# Patient Record
Sex: Male | Born: 1950 | Race: White | Hispanic: No | Smoking: Current every day smoker
Health system: Southern US, Community
[De-identification: ages and names within clinical notes are randomized; demographics above are authoritative.]

## PROBLEM LIST (undated history)

## (undated) DIAGNOSIS — I10 Essential (primary) hypertension: Secondary | ICD-10-CM

---

## 2004-08-29 ENCOUNTER — Ambulatory Visit: Payer: Self-pay | Admitting: Cardiology

## 2005-07-30 ENCOUNTER — Ambulatory Visit: Payer: Self-pay | Admitting: Cardiology

## 2005-07-30 ENCOUNTER — Inpatient Hospital Stay (HOSPITAL_COMMUNITY): Admission: AD | Admit: 2005-07-30 | Discharge: 2005-08-01 | Payer: Self-pay | Admitting: Internal Medicine

## 2007-01-07 ENCOUNTER — Inpatient Hospital Stay (HOSPITAL_COMMUNITY): Admission: RE | Admit: 2007-01-07 | Discharge: 2007-01-09 | Payer: Self-pay | Admitting: Neurosurgery

## 2010-09-17 NOTE — Op Note (Signed)
[name], [name] NO.:  [id number]   MEDICAL RECORD NO.:  [id number]          PATIENT TYPE:  INP   LOCATION:  3027                         FACILITY:  [facility]   PHYSICIAN:  Cristi Loron, M.D.DATE OF BIRTH:  [birth year]   DATE OF PROCEDURE:  01/07/2007  DATE OF DISCHARGE:  01/09/2007                               OPERATIVE REPORT   BRIEF HISTORY:  The patient is a 60 year old white male who suffered  from neck, right shoulder, and right arm pain.  He failed medical  management and was worked up with a cervical and shoulder MRI. The  shoulder MRI demonstrated he had a severe rotator cuff tear and he saw  an orthopedic surgeon.  The patient also had neck pain and radicular arm  pain which seemed consistent with a cervical radiculopathy.  He had an  MRI scan which demonstrated spondylosis and stenosis at C5-C6 and C6-C7.  I discussed the various treatment options with him including surgery.  The patient has weighed the risks, benefits and alternatives and decided  to proceed with a C5-C6 and C6-C7 anterior cervical discectomy, fusion,  and plating.   PREOPERATIVE DIAGNOSIS:  C5-C6 and C6-C7 degenerative disc disease,  spondylosis, stenosis, cervical radiculopathy, cervical myelopathy,  cervicalgia.   POSTOPERATIVE DIAGNOSIS:  C5-C6 and C6-C7 degenerative disc disease,  spondylosis, stenosis, cervical radiculopathy, cervical myelopathy,  cervicalgia.   PROCEDURE:  C5-C6 and C6-C7 extensive anterior cervical  discectomy/decompression; insertion of C5-C6 and C6-C7 interbody  prosthesis (Alphatec PEEK interbody prosthesis); C5-C6 and C6-C7  interbody local morselized autograft arthrodesis; anterior cervical  plate C5 to C7 with Codman Slimlock titanium plate and screws.   SURGEON:  Cristi Loron, M.D.   ASSISTANT:  Hilda Lias, M.D.   ANESTHESIA:  General endotracheal anesthesia.   ESTIMATED BLOOD LOSS:  100 mL.   SPECIMENS:  None.   DRAINS:   None.   COMPLICATIONS:  None.   DESCRIPTION OF PROCEDURE:  The patient was brought to the operating room  by the anesthesia team.  General endotracheal anesthesia was induced.  The patient remained in the supine position.  A roll was placed under  his shoulders to place his neck in slight extension.  His anterior  cervical region was then prepared with Betadine scrub and Betadine  solution.  Sterile drapes were applied.  I then injected the area to be  incised with Marcaine with epinephrine solution and used a scalpel to  make a transverse incision in the patient's left anterior neck.  I used  the Metzenbaum scissors to divide the platysma muscle and then to  dissect medial to the sternocleidomastoid muscle, jugular vein and  carotid artery.  I carefully dissected down towards the anterior  cervical spine carefully identifying the esophagus and retracting it  medially.  We then used the Kitner swabs to clear the soft tissue from  the anterior cervical spine and then inserted a bent spinal needle into  upper exposed intervertebral disc space.  We obtained an intraoperative  radiograph to confirm our location.   We then used electrocautery to detach the medial border of  the longus  colli muscle bilaterally from C5-C6 and C6-C7 intervertebral disc space.  We inserted the Caspar self-retaining retractor for exposure and then  incised the C6-C7 intervertebral disc and performed a partial  intervertebral discectomy using pituitary forceps and Carlen curets.  I  then inserted distraction screws into C6-C7, distracted the interspace,  and then used a high speed drill to decorticate the vertebral endplates  at C6-C7, drill away the remainder of the C6-C7 intervertebral disc,  drill away some posterior spondylosis, and then to thin out the  posterior longitudinal ligament.  We then incised the ligament with the  arachnoid knife and removed it with the Kerrison punch undercutting the  vertebral  endplates at C6-C7 decompressing the thecal sac.  We then  performed foraminotomies about the bilateral C7 nerve root completing  the decompression.   We then repeated this procedure in analogous fashion at C5-C6  decompressing the thecal sac and bilateral C6 nerve roots.   Having completed the decompression, we now turned attention to  arthrodesis.  We used the trial spacers and determined to use the  Alphatec medium PEEK interbody spacers.  We selected the appropriate  height.  We prefilled these spacers with a combination of local  autograft bone we obtained during the decompression as well as the  VITOSS graft extender.  We then inserted the prosthesis, distracted the  C6-C7 and C5-C6 interspaces, then removed the distraction screws.  There  was a good snug fit of the prosthesis at both levels.  This completed  the arthrodesis.   We now turned attention to the anterior spinal instrumentation.  We used  a high speed drill to remove some ventral spondylosis from the vertebral  endplates at C5-C6 and C6-C7 so that the plate would lay down flat.  We  selected the appropriate length Codman Slimlock anterior cervical plate  and laid it along the anterior aspects of the vertebral bodies of C5  through C7.  We then drilled two 14 mm drill holes in C5, C6, and C7,  and then secured the plate to the vertebral bodies by placing two 14 mm  self-tapping screws at C5, C6 and C7.  I then obtained an intraoperative  radiograph.  There was limited visualization because of the patient's  body habitus but they looked good en vivo.  We, therefore, secured the  screws to the plate by locking each cam.  This completed the  instrumentation.   We then obtained hemostasis using bipolar cautery, irrigated the wound  out with bacitracin solution, removed the retractor, and then inspected  the esophagus for any damage and there was none apparent.  We then  reapproximated the patient's platysma muscle with  interrupted 3-0 Vicryl  suture, subcutaneous tissue with interrupted 3-0 Vicryl suture and the  skin with Steri-Strips and Benzoin.  The wound was then coated with  bacitracin ointment.  A sterile dressing was applied.  The drapes were  removed.  The patient was subsequently extubated by the anesthesia team  and transported to the post anesthesia care unit in stable condition.  All sponge, instrument and needle counts were correct at the end of the  case.      Cristi Loron, M.D.  Electronically Signed     JDJ/MEDQ  D:  01/09/2007  T:  01/09/2007  Job:  [id number]

## 2010-09-20 NOTE — Discharge Summary (Signed)
[name], [name] NO.:  [id number]   MEDICAL RECORD NO.:  [id number]          PATIENT TYPE:  INP   LOCATION:  3027                         FACILITY:  [facility]   PHYSICIAN:  Cristi Loron, M.D.DATE OF BIRTH:  1951-01-25   DATE OF ADMISSION:  01/07/2007  DATE OF DISCHARGE:  01/09/2007                               DISCHARGE SUMMARY   BRIEF HISTORY:  The patient is a 60 year old white male who has suffered  from neck, right shoulder, and arm pain.  He failed medical management  and worked up with a cervical MRI and shoulder MRI.  The shoulder MRI  demonstrated severe rotator cuff tear and he saw an orthopedic surgeon.  The patient has also had neck pain with radicular pain, which was  consistent with a cervical radiculopathy.  He had an MRI scan, which  demonstrated spondylosis and stenosis C5-6 and C6-7.  I discussed  various treatments options with the patient including surgery.  The  patient has weighed the risks, benefits, and alternatives of the surgery  and has decided to proceed with C5-6 and C6-7 anterior cervical  discectomy, fusion, and plating.   For further details of this admission, please refer to the typed history  and physical.   HOSPITAL COURSE:  I admitted the patient to [facility] on  January 07, 2007.  On the day of admission, I performed a C5-6, and C6-  7 anterior cervical discectomy, fusion, and plating.  The surgery went  well (for full details of this operation, please refer to typed  operative note).   POSTOPERATIVE COURSE:  The patient's postoperative course was remarkable  only as follows.  He had a fever to 101.7.  We checked a UA, C&S, chest  x-ray, and blood cultures and none of which demonstrated anything  concerning and he was therefore discharged home on postoperative day 2  on January 09, 2007.   DISCHARGE INSTRUCTIONS:  The patient was given written discharge  instructions to follow up with me in 4 weeks.   FINAL DIAGNOSES:  A C5-6 and C6-7 degenerative disc disease,  spondylosis, stenosis, cervical radiculopathy, cervicalgia, and cervical  myelopathy.   PROCEDURE PERFORMED:  A C5-6 and C6-7 extensive anterior cervical  discectomy/decompression; insertion of a C5-6 and C6-7 interbody  prosthesis (Alphatec PEEK interbody prosthesis); C5-6 and C6-7 interbody  local morselized autograft arthrodesis; anterior cervical plating C5-6  and C6-7 with Codman Slim-Loc titanium plate and screws.      Cristi Loron, M.D.  Electronically Signed     JDJ/MEDQ  D:  02/17/2007  T:  02/18/2007  Job:  [id number]

## 2010-09-20 NOTE — Cardiovascular Report (Signed)
[name], [name] NO.:  [id number]   MEDICAL RECORD NO.:  [id number]          PATIENT TYPE:  INP   LOCATION:  3733                         FACILITY:  [facility]   PHYSICIAN:  Salvadore Farber, M.D. LHCDATE OF BIRTH:  1950/12/15   DATE OF PROCEDURE:  07/31/2005  DATE OF DISCHARGE:  08/01/2005                              CARDIAC CATHETERIZATION   PROCEDURE:  Coronary angiography, abdominal aortography, left heart  catheterization, left ventriculography.   INDICATIONS:  [name] is a 60 year old gentleman with hypertension who  presents with multiple episodes of chest discomfort.  He underwent cardiac  CT a year ago which was normal.  However, he has continued to be troubled by  chest discomfort.  The patient and Dr. Olena Leatherwood request a cardiac  catheterization for definitive exclusion of coronary disease as the cause of  his chest pain.   PROCEDURAL TECHNIQUE:  Informed consent was obtained.  Under 1% lidocaine  local anesthesia, a 5-French sheath was placed in the right common femoral  artery using the modified Seldinger technique.  Diagnostic angiography was  performed using JL-4 and JR-4 catheters.  Left heart catheterization and  ventriculography were performed using pigtail catheter.  The pigtail  catheter was then pulled back to the suprarenal abdominal aorta.  Abdominal  aortography was performed by power injection.  The patient tolerated the  procedure well was transferred to holding room.  Sheath will be removed  there.   COMPLICATIONS:  None.   FINDINGS:  1.  LV:  181/10/24.  EF 65% without regional wall motion abnormality.  2.  No aortic stenosis or mitral regurgitation.  3.  Left main:  Angiographically normal.  4.  LAD:  Large vessel giving rise to a single large diagonal.  It is      angiographically normal.  5.  Circumflex:  Large vessel giving rise to three obtuse marginals and a      posterior left ventricular branch.  It is angiographically  normal.  6.  RCA:  Moderate-sized dominant vessel.  It is angiographically normal.  7.  Abdominal aorta:  No evidence of plaque formation or aneurysm.  8.  Renal arteries:  Single vessels bilaterally.  Both were normal.   IMPRESSION/PLAN:  The patient has angiographically normal coronary and renal  arteries.  His chest discomfort may be due to his severe hypertension.  We  will work on the medical management of this.  Discussed with Dr. Olena Leatherwood.      Salvadore Farber, M.D. [facility]  Electronically Signed     WED/MEDQ  D:  07/31/2005  T:  08/01/2005  Job:  [id number]   cc:   Willa Rough, M.D.  [address]  [address]  [place]  [place] [address]   Lia Hopping  Fax: [phone]

## 2010-09-20 NOTE — Discharge Summary (Signed)
NAMENOSSON, WENDER NO.:  [id number]   MEDICAL RECORD NO.:  [id number]          PATIENT TYPE:  INP   LOCATION:  3733                         FACILITY:  [facility]   PHYSICIAN:  Willa Rough, M.D.     DATE OF BIRTH:  03-29-1951   DATE OF ADMISSION:  07/30/2005  DATE OF DISCHARGE:                                 DISCHARGE SUMMARY   PRINCIPAL DIAGNOSIS:  Atypical chest pain with acute hypertensive episode.   SECONDARY DIAGNOSES:  1.  Anxiety with a history of panic attacks which definitely exacerbates his      blood pressure.  2.  Non-frequent PVCs.  3.  History of bradycardia __________.   ____________ External tightness and radiation to __________tingling.  The  patient has presented to [facility] with these symptoms.  Because of  this and because of the patient's symptoms, Dr. Sharyn Lull wanted to have the  patient kept as well as the wife and patient.  The patient was transferred  to [facility] for evaluation of his chest pain on July 30, 2005.   HOSPITAL COURSE:  On July 30, 2005, the patient was admitted to [facility]  and was started on Lovenox.  His blood pressure during hospitalization had  risen to 137/100 with a headache, and Dr. __________ increased the patient's  Lopressor.  On July 31, 2005, Dr. Samule Ohm performed a coronary angiogram and  abdominal angiogram with left ventriculogram which showed normal coronaries  and renal arteries with normal ejection fraction of 65% and normal renal  arteries.  The patient did continue to complain of midsternal nonradiating  chest pain and stated that on July 22, 2005 that he continued to have some  dizziness and bilateral arm tingling and chest pressure.  Dr. Myrtis Ser in to see  and examined the patient. Because of his heart catheterization revealing  normal coronary and renal arteries, Dr. Myrtis Ser deemed that the chest pain was  not cardiac and the patient was to be discharged home.   DISCHARGE LABORATORY DATA:   White blood cell count 7.1, hemoglobin 12.9,  hematocrit 37.2, platelet count 208.  Cholesterol 120, HDL 30, LDL 69,  triglycerides 103.   DISCHARGE INSTRUCTIONS:  The patient was instructed not to lift anything  heavier than 10 pounds for two days and to call if he has any problems with  his groin sites.   FOLLOW UP:  The patient will follow up with his primary care physician.  The  patient is to call and scheduled that visit.   DISCHARGE MEDICATIONS:  1.  Lopressor 25 mg b.i.d.  2.  Diltiazem 360 mg daily.  3.  Plavix 75 mg daily.   DURATION OF DISCHARGE ENCOUNTER:  Less than 30 minutes.     ______________________________  April Humphrey, NP    ______________________________  Willa Rough, M.D.    AH/MEDQ  D:  08/01/2005  T:  08/03/2005  Job:  [id number]

## 2011-02-14 LAB — URINALYSIS, ROUTINE W REFLEX MICROSCOPIC
Glucose, UA: NEGATIVE
Leukocytes, UA: NEGATIVE
Nitrite: NEGATIVE
Specific Gravity, Urine: 1.017
pH: 7

## 2011-02-14 LAB — CBC
Hemoglobin: 15
MCHC: 34.1
MCV: 85.8
RBC: 5.13
RDW: 12.6

## 2011-02-14 LAB — BASIC METABOLIC PANEL
CO2: 26
Calcium: 9.4
Chloride: 107
GFR calc Af Amer: >60
Glucose, Bld: 83
Sodium: 140

## 2011-02-14 LAB — URINE CULTURE: Special Requests: NEGATIVE

## 2011-02-14 LAB — URINE MICROSCOPIC-ADD ON

## 2011-02-14 LAB — CULTURE, BLOOD (ROUTINE X 2): Culture: NO GROWTH

## 2011-08-07 DIAGNOSIS — I10 Essential (primary) hypertension: Secondary | ICD-10-CM | POA: Diagnosis not present

## 2013-05-01 ENCOUNTER — Inpatient Hospital Stay (HOSPITAL_COMMUNITY)
Admission: EM | Admit: 2013-05-01 | Discharge: 2013-05-06 | DRG: 064 | Disposition: A | Discharge status: Rehabilitation Facility | Payer: Medicare Other | Attending: Neurology | Admitting: Neurology

## 2013-05-01 ENCOUNTER — Encounter (HOSPITAL_COMMUNITY): Payer: Self-pay | Admitting: *Deleted

## 2013-05-01 ENCOUNTER — Emergency Department (HOSPITAL_COMMUNITY): Payer: Medicare Other

## 2013-05-01 DIAGNOSIS — R0603 Acute respiratory distress: Secondary | ICD-10-CM | POA: Diagnosis not present

## 2013-05-01 DIAGNOSIS — Z5189 Encounter for other specified aftercare: Secondary | ICD-10-CM | POA: Diagnosis not present

## 2013-05-01 DIAGNOSIS — R935 Abnormal findings on diagnostic imaging of other abdominal regions, including retroperitoneum: Secondary | ICD-10-CM | POA: Diagnosis not present

## 2013-05-01 DIAGNOSIS — G4733 Obstructive sleep apnea (adult) (pediatric): Secondary | ICD-10-CM | POA: Diagnosis present

## 2013-05-01 DIAGNOSIS — D72829 Elevated white blood cell count, unspecified: Secondary | ICD-10-CM | POA: Diagnosis present

## 2013-05-01 DIAGNOSIS — I10 Essential (primary) hypertension: Secondary | ICD-10-CM | POA: Diagnosis not present

## 2013-05-01 DIAGNOSIS — Z4682 Encounter for fitting and adjustment of non-vascular catheter: Secondary | ICD-10-CM | POA: Diagnosis not present

## 2013-05-01 DIAGNOSIS — R0609 Other forms of dyspnea: Secondary | ICD-10-CM | POA: Diagnosis not present

## 2013-05-01 DIAGNOSIS — F17200 Nicotine dependence, unspecified, uncomplicated: Secondary | ICD-10-CM | POA: Diagnosis present

## 2013-05-01 DIAGNOSIS — I619 Nontraumatic intracerebral hemorrhage, unspecified: Principal | ICD-10-CM | POA: Diagnosis present

## 2013-05-01 DIAGNOSIS — R1314 Dysphagia, pharyngoesophageal phase: Secondary | ICD-10-CM

## 2013-05-01 DIAGNOSIS — G459 Transient cerebral ischemic attack, unspecified: Secondary | ICD-10-CM | POA: Diagnosis not present

## 2013-05-01 DIAGNOSIS — R1310 Dysphagia, unspecified: Secondary | ICD-10-CM | POA: Diagnosis present

## 2013-05-01 DIAGNOSIS — R4701 Aphasia: Secondary | ICD-10-CM | POA: Diagnosis present

## 2013-05-01 DIAGNOSIS — G8190 Hemiplegia, unspecified affecting unspecified side: Secondary | ICD-10-CM | POA: Diagnosis present

## 2013-05-01 DIAGNOSIS — E871 Hypo-osmolality and hyponatremia: Secondary | ICD-10-CM | POA: Diagnosis present

## 2013-05-01 DIAGNOSIS — J984 Other disorders of lung: Secondary | ICD-10-CM | POA: Diagnosis not present

## 2013-05-01 DIAGNOSIS — I629 Nontraumatic intracranial hemorrhage, unspecified: Secondary | ICD-10-CM | POA: Diagnosis not present

## 2013-05-01 DIAGNOSIS — E876 Hypokalemia: Secondary | ICD-10-CM | POA: Diagnosis present

## 2013-05-01 DIAGNOSIS — I6350 Cerebral infarction due to unspecified occlusion or stenosis of unspecified cerebral artery: Secondary | ICD-10-CM

## 2013-05-01 DIAGNOSIS — G936 Cerebral edema: Secondary | ICD-10-CM | POA: Diagnosis present

## 2013-05-01 DIAGNOSIS — R0602 Shortness of breath: Secondary | ICD-10-CM | POA: Diagnosis not present

## 2013-05-01 DIAGNOSIS — G8100 Flaccid hemiplegia affecting unspecified side: Secondary | ICD-10-CM | POA: Diagnosis not present

## 2013-05-01 DIAGNOSIS — I6789 Other cerebrovascular disease: Secondary | ICD-10-CM | POA: Diagnosis not present

## 2013-05-01 DIAGNOSIS — R7881 Bacteremia: Secondary | ICD-10-CM | POA: Diagnosis not present

## 2013-05-01 DIAGNOSIS — E785 Hyperlipidemia, unspecified: Secondary | ICD-10-CM | POA: Diagnosis present

## 2013-05-01 DIAGNOSIS — I059 Rheumatic mitral valve disease, unspecified: Secondary | ICD-10-CM | POA: Diagnosis not present

## 2013-05-01 DIAGNOSIS — Z720 Tobacco use: Secondary | ICD-10-CM | POA: Diagnosis present

## 2013-05-01 HISTORY — DX: Essential (primary) hypertension: I10

## 2013-05-01 LAB — COMPREHENSIVE METABOLIC PANEL
AST: 18 U/L (ref 0–37)
Albumin: 3.3 g/dL — ABNORMAL LOW (ref 3.5–5.2)
BUN: 11 mg/dL (ref 6–23)
Creatinine, Ser: 1.21 mg/dL (ref 0.50–1.35)
Potassium: 3.2 mEq/L — ABNORMAL LOW (ref 3.5–5.1)
Total Protein: 6.6 g/dL (ref 6.0–8.3)

## 2013-05-01 LAB — CBC
Hemoglobin: 16.6 g/dL (ref 13.0–17.0)
MCH: 31 pg (ref 26.0–34.0)
MCHC: 35.4 g/dL (ref 30.0–36.0)
RDW: 13 % (ref 11.5–15.5)

## 2013-05-01 LAB — URINALYSIS, ROUTINE W REFLEX MICROSCOPIC
Bilirubin Urine: NEGATIVE
Glucose, UA: NEGATIVE mg/dL
Ketones, ur: NEGATIVE mg/dL
Protein, ur: >300 mg/dL — AB

## 2013-05-01 LAB — DIFFERENTIAL
Basophils Absolute: 0 10*3/uL (ref 0.0–0.1)
Basophils Relative: 0 % (ref 0–1)
Eosinophils Absolute: 0.2 10*3/uL (ref 0.0–0.7)
Monocytes Absolute: 0.9 10*3/uL (ref 0.1–1.0)
Monocytes Relative: 10 % (ref 3–12)
Neutrophils Relative %: 73 % (ref 43–77)

## 2013-05-01 LAB — POCT I-STAT, CHEM 8
BUN: 11 mg/dL (ref 6–23)
Calcium, Ion: 1.14 mmol/L (ref 1.13–1.30)
Chloride: 103 mEq/L (ref 96–112)
Creatinine, Ser: 1.3 mg/dL (ref 0.50–1.35)
Glucose, Bld: 126 mg/dL — ABNORMAL HIGH (ref 70–99)
HCT: 50 % (ref 39.0–52.0)
TCO2: 29 mmol/L (ref 0–100)

## 2013-05-01 LAB — RAPID URINE DRUG SCREEN, HOSP PERFORMED
Amphetamines: NOT DETECTED
Benzodiazepines: NOT DETECTED
Cocaine: NOT DETECTED
Opiates: NOT DETECTED
Tetrahydrocannabinol: NOT DETECTED

## 2013-05-01 LAB — TROPONIN I: Troponin I: <0.3 ng/mL (ref <0.30)

## 2013-05-01 LAB — URINE MICROSCOPIC-ADD ON

## 2013-05-01 LAB — PROTIME-INR: INR: 1.04 (ref 0.00–1.49)

## 2013-05-01 LAB — HEMOGLOBIN A1C: Hgb A1c MFr Bld: 5.5 % (ref <5.7)

## 2013-05-01 LAB — MRSA PCR SCREENING: MRSA by PCR: NEGATIVE

## 2013-05-01 LAB — ETHANOL: Alcohol, Ethyl (B): <11 mg/dL (ref 0–11)

## 2013-05-01 LAB — LIPID PANEL
HDL: 47 mg/dL (ref >39)
LDL Cholesterol: 125 mg/dL — ABNORMAL HIGH (ref 0–99)
Triglycerides: 99 mg/dL (ref <150)

## 2013-05-01 LAB — APTT: aPTT: 30 seconds (ref 24–37)

## 2013-05-01 LAB — GLUCOSE, CAPILLARY: Glucose-Capillary: 120 mg/dL — ABNORMAL HIGH (ref 70–99)

## 2013-05-01 MED ORDER — NICARDIPINE HCL IN NACL 20-0.86 MG/200ML-% IV SOLN: 3.0000 mg/h | INTRAVENOUS | Status: DC

## 2013-05-01 MED ORDER — PANTOPRAZOLE SODIUM 40 MG IV SOLR
40.0000 mg | Freq: Every day | INTRAVENOUS | Status: DC
  Administered 2013-05-01 – 2013-05-04 (×4): 40 mg via INTRAVENOUS
  Filled 2013-05-01 (×5): qty 40

## 2013-05-01 MED ORDER — NICARDIPINE HCL IN NACL 20-0.86 MG/200ML-% IV SOLN
5.0000 mg/h | Freq: Once | INTRAVENOUS | Status: AC
  Administered 2013-05-01: 5 mg/h via INTRAVENOUS
  Filled 2013-05-01: qty 200

## 2013-05-01 MED ORDER — ACETAMINOPHEN 325 MG PO TABS: 650.0000 mg | ORAL_TABLET | ORAL | Status: DC | PRN

## 2013-05-01 MED ORDER — ACETAMINOPHEN 650 MG RE SUPP
650.0000 mg | RECTAL | Status: DC | PRN
  Administered 2013-05-02 – 2013-05-03 (×2): 650 mg via RECTAL
  Filled 2013-05-01 (×2): qty 1

## 2013-05-01 MED ORDER — NICARDIPINE HCL IN NACL 40-0.83 MG/200ML-% IV SOLN
3.0000 mg/h | INTRAVENOUS | Status: DC
Start: 2013-05-01 — End: 2013-05-03
  Administered 2013-05-01: 8 mg/h via INTRAVENOUS
  Administered 2013-05-01: 2 mg/h via INTRAVENOUS
  Administered 2013-05-01: 10 mg/h via INTRAVENOUS
  Administered 2013-05-02: 8 mg/h via INTRAVENOUS
  Administered 2013-05-02: 4 mg/h via INTRAVENOUS
  Administered 2013-05-02: 9 mg/h via INTRAVENOUS
  Administered 2013-05-03: 7 mg/h via INTRAVENOUS
  Administered 2013-05-03 (×2): 9 mg/h via INTRAVENOUS
  Filled 2013-05-01 (×9): qty 200

## 2013-05-01 MED ORDER — SODIUM CHLORIDE 0.9 % IV SOLN
INTRAVENOUS | Status: DC
  Administered 2013-05-01: 15:00:00 via INTRAVENOUS
  Administered 2013-05-02: 75 mL/h via INTRAVENOUS
  Administered 2013-05-02: 18:00:00 via INTRAVENOUS
  Administered 2013-05-03: 1000 mL via INTRAVENOUS

## 2013-05-01 NOTE — ED Provider Notes (Signed)
CSN: [id number]     Arrival date & time 05/01/13  0518 History   First MD Initiated Contact with Patient 05/01/13 [phone]     Chief Complaint  Patient presents with  . Cerebrovascular Accident   (Consider location/radiation/quality/duration/timing/severity/associated sxs/prior Treatment) HPI Comments: Level 5 caveat applies secondary to Altered MS and severe stroke  Pt arrives by EMS with severe htn - found on the ground by family (wife) in the living room - unable to talk or to move his R side.  EMS had pressure > 250/140, ransported without change in condition - last seen normal at 9:30 PM last night.  No vomiting, pt unable to talk.   Patient is a 62 y.o. male presenting with Acute Neurological Problem. The history is provided by the EMS personnel.  Cerebrovascular Accident    No past medical history on file. No past surgical history on file. No family history on file. History  Substance Use Topics  . Smoking status: Not on file  . Smokeless tobacco: Not on file  . Alcohol Use: Not on file    Review of Systems  Unable to perform ROS: Acuity of condition    Allergies  Review of patient's allergies indicates no known allergies.  Home Medications  No current outpatient prescriptions on file. BP 222/153  Pulse 72  Temp(Src) 97.6 F (36.4 C) (Oral)  Resp 19  Ht 5\' 10"  (1.778 m)  Wt 199 lb 1.2 oz (90.3 kg)  BMI 28.56 kg/m2  SpO2 96% Physical Exam  Nursing note and vitals reviewed. Constitutional: He appears well-developed and well-nourished. He appears distressed.  HENT:  Head: Normocephalic and atraumatic.  Mouth/Throat: Oropharynx is clear and moist. No oropharyngeal exudate.  Eyes: Conjunctivae are normal. Pupils are equal, round, and reactive to light. Right eye exhibits no discharge. Left eye exhibits no discharge. No scleral icterus.  Neck: Normal range of motion. Neck supple. No JVD present. No thyromegaly present.  Cardiovascular: Normal rate, regular rhythm,  normal heart sounds and intact distal pulses.  Exam reveals no gallop and no friction rub.   No murmur heard. No carotid bruit  Pulmonary/Chest: Effort normal and breath sounds normal. No respiratory distress. He has no wheezes. He has no rales.  Abdominal: Soft. Bowel sounds are normal. He exhibits no distension and no mass. There is no tenderness.  Musculoskeletal: Normal range of motion. He exhibits no edema and no tenderness.  Lymphadenopathy:    He has no cervical adenopathy.  Neurological: He is alert.  L hand - can grip and can wiggle L toes - Dense R hemiparesis - no speech - can't follow commands well.  Skin: Skin is warm and dry. No rash noted. No erythema.  Psychiatric: He has a normal mood and affect. His behavior is normal.    ED Course  Procedures (including critical care time) Labs Review Labs Reviewed  COMPREHENSIVE METABOLIC PANEL - Abnormal; Notable for the following:    Potassium 3.2 (*)    Glucose, Bld 125 (*)    Albumin 3.3 (*)    GFR calc non Af Amer 62 (*)    GFR calc Af Amer 72 (*)    All other components within normal limits  URINALYSIS, ROUTINE W REFLEX MICROSCOPIC - Abnormal; Notable for the following:    Hgb urine dipstick SMALL (*)    Protein, ur >300 (*)    All other components within normal limits  GLUCOSE, CAPILLARY - Abnormal; Notable for the following:    Glucose-Capillary 120 (*)  All other components within normal limits  URINE MICROSCOPIC-ADD ON - Abnormal; Notable for the following:    Casts HYALINE CASTS (*)    All other components within normal limits  POCT I-STAT, CHEM 8 - Abnormal; Notable for the following:    Glucose, Bld 126 (*)    All other components within normal limits  MRSA PCR SCREENING  ETHANOL  PROTIME-INR  APTT  CBC  DIFFERENTIAL  TROPONIN I  URINE RAPID DRUG SCREEN (HOSP PERFORMED)  POCT I-STAT TROPONIN I   Imaging Review Ct Head Wo Contrast  05/01/2013   CLINICAL DATA:  Right-sided weakness and altered mental  status.  EXAM: CT HEAD WITHOUT CONTRAST  TECHNIQUE: Contiguous axial images were obtained from the base of the skull through the vertex without intravenous contrast.  COMPARISON:  MRI of the brain performed 10/02/2006  FINDINGS: There is a large 4.6 x 3.9 cm intraparenchymal hematoma noted at the left parietal lobe, adjacent to the left basal ganglia and thalamus. There is extension of blood into the left lateral ventricle. Mild associated vasogenic edema is seen, though there is minimal if any midline shift at this time. There is slight effacement of the left lateral ventricle, without evidence for obstruction.  Relatively diffuse periventricular and subcortical white matter change likely reflects small vessel ischemic microangiopathy. A chronic lacunar infarct is noted within the inferior right basal ganglia. The posterior fossa, including the cerebellum, brainstem and fourth ventricle, is within normal limits.  There is no evidence of fracture; visualized osseous structures are unremarkable in appearance. The orbits are within normal limits. Mild mucosal thickening is noted within the maxillary sinuses; the remaining paranasal sinuses and mastoid air cells are well-aerated. No significant soft tissue abnormalities are seen.  IMPRESSION: 1. Large 4.6 x 3.9 cm intraparenchymal hematoma at the left parietal lobe, adjacent to the left basal ganglia and thalamus. Extension of blood into the left lateral ventricle, with mild associated vasogenic edema. There is minimal if any midline shift at this time. Slight effacement of the left lateral ventricle, without evidence for obstruction. 2. Diffuse small vessel ischemic microangiopathy; chronic lacunar infarct within the inferior right basal ganglia. 3. Mild mucosal thickening within the maxillary sinuses.  Critical Value/emergent results were called by telephone at the time of interpretation on 05/01/2013 at 5:35 AM to Dr. Eber Hong , who verbally acknowledged these  results.   Electronically Signed   By: Roanna Raider M.D.   On: 05/01/2013 05:37     MDM   1. Intraparenchymal hemorrhage of brain   2. Severe hypertension    The pt has severe neurological symptoms c/w stroke - CT confirms stroke and htn is likely the cause - labs ordered, Neurology stat consult and Dr. Roseanne Reno will admit.  I will start cardene constant infusrion drip for the severe htn, monitoring and ICU admission.  Mild hyponatremia - no other acute lab findings.  Pt has had ongoing neuro sx without improvement despite improved blood pressure.   Pt is critically ill - required multiple reevluation and titratable meds with specialist consulation and ICU admissin.  CRITICAL CARE Performed by: Vida Roller Total critical care time: 30 Critical care time was exclusive of separately billable procedures and treating other patients. Critical care was necessary to treat or prevent imminent or life-threatening deterioration. Critical care was time spent personally by me on the following activities: development of treatment plan with patient and/or surrogate as well as nursing, discussions with consultants, evaluation of patient's response to treatment, examination of  patient, obtaining history from patient or surrogate, ordering and performing treatments and interventions, ordering and review of laboratory studies, ordering and review of radiographic studies, pulse oximetry and re-evaluation of patient's condition.     Vida Roller, MD 05/01/13 [phone]

## 2013-05-01 NOTE — ED Notes (Signed)
CBG-120. Notified RN 

## 2013-05-01 NOTE — H&P (Signed)
Admission H&P    Chief Complaint: New-onset expressive aphasia and right-sided weakness.   HPI: [name] is an 62 y.o. male history of hypertension who was found on the floor of his home early this morning with speech difficulty and unable to move his right side. He was last seen normal at 9:30 PM on 04/30/2013. CT scan of his head showed an 8 cm left parietal hemorrhage with extension into left lateral ventricle. There was mild mass effect as well as mild vasogenic edema. Blood pressure was markedly elevated at 247/132. Cardene drip was started for blood pressure management. NIH stroke score was 22.  LSN: 9:30 PM on 04/30/2013 tPA Given: No: Acute ICH MRankin: 4  No past medical history on file.  No past surgical history on file.  No family history on file. Social History:  has no tobacco, alcohol, and drug history on file.  Allergies: No Known Allergies   (Not in a hospital admission)  ROS: Unavailable.  Physical Examination: Blood pressure 247/132, pulse 75, temperature 98.2 F (36.8 C), temperature source Oral, resp. rate 16, weight 86.41 kg (190 lb 8 oz), SpO2 99.00%.  HEENT-  Normocephalic, no lesions, without obvious abnormality.  Normal external eye and conjunctiva.  Normal TM's bilaterally.  Normal auditory canals and external ears. Normal external nose, mucus membranes and septum.  Normal pharynx. Neck supple with no masses, nodes, nodules or enlargement. Cardiovascular - regular rate and rhythm, S1, S2 normal, no murmur, click, rub or gallop Lungs - chest clear, no wheezing, rales, normal symmetric air entry, Heart exam - S1, S2 normal, no murmur, no gallop, rate regular Abdomen - soft, non-tender; bowel sounds normal; no masses,  no organomegaly Extremities - no joint deformities, effusion, or inflammation and no edema  Neurologic Examination: Mental Status: Lethargic and in no acute distress. Marked expressive aphasia with nonsensical speech output.  Able to  follow commands with use of left extremities. Patient tended to neglect his right side for the most part. Cranial Nerves: II-dense right homonymous hemianopsia. III/IV/VI-Pupils were equal and reacted. Gaze preference to the left side.    V/VII-reduced perception of tactile sensation over right side of his face compared to left; mild to moderate right lower facial weakness. VIII-normal. X-moderately severe dysarthria. Motor: No voluntary movement of right upper and lower extremities with flaccid muscle tone, as well as no spontaneous movements; normal strength of left extremities. Sensory: No response to noxious stimuli of left extremities. Deep Tendon Reflexes: 2+ and symmetric. Plantars: Extensor on the right and flexor on the left. Cerebellar: Unable to perform, including left upper extremity with difficulty understanding the commands. Carotid auscultation: Normal  No results found for this or any previous visit (from the past 48 hour(s)). Ct Head Wo Contrast  05/01/2013   CLINICAL DATA:  Right-sided weakness and altered mental status.  EXAM: CT HEAD WITHOUT CONTRAST  TECHNIQUE: Contiguous axial images were obtained from the base of the skull through the vertex without intravenous contrast.  COMPARISON:  MRI of the brain performed 10/02/2006  FINDINGS: There is a large 4.6 x 3.9 cm intraparenchymal hematoma noted at the left parietal lobe, adjacent to the left basal ganglia and thalamus. There is extension of blood into the left lateral ventricle. Mild associated vasogenic edema is seen, though there is minimal if any midline shift at this time. There is slight effacement of the left lateral ventricle, without evidence for obstruction.  Relatively diffuse periventricular and subcortical white matter change likely reflects small vessel ischemic microangiopathy.  A chronic lacunar infarct is noted within the inferior right basal ganglia. The posterior fossa, including the cerebellum, brainstem and  fourth ventricle, is within normal limits.  There is no evidence of fracture; visualized osseous structures are unremarkable in appearance. The orbits are within normal limits. Mild mucosal thickening is noted within the maxillary sinuses; the remaining paranasal sinuses and mastoid air cells are well-aerated. No significant soft tissue abnormalities are seen.  IMPRESSION: 1. Large 4.6 x 3.9 cm intraparenchymal hematoma at the left parietal lobe, adjacent to the left basal ganglia and thalamus. Extension of blood into the left lateral ventricle, with mild associated vasogenic edema. There is minimal if any midline shift at this time. Slight effacement of the left lateral ventricle, without evidence for obstruction. 2. Diffuse small vessel ischemic microangiopathy; chronic lacunar infarct within the inferior right basal ganglia. 3. Mild mucosal thickening within the maxillary sinuses.  Critical Value/emergent results were called by telephone at the time of interpretation on 05/01/2013 at 5:35 AM to Dr. Eber Hong , who verbally acknowledged these results.   Electronically Signed   By: Roanna Raider M.D.   On: 05/01/2013 05:37    Assessment: 62 y.o. male presenting with acute large left parietal parenchymal hemorrhage with severe aphasia and right hemiplegia, as well as hypertensive urgency.  Stroke Risk Factors - hypertension  Plan: 1. Repeat CT of the head and 12-18 hours 2. MRI, MRA  of the brain without contrast 3. PT consult, OT consult, Speech consult 4. Echocardiogram 5. Carotid dopplers 6. Prophylactic therapy-None 7. Risk factor modification 8. HgbA1c, fasting lipid panel  C.R. Roseanne Reno, MD [facility] [phone]  05/01/2013, 6:02 AM

## 2013-05-01 NOTE — Evaluation (Addendum)
Clinical/Bedside Swallow Evaluation Patient Details  Name: [name] MRN: [id number] Date of Birth: 09-Sep-1950  Today's Date: 05/01/2013 Time: 1100-1134 SLP Time Calculation (min): 34 min  Past Medical History: No past medical history on file. Past Surgical History: No past surgical history on file. HPI:  62 y.o. male history of hypertension who was found on the floor of his home early this morning with speech difficulty and unable to move his right side. He was last seen normal at 9:30 PM on 04/30/2013. CT scan of his head showed an 8 cm left parietal hemorrhage with extension into left lateral ventricle. There was mild mass effect as well as mild vasogenic edema. Blood pressure was markedly elevated at 247/132. Cardene drip was started for blood pressure management. NIH stroke score was 22.  BSE ordered per Stroke Protocol.    Assessment / Plan / Recommendation Clinical Impression  BSE completed but limited secondary to patient presenting with lethargy with inability to maintain LOA to conume PO trials.  Patient would open eyes briefly with noted tremors  UE.  Nursing made aware.  Cognitively unable to complete volitional cough and throat clear.  Recommend to continue NPO status mainly due to decreased LOA with reduced ability to protect airway fully.  ST to reassess swallow bedside to determine dysphagia and PO readiness on 05/02/13.     Aspiration Risk  Severe    Diet Recommendation NPO   Medication Administration: Via alternative means    Other  Recommendations Oral Care Recommendations: Oral care Q4 per protocol Other Recommendations: Have oral suction available   Follow Up Recommendations       Frequency and Duration min 2x/week  2 weeks       SLP Swallow Goals Please refer to Care Plans for listed goals  Swallow Study Prior Functional Status  No prior reports of dysphagia     General Date of Onset: 05/01/13 HPI: 62 y.o. male history of hypertension who was found  on the floor of his home early this morning with speech difficulty and unable to move his right side. He was last seen normal at 9:30 PM on 04/30/2013. CT scan of his head showed an 8 cm left parietal hemorrhage with extension into left lateral ventricle. There was mild mass effect as well as mild vasogenic edema. Blood pressure was markedly elevated at 247/132. Cardene drip was started for blood pressure management. NIH stroke score was 22. Type of Study: Bedside swallow evaluation Diet Prior to this Study: NPO Temperature Spikes Noted: No Respiratory Status: Nasal cannula History of Recent Intubation: No Behavior/Cognition: Lethargic;Distractible;Requires cueing Oral Cavity - Dentition: Poor condition Patient Positioning: Upright in bed Baseline Vocal Quality: Clear Volitional Cough: Cognitively unable to elicit Volitional Swallow: Unable to elicit    Oral/Motor/Sensory Function Overall Oral Motor/Sensory Function: Impaired Labial Strength: Reduced Labial Sensation: Reduced Lingual Strength: Reduced Lingual Sensation: Reduced Facial Strength: Within Functional Limits Facial Sensation: Reduced Velum: Within Functional Limits Mandible: Within Functional Limits   Ice Chips Ice chips: Not tested   Thin Liquid Thin Liquid: Not tested    Nectar Thick Nectar Thick Liquid: Not tested   Honey Thick Honey Thick Liquid: Not tested   Puree Puree: Not tested   Solid   GO    Solid: Not tested      [name] MS, CCC-SLP [phone] [facility] 05/01/2013,11:34 AM

## 2013-05-01 NOTE — Progress Notes (Signed)
Stroke Team Progress Note  HISTORY 62 y.o. male history of hypertension who was found on the floor of his home early this morning with speech difficulty and unable to move his right side. He was last seen normal at 9:30 PM on 04/30/2013. CT scan of his head showed an 8 cm left parietal hemorrhage with extension into left lateral ventricle. There was mild mass effect as well as mild vasogenic edema. Blood pressure was markedly elevated at 247/132. Cardene drip was started for blood pressure management. NIH stroke score was 22.   Patient was not a TPA candidate secondary to hemorrhage. He was admitted to the neuro ICU for further evaluation and treatment.  SUBJECTIVE Expressive aphasia. Plegic R side.   OBJECTIVE Most recent Vital Signs: Filed Vitals:   05/01/13 0615 05/01/13 0630 05/01/13 0645 05/01/13 0734  BP: 228/119 228/126 198/112 222/153  Pulse: 73 71 61 72  Temp:    97.6 F (36.4 C)  TempSrc:    Oral  Resp: 13 19 17 19   Height:    5\' 10"  (1.778 m)  Weight:    90.3 kg (199 lb 1.2 oz)  SpO2: 100% 99% 100% 96%   CBG (last 3)   Recent Labs  05/01/13 0604  GLUCAP 120*    IV Fluid Intake:   . sodium chloride 75 mL/hr at 05/01/13 0745  . niCARDipine      MEDICATIONS  . pantoprazole (PROTONIX) IV  40 mg Intravenous QHS   PRN:  acetaminophen, acetaminophen  Diet:  NPO  Activity:  Bedrest DVT Prophylaxis:  SCDs  CLINICALLY SIGNIFICANT STUDIES Basic Metabolic Panel:  Recent Labs Lab 05/01/13 0558 05/01/13 0605  NA 141 144  K 3.2* 3.5  CL 103 103  CO2 27  --   GLUCOSE 125* 126*  BUN 11 11  CREATININE 1.21 1.30  CALCIUM 8.7  --    Liver Function Tests:  Recent Labs Lab 05/01/13 0558  AST 18  ALT 21  ALKPHOS 75  BILITOT 0.6  PROT 6.6  ALBUMIN 3.3*   CBC:  Recent Labs Lab 05/01/13 0558 05/01/13 0605  WBC 9.0  --   NEUTROABS 6.6  --   HGB 16.6 17.0  HCT 46.9 50.0  MCV 87.7  --   PLT 162  --    Coagulation:  Recent Labs Lab 05/01/13 0558   LABPROT 13.4  INR 1.04   Cardiac Enzymes:  Recent Labs Lab 05/01/13 0558  TROPONINI <0.30   Urinalysis:  Recent Labs Lab 05/01/13 0559  COLORURINE YELLOW  LABSPEC 1.015  PHURINE 7.5  GLUCOSEU NEGATIVE  HGBUR SMALL*  BILIRUBINUR NEGATIVE  KETONESUR NEGATIVE  PROTEINUR >300*  UROBILINOGEN 0.2  NITRITE NEGATIVE  LEUKOCYTESUR NEGATIVE   Lipid Panel No results found for this basename: chol, trig, hdl, cholhdl, vldl, ldlcalc   HgbA1C  No results found for this basename: HGBA1C    Urine Drug Screen:     Component Value Date/Time   LABOPIA NONE DETECTED 05/01/2013 0559   COCAINSCRNUR NONE DETECTED 05/01/2013 0559   LABBENZ NONE DETECTED 05/01/2013 0559   AMPHETMU NONE DETECTED 05/01/2013 0559   THCU NONE DETECTED 05/01/2013 0559   LABBARB NONE DETECTED 05/01/2013 0559    Alcohol Level:  Recent Labs Lab 05/01/13 0558  ETH <11    Ct Head Wo Contrast  05/01/2013   CLINICAL DATA:  Right-sided weakness and altered mental status.  EXAM: CT HEAD WITHOUT CONTRAST  TECHNIQUE: Contiguous axial images were obtained from the base of the skull through the  vertex without intravenous contrast.  COMPARISON:  MRI of the brain performed 10/02/2006  FINDINGS: There is a large 4.6 x 3.9 cm intraparenchymal hematoma noted at the left parietal lobe, adjacent to the left basal ganglia and thalamus. There is extension of blood into the left lateral ventricle. Mild associated vasogenic edema is seen, though there is minimal if any midline shift at this time. There is slight effacement of the left lateral ventricle, without evidence for obstruction.  Relatively diffuse periventricular and subcortical white matter change likely reflects small vessel ischemic microangiopathy. A chronic lacunar infarct is noted within the inferior right basal ganglia. The posterior fossa, including the cerebellum, brainstem and fourth ventricle, is within normal limits.  There is no evidence of fracture; visualized  osseous structures are unremarkable in appearance. The orbits are within normal limits. Mild mucosal thickening is noted within the maxillary sinuses; the remaining paranasal sinuses and mastoid air cells are well-aerated. No significant soft tissue abnormalities are seen.  IMPRESSION: 1. Large 4.6 x 3.9 cm intraparenchymal hematoma at the left parietal lobe, adjacent to the left basal ganglia and thalamus. Extension of blood into the left lateral ventricle, with mild associated vasogenic edema. There is minimal if any midline shift at this time. Slight effacement of the left lateral ventricle, without evidence for obstruction. 2. Diffuse small vessel ischemic microangiopathy; chronic lacunar infarct within the inferior right basal ganglia. 3. Mild mucosal thickening within the maxillary sinuses.  Critical Value/emergent results were called by telephone at the time of interpretation on 05/01/2013 at 5:35 AM to Dr. Eber Hong , who verbally acknowledged these results.   Electronically Signed   By: Roanna Raider M.D.   On: 05/01/2013 05:37    CT of the brain Large 4.6 x 3.9 cm intraparenchymal hematoma at the left parietal  lobe, adjacent to the left basal ganglia and thalamus. Extension of  blood into the left lateral ventricle, with mild associated  vasogenic edema.   MRI of the brain    MRA of the brain    2D Echocardiogram    Carotid Doppler    CXR    EKG  normal EKG, normal sinus rhythm, unchanged from previous tracings.   Therapy Recommendations pending  Physical Exam  Neurologic Examination:  Mental Status:  Lethargic and in no acute distress. Marked expressive aphasia with nonsensical speech output. Able to follow commands with use of left extremities. Patient tended to neglect his right side for the most part.  Cranial Nerves:  II-dense right homonymous hemianopsia.  III/IV/VI-Pupils were equal and reacted. Gaze preference to the left side.  V/VII-reduced perception of tactile  sensation over right side of his face compared to left; mild to moderate right lower facial weakness.  VIII-normal.  X-moderately severe dysarthria.  Motor: No voluntary movement of right upper and lower extremities with flaccid muscle tone, as well as no spontaneous movements; normal strength of left extremities.  Sensory: No response to noxious stimuli of left extremities.  Deep Tendon Reflexes: 2+ and symmetric.  Plantars: Extensor on the right and flexor on the left.  Cerebellar: Unable to perform, including left upper extremity with difficulty understanding the commands.  Carotid auscultation: Normal    ASSESSMENT 62 y.o. male presenting with acute large left parietal parenchymal hemorrhage with severe aphasia and right hemiplegia. Pt has hx of uncontrollled HTN and was not taking his medications as per family   HTN, uncontrolled, not on antihypertensives at home.    Hospital day # 0  TREATMENT/PLAN  nicardipine  for BP <160 will start adding oral agents tomorrow.   protonix  SCDs  Repeat CTH in AM  PT/OT/Speech  Echo  Carotid dopplers  HbA1c, lipid panel.   Discussed with family at bedside.   05/01/2013 9:01 AM Pauletta Browns

## 2013-05-01 NOTE — Progress Notes (Signed)
VASCULAR LAB PRELIMINARY  PRELIMINARY  PRELIMINARY  PRELIMINARY  Carotid duplex  completed.    Preliminary report:  Bilateral:  1-39% ICA stenosis.  Vertebral artery flow is antegrade.      Mena Lienau, RVT 05/01/2013, 2:57 PM

## 2013-05-02 ENCOUNTER — Inpatient Hospital Stay (HOSPITAL_COMMUNITY): Payer: Medicare Other

## 2013-05-02 ENCOUNTER — Encounter (HOSPITAL_COMMUNITY): Payer: Self-pay | Admitting: Radiology

## 2013-05-02 DIAGNOSIS — R0603 Acute respiratory distress: Secondary | ICD-10-CM | POA: Diagnosis not present

## 2013-05-02 DIAGNOSIS — I10 Essential (primary) hypertension: Secondary | ICD-10-CM | POA: Diagnosis present

## 2013-05-02 DIAGNOSIS — R0609 Other forms of dyspnea: Secondary | ICD-10-CM | POA: Diagnosis not present

## 2013-05-02 DIAGNOSIS — J984 Other disorders of lung: Secondary | ICD-10-CM

## 2013-05-02 DIAGNOSIS — I619 Nontraumatic intracerebral hemorrhage, unspecified: Secondary | ICD-10-CM | POA: Diagnosis not present

## 2013-05-02 DIAGNOSIS — I059 Rheumatic mitral valve disease, unspecified: Secondary | ICD-10-CM

## 2013-05-02 DIAGNOSIS — Z720 Tobacco use: Secondary | ICD-10-CM | POA: Diagnosis present

## 2013-05-02 DIAGNOSIS — F17200 Nicotine dependence, unspecified, uncomplicated: Secondary | ICD-10-CM

## 2013-05-02 LAB — POCT I-STAT 3, ART BLOOD GAS (G3+)
Bicarbonate: 24.4 mEq/L — ABNORMAL HIGH (ref 20.0–24.0)
O2 Saturation: 95 %
pH, Arterial: 7.423 (ref 7.350–7.450)
pO2, Arterial: 73 mmHg — ABNORMAL LOW (ref 80.0–100.0)

## 2013-05-02 NOTE — Progress Notes (Signed)
  Echocardiogram 2D Echocardiogram has been performed.  Tanisia Yokley, [employer] 05/02/2013, 11:50 AM

## 2013-05-02 NOTE — Evaluation (Signed)
Physical Therapy Evaluation Patient Details Name: [name] MRN: [id number] DOB: [birth year] Today's Date: 05/02/2013 Time: 4098-1191 PT Time Calculation (min): 23 min  PT Assessment / Plan / Recommendation History of Present Illness  62 y.o. male history of hypertension who was found on the floor of his home early this morning with speech difficulty and unable to move his right side. He was last seen normal at 9:30 PM on 04/30/2013. CT scan of his head showed an 8 cm left parietal hemorrhage with extension into left lateral ventricle. There was mild mass effect as well as mild vasogenic edema. Blood pressure was markedly elevated at 247/132. Cardene drip was started for blood pressure management. NIH stroke score was 22  Clinical Impression  Pt presenting with R sided weakness and impaired sensation in addition to cognitive deficits and expressive language deficits. Pt indep PTA and now requiring maximalA x2 for all mobility. Pt to strongly benefit from CIR upon d/c to achieve maximal functional recovery for safe transition home with spouse.    PT Assessment  Patient needs continued PT services    Follow Up Recommendations  CIR    Does the patient have the potential to tolerate intense rehabilitation      Barriers to Discharge        Equipment Recommendations   (TBD)    Recommendations for Other Services Rehab consult   Frequency Min 4X/week    Precautions / Restrictions Precautions Precautions: Fall Restrictions Weight Bearing Restrictions: No   Pertinent Vitals/Pain Didn't report pain      Mobility  Bed Mobility Bed Mobility: Supine to Sit;Sit to Supine Supine to Sit: 1: +2 Total assist;HOB flat Supine to Sit: Patient Percentage: 20% Sit to Supine: 1: +2 Total assist;HOB flat Sit to Supine: Patient Percentage: 40% Details for Bed Mobility Assistance: Pt initiated with L UE and L LE however required maxA for trunk elevation and to bring hips to  EOB Transfers Transfers: Sit to Stand;Stand to Sit Sit to Stand: 1: +2 Total assist;With upper extremity assist;From bed Sit to Stand: Patient Percentage: 30% Stand to Sit: 1: +2 Total assist;With upper extremity assist;To bed Stand to Sit: Patient Percentage: 30% Details for Transfer Assistance: pt assisted with stand however requires R knee to be blocked due to buckling Ambulation/Gait Ambulation/Gait Assistance: Not tested (comment) Modified Rankin (Stroke Patients Only) Pre-Morbid Rankin Score: No symptoms Modified Rankin: Severe disability    Exercises     PT Diagnosis: Difficulty walking;Hemiplegia dominant side  PT Problem List: Decreased strength;Decreased activity tolerance;Decreased balance;Decreased mobility;Decreased coordination;Decreased cognition;Decreased safety awareness PT Treatment Interventions: DME instruction;Gait training;Stair training;Functional mobility training;Therapeutic activities;Balance training;Therapeutic exercise;Neuromuscular re-education;Cognitive remediation;Patient/family education     PT Goals(Current goals can be found in the care plan section) Acute Rehab PT Goals Patient Stated Goal: didn't report PT Goal Formulation: With patient/family Time For Goal Achievement: 05/16/13 Potential to Achieve Goals: Good  Visit Information  Last PT Received On: 05/02/13 Assistance Needed: +2 History of Present Illness: 62 y.o. male history of hypertension who was found on the floor of his home early this morning with speech difficulty and unable to move his right side. He was last seen normal at 9:30 PM on 04/30/2013. CT scan of his head showed an 8 cm left parietal hemorrhage with extension into left lateral ventricle. There was mild mass effect as well as mild vasogenic edema. Blood pressure was markedly elevated at 247/132. Cardene drip was started for blood pressure management. NIH stroke score was 22  Prior Functioning  Home  Living Family/patient expects to be discharged to:: Inpatient rehab Living Arrangements: Spouse/significant other;Children Available Help at Discharge: Family;Available 24 hours/day (however spouse unable to life) Type of Home: House Prior Function Level of Independence: Independent Communication Communication: Expressive difficulties Dominant Hand: Right    Cognition  Cognition Arousal/Alertness: Awake/alert Behavior During Therapy: Flat affect Overall Cognitive Status: Impaired/Different from baseline Area of Impairment: Awareness;Safety/judgement;Problem solving Safety/Judgement: Decreased awareness of safety Awareness: Intellectual Problem Solving: Slow processing;Difficulty sequencing;Requires verbal cues;Requires tactile cues    Extremity/Trunk Assessment Upper Extremity Assessment Upper Extremity Assessment: RUE deficits/detail RUE Deficits / Details: pt flaccid, no active mvmt RUE Sensation: decreased light touch Lower Extremity Assessment Lower Extremity Assessment: RLE deficits/detail RLE Deficits / Details: R side flaccid RLE Sensation: decreased light touch Cervical / Trunk Assessment Cervical / Trunk Assessment: Normal   Balance Balance Balance Assessed: Yes Static Sitting Balance Static Sitting - Balance Support: Left upper extremity supported;Feet supported Static Sitting - Level of Assistance: 1: +1 Total assist Static Sitting - Comment/# of Minutes: pt with strong push to the R or R lateral lean, worked on dynamic reaching with L UE and sitting balance improved  End of Session PT - End of Session Equipment Utilized During Treatment: Gait belt Activity Tolerance: Patient limited by fatigue Patient left: in bed;with call bell/phone within reach;with family/visitor present Nurse Communication: Mobility status  GP     Marcene Brawn 05/02/2013, 3:00 PM  Lewis Shock, PT, DPT Pager #: [phone] Office #: [phone]

## 2013-05-02 NOTE — Progress Notes (Signed)
Rehab Admissions Coordinator Note:  Patient was screened by [name] for appropriateness for an Inpatient Acute Rehab Consult.  At this time, we are recommending Inpatient Rehab consult.  [name] 05/02/2013, 3:43 PM  I can be reached at [phone].

## 2013-05-02 NOTE — Progress Notes (Signed)
RT placed patient on bipap per MD order. Patient is tolerating bipap well at this time. RT will continue to monitor.

## 2013-05-02 NOTE — Progress Notes (Signed)
Stroke Team Progress Note  HISTORY 62 y.o. male history of hypertension who was found on the floor of his home early this morning 05/01/2013 with speech difficulty and inability to move his right side. He was last seen normal at 9:30 PM on 04/30/2013. CT scan of his head showed an 8 cm left parietal hemorrhage with extension into left lateral ventricle. There was mild mass effect as well as mild vasogenic edema. Blood pressure was markedly elevated at 247/132. Cardene drip was started for blood pressure management. NIH stroke score was 22. Patient was not a TPA candidate secondary to hemorrhage. He was admitted to the neuro ICU for further evaluation and treatment.  SUBJECTIVE His wife and daughter are at the bedside. They report he is having difficulty talking.   OBJECTIVE Most recent Vital Signs: Filed Vitals:   05/02/13 0815 05/02/13 0830 05/02/13 0845 05/02/13 0900  BP: 148/76 148/72 150/75 151/74  Pulse: 79 86 85 82  Temp:      TempSrc:      Resp: 22 21 21 22   Height:      Weight:      SpO2: 95% 98% 96% 96%   CBG (last 3)   Recent Labs  05/01/13 0604  GLUCAP 120*    IV Fluid Intake:   . sodium chloride 75 mL/hr (05/02/13 0331)  . niCARDipine 6 mg/hr (05/02/13 0611)    MEDICATIONS  . pantoprazole (PROTONIX) IV  40 mg Intravenous QHS   PRN:  acetaminophen, acetaminophen  Diet:  NPO  Activity:  Bedrest DVT Prophylaxis:  SCDs  CLINICALLY SIGNIFICANT STUDIES Basic Metabolic Panel:   Recent Labs Lab 05/01/13 0558 05/01/13 0605  NA 141 144  K 3.2* 3.5  CL 103 103  CO2 27  --   GLUCOSE 125* 126*  BUN 11 11  CREATININE 1.21 1.30  CALCIUM 8.7  --    Liver Function Tests:   Recent Labs Lab 05/01/13 0558  AST 18  ALT 21  ALKPHOS 75  BILITOT 0.6  PROT 6.6  ALBUMIN 3.3*   CBC:   Recent Labs Lab 05/01/13 0558 05/01/13 0605  WBC 9.0  --   NEUTROABS 6.6  --   HGB 16.6 17.0  HCT 46.9 50.0  MCV 87.7  --   PLT 162  --    Coagulation:   Recent  Labs Lab 05/01/13 0558  LABPROT 13.4  INR 1.04   Cardiac Enzymes:   Recent Labs Lab 05/01/13 0558  TROPONINI <0.30   Urinalysis:   Recent Labs Lab 05/01/13 0559  COLORURINE YELLOW  LABSPEC 1.015  PHURINE 7.5  GLUCOSEU NEGATIVE  HGBUR SMALL*  BILIRUBINUR NEGATIVE  KETONESUR NEGATIVE  PROTEINUR >300*  UROBILINOGEN 0.2  NITRITE NEGATIVE  LEUKOCYTESUR NEGATIVE   Lipid Panel     Component Value Date/Time   CHOL 192 05/01/2013 1445   TRIG 99 05/01/2013 1445   HDL 47 05/01/2013 1445   CHOLHDL 4.1 05/01/2013 1445   VLDL 20 05/01/2013 1445   LDLCALC 125* 05/01/2013 1445   HgbA1C  Lab Results  Component Value Date   HGBA1C 5.5 05/01/2013    Urine Drug Screen:     Component Value Date/Time   LABOPIA NONE DETECTED 05/01/2013 0559   COCAINSCRNUR NONE DETECTED 05/01/2013 0559   LABBENZ NONE DETECTED 05/01/2013 0559   AMPHETMU NONE DETECTED 05/01/2013 0559   THCU NONE DETECTED 05/01/2013 0559   LABBARB NONE DETECTED 05/01/2013 0559    Alcohol Level:   Recent Labs Lab 05/01/13 0558  ETH <11  CT of the brain 05/02/2013    Left hemispheric hemorrhage and associated mass effect as detailed above. When compared to prior examination, the amount of blood within the dependent aspect of the right lateral ventricle has increased.  Mild prominence of the right lateral ventricle unchanged from prior exam. Attention to this on follow up.    05/01/2013   1. Large 4.6 x 3.9 cm intraparenchymal hematoma at the left parietal lobe, adjacent to the left basal ganglia and thalamus. Extension of blood into the left lateral ventricle, with mild associated vasogenic edema. There is minimal if any midline shift at this time. Slight effacement of the left lateral ventricle, without evidence for obstruction. 2. Diffuse small vessel ischemic microangiopathy; chronic lacunar infarct within the inferior right basal ganglia. 3. Mild mucosal thickening within the maxillary sinuses.   MRI of  the brain    MRA of the brain    2D Echocardiogram    Carotid Doppler  No evidence of hemodynamically significant internal carotid artery stenosis. Vertebral artery flow is antegrade.   CXR    EKG  normal EKG, normal sinus rhythm, unchanged from previous tracings.   Therapy Recommendations   GENERAL EXAM: RESTLESS. Well developed. Neck is supple  CARDIOVASCULAR: Regular rate and rhythm, no murmurs, no carotid bruits  NEUROLOGIC: MENTAL STATUS: awake, alert; SIGNIFICANT EXPRESSIVE > RECEPTIVE APHASIA. ABLE TO FOLLOW SIMPLE COMMANDS. SAYS HIS NAME "Arcangel SUTTON". CANNOT NAME THUMB. CANNOT REPEAT "THUMB".  CRANIAL NERVE: pupils equal and reactive to light, RIGHT HOMONYMOUS HEMIANOPSIA. LEFT GAZE PREFERENCE, BUT ABLE TO TRACK PAST MIDLINE TO THE RIGHT. DECR RIGHT LOWER FACIAL STRENGTH. Hearing intact, palate elevates symmetrically, uvula midline, shoulder shrug symmetric, tongue midline. MOTOR: RUE FLACCID 0/5; RLE WITHDRAWAL TO STIM (WITH UPGOING TOE). LUE 4/5. LLE 4/5.  SENSORY: DECR ON RIGHT SIDE COORDINATION: UNABLE IN RIGHT SIDE DUE TO WEAKNESS. LIMITED COMPREHENSION FOR TESTING LEFT SIDE. REFLEXES: deep tendon reflexes TRACE and symmetric GAIT/STATION: BEDREST   ASSESSMENT Mr. MCIHAEL HINDERMAN is a 62 y.o. male presenting with new onset expressive aphasia and right-sided weakness.  Imaging confirms a  large left parietal parenchymal hemorrhage with intraventricular extension and cerebral edema. Hemorrhage felt to be secondary to malignant hypertension with BP 208/122 on admission.  On no antithrombotics prior to admission. Patient with resultant global aphasia and right hemiplegia, slight improvement from yesterday. Work up underway.   Malignant hypertension, not on antihypertensives at home, on cardene drip Hyperlipidemia, LDL 125, on no statin PTA, now on no statin, goal LDL < 100 (< 70 for diabetics) Renal insufficiency, Cr 1.3, borderline UOP  Hospital day #  1  TREATMENT/PLAN  Increase SBP goal to less than 180, will start adding oral agents once he is able to swallow and wean nicardipine at that time  OOB. Therapy evals.  Consider statin  Speech to assess diet  F/u Echo  MRI, MRA  CMET, CBC in am  Monitor UOP  Annie Main, MSN, RN, ANVP-BC, ANP-BC, GNP-BC [facility] Pager: [phone] 05/02/2013 9:12 AM  I have personally obtained a history, examined the patient, evaluated imaging results, and formulated the assessment and plan of care. I agree with the above. On nicardipine gtt. Will start PO anti-HTN agents when able to swallow. Continue ICU monitoring.  Suanne Marker, MD 05/02/2013, 9:59 AM Certified in Neurology, Neurophysiology and Neuroimaging [facility] - Stroke Team  Please refer to [email or web address] for on-call Stroke MD

## 2013-05-02 NOTE — Consult Note (Signed)
Name: [name] MRN: [id number] DOB: 05-25-1950    ADMISSION DATE:  05/01/2013 CONSULTATION DATE:  05/02/2013  REFERRING MD :  Noel Christmas  CHIEF COMPLAINT:  Respiratory distress  BRIEF PATIENT DESCRIPTION:  62 yo male smoker admitted with ICH.  Developed paradoxical breathing pattern, and PCCM consulted to assess respiratory status.  SIGNIFICANT EVENTS:  STUDIES:  12/28 CT head >> 4.6 cm ICH Lt parietal lobe, mild vasogenic edema, minimal mid-line shift, diffuse small vessel ischemic microangiopathy; chronic lacunar infarct within the inferior right basal ganglia.  12/29 Echo >> EF 60 to 65%, mild MR, mod LA dilation  LINES / TUBES: PIV  CULTURES: None  ANTIBIOTICS: None  HISTORY OF PRESENT ILLNESS:   62 yo male smoker with hx of HTN was found on his floor at home with difficulty speaking and unable to move Rt side.  CT head showed 8 cm Lt parietal hemorrhage with extension to Lt lateral ventricle.  His blood pressure was 247/132 on admission.  He was started on cardene gtt, and admitted to ICU by neurology.  He was noted to have paradoxical breathing pattern with progressive increase in respiratory rate.  PCCM was consulted to assess respiratory status.  Pt unable to provide hx, and hx obtain from review of medical record and d/w with medical staff.  PAST MEDICAL HISTORY :  Past Medical History  Diagnosis Date  . Hypertension    History reviewed. No pertinent past surgical history. Prior to Admission medications   Not on File   No Known Allergies  FAMILY HISTORY:  History reviewed. No pertinent family history. SOCIAL HISTORY:  reports that he has been smoking.  He does not have any smokeless tobacco history on file. His alcohol and drug histories are not on file.  REVIEW OF SYSTEMS:   Unable to obtain  SUBJECTIVE:   VITAL SIGNS: Temp:  [97.3 F (36.3 C)-100.4 F (38 C)] 100 F (37.8 C) (12/29 1956) Pulse Rate:  [73-120] 96 (12/29  2100) Resp:  [16-32] 32 (12/29 2045) BP: (143-194)/(68-126) 163/80 mmHg (12/29 2100) SpO2:  [89 %-99 %] 93 % (12/29 2100) Weight:  [197 lb 5 oz (89.5 kg)] 197 lb 5 oz (89.5 kg) (12/29 0500) HEMODYNAMICS:   VENTILATOR SETTINGS:   INTAKE / OUTPUT: Intake/Output     12/29 0701 - 12/30 0700   I.V. (mL/kg) 1357.2 (15.2)   Total Intake(mL/kg) 1357.2 (15.2)   Urine (mL/kg/hr) 2220 (1.7)   Total Output 2220   Net -862.8         PHYSICAL EXAMINATION: General: tachypneic Neuro:  Aphasic, open's eyes with stimulation, not moving Rt upper extremity HEENT:  Pupils reactive, no sinus tenderness Cardiovascular: regular, no murmur Lungs:  Decreased breath sounds, no wheeze Abdomen: soft, paradoxical breathing pattern Musculoskeletal:  No edema Skin:  No rashes  LABS:  CBC  Recent Labs Lab 05/01/13 0558 05/01/13 0605  WBC 9.0  --   HGB 16.6 17.0  HCT 46.9 50.0  PLT 162  --    Coag's  Recent Labs Lab 05/01/13 0558  APTT 30  INR 1.04   BMET  Recent Labs Lab 05/01/13 0558 05/01/13 0605  NA 141 144  K 3.2* 3.5  CL 103 103  CO2 27  --   BUN 11 11  CREATININE 1.21 1.30  GLUCOSE 125* 126*   Electrolytes  Recent Labs Lab 05/01/13 0558  CALCIUM 8.7   Sepsis Markers No results found for this basename: LATICACIDVEN, PROCALCITON, O2SATVEN,  in the last 168  hours ABG No results found for this basename: PHART, PCO2ART, PO2ART,  in the last 168 hours Liver Enzymes  Recent Labs Lab 05/01/13 0558  AST 18  ALT 21  ALKPHOS 75  BILITOT 0.6  ALBUMIN 3.3*   Cardiac Enzymes  Recent Labs Lab 05/01/13 0558  TROPONINI <0.30   Glucose  Recent Labs Lab 05/01/13 0604  GLUCAP 120*    Imaging Ct Head Wo Contrast  05/02/2013   CLINICAL DATA:  Follow-up intracranial hemorrhage. No acute changes. High blood pressure.  EXAM: CT HEAD WITHOUT CONTRAST  TECHNIQUE: Contiguous axial images were obtained from the base of the skull through the vertex without intravenous  contrast.  COMPARISON:  05/01/2013.  FINDINGS: Left hemispheric hemorrhage centered in the left basal ganglia/thalamus spans over 4.5 x 4.2 cm versus prior 4.6 x 4.1 cm. Surrounding vasogenic edema. Breakthrough of hemorrhage into the ventricle with intraventricular blood greater within the left lateral ventricle. The degree of blood within the dependent aspect of the right lateral ventricle has increased since prior exam. Mass effect upon the left lateral ventricle with minimal bowing of the septum to the right.  The cause of the intracranial hemorrhage is indeterminate and may represent a hypertensive hemorrhage. Follow-up until clearance is recommended to exclude underlying lesion.  Small vessel disease type changes. No CT evidence of acute large thrombotic infarct. No intracranial mass lesion seen separate from the above described findings. Contrast was not administered. Polypoid opacification maxillary sinuses.  IMPRESSION: Left hemispheric hemorrhage and associated mass effect as detailed above. When compared to prior examination, the amount of blood within the dependent aspect of the right lateral ventricle has increased.  Mild prominence of the right lateral ventricle unchanged from prior exam. Attention to this on follow up.   Electronically Signed   By: Bridgett Larsson M.D.   On: 05/02/2013 07:13   Ct Head Wo Contrast  05/01/2013   CLINICAL DATA:  Right-sided weakness and altered mental status.  EXAM: CT HEAD WITHOUT CONTRAST  TECHNIQUE: Contiguous axial images were obtained from the base of the skull through the vertex without intravenous contrast.  COMPARISON:  MRI of the brain performed 10/02/2006  FINDINGS: There is a large 4.6 x 3.9 cm intraparenchymal hematoma noted at the left parietal lobe, adjacent to the left basal ganglia and thalamus. There is extension of blood into the left lateral ventricle. Mild associated vasogenic edema is seen, though there is minimal if any midline shift at this time.  There is slight effacement of the left lateral ventricle, without evidence for obstruction.  Relatively diffuse periventricular and subcortical white matter change likely reflects small vessel ischemic microangiopathy. A chronic lacunar infarct is noted within the inferior right basal ganglia. The posterior fossa, including the cerebellum, brainstem and fourth ventricle, is within normal limits.  There is no evidence of fracture; visualized osseous structures are unremarkable in appearance. The orbits are within normal limits. Mild mucosal thickening is noted within the maxillary sinuses; the remaining paranasal sinuses and mastoid air cells are well-aerated. No significant soft tissue abnormalities are seen.  IMPRESSION: 1. Large 4.6 x 3.9 cm intraparenchymal hematoma at the left parietal lobe, adjacent to the left basal ganglia and thalamus. Extension of blood into the left lateral ventricle, with mild associated vasogenic edema. There is minimal if any midline shift at this time. Slight effacement of the left lateral ventricle, without evidence for obstruction. 2. Diffuse small vessel ischemic microangiopathy; chronic lacunar infarct within the inferior right basal ganglia. 3. Mild mucosal thickening  within the maxillary sinuses.  Critical Value/emergent results were called by telephone at the time of interpretation on 05/01/2013 at 5:35 AM to Dr. Eber Hong , who verbally acknowledged these results.   Electronically Signed   By: Roanna Raider M.D.   On: 05/01/2013 05:37    ASSESSMENT / PLAN:  PULMONARY A: Acute respiratory distress 12/29. Hx of smoking. P:   -f/u CXR, ABG -oxygen to keep SpO2 > 92% -prn BD's -trial of BiPAP -needs smoking cessation  NEUROLOGIC A:   ICH with aphasia, and Rt sided weakness. P:   -per neurology  CARDIOVASCULAR A:  Malignant HTN. P:  -cardene gtt per neurology to keep SBP < 180  RENAL A:   No issues. P:   -monitor renal fx, urine outpt,  electrolytes  GASTROINTESTINAL A:   Dysphagia. P:   -NPO -protonix for SUP  HEMATOLOGIC A:   No issues. P:  -f/u CBC -SCD's for DVT prevention  INFECTIOUS A:   At risk for aspiration. P:   -f/u CXR -monitor clinically off Abx for now  ENDOCRINE A:   No issues. P:   -monitor blood sugar on BMET  CC time 35 minutes.  Coralyn Helling, MD [facility] Pulmonary/Critical Care 05/02/2013, 10:03 PM Pager:  [phone] After 3pm call: [phone]

## 2013-05-02 NOTE — Progress Notes (Signed)
Speech Language Pathology Treatment: Dysphagia  Patient Details Name: [name] MRN: [id number] DOB: August 30, 1950 Today's Date: 05/02/2013 Time: 1000-1015 SLP Time Calculation (min): 15 min  Assessment / Plan / Recommendation Clinical Impression  F/u from initial BSE to assess swallow to determine PO readiness.  Improvement in LOA in comparison to prior date.  Cognitively unable to complete volitional throat clear and cough with observed  inconsistent initiation of swallow with PO trials.  Delayed s/s of suspected penetration and or aspiration s/p swallow of ice chips and thin water by spoon due to delay and suspected incomplete airway closure.  Tolerated small amount of puree x2 w/o coughing but noted with increased WOB s/p swallow.  Due to s/s present and  inability to complete strategies to protect airway fully recommend continued NPO status.  For comfort recommend  ice chips sparingly only after oral care as tolerated.  Recommend to proceed with objective evaluation of MBS to assess risk for aspiration and determine safest, PO diet.  ST to f/u on 05/03/13 to assess readiness to complete evaluation.     HPI HPI: 62 y/o male history of HTN found on floor of his home with speech difficulty and unable to move his right side.  Last seen normal at 9:30am on 04/30/13.  CT scan of his head showed an 8cm left parietal hemorrhage with extension into left lateral ventricle.  Mild  mass effect as well as vasogenic edema.  Blood pressure was makedly elevated at 247/132/        SLP Plan  Continue with current plan of care;New goals to be determined pending instrumental study    Recommendations Diet recommendations: NPO Medication Administration: Via alternative means              General recommendations: Rehab consult Oral Care Recommendations: Oral care Q4 per protocol Follow up Recommendations: Inpatient Rehab Plan: Continue with current plan of care;New goals to be determined pending  instrumental study    GO    [name] MS, CCC-SLP [phone] [facility] 05/02/2013, 10:37 AM

## 2013-05-02 NOTE — Evaluation (Signed)
Speech Language Pathology Evaluation Patient Details Name: [name] MRN: [id number] DOB: 02-06-1951 Today's Date: 05/02/2013 Time: 1015-1030 SLP Time Calculation (min): 15 min  Problem List:  Patient Active Problem List   Diagnosis Date Noted  . ICH (intracerebral hemorrhage) 05/01/2013   Past Medical History:  Past Medical History  Diagnosis Date  . Hypertension    Past Surgical History: History reviewed. No pertinent past surgical history. HPI:  62 y.o. male history of hypertension who was found on the floor of his home early this morning 05/01/2013 with speech difficulty and inability to move his right side. He was last seen normal at 9:30 PM on 04/30/2013. CT scan of his head showed an 8 cm left parietal hemorrhage with extension into left lateral ventricle. There was mild mass effect as well as mild vasogenic edema. Blood pressure was markedly elevated at 247/132. Cardene drip was started for blood pressure management. NIH stroke score was 22.   Assessment / Plan / Recommendation Clinical Impression  Cognitive Linguistic Evaluation completed per Stroke Protocol.  Difficult to assess cognition due to severity of receptive and expressive language deficits.  Attempts noted to verbalize but speech unintelligible and mainly filled with paraphasia's.  Response to yes/no questions less than 50% accurate.  Demonstrates ability to complete one to two step ADL's with set up required with max tactile cues to attend to right.  Recommend continued ST in acute care setting to improve functional communication skills and to further assess cognition.  Recommend CIR consult.     SLP Assessment       Follow Up Recommendations  Inpatient Rehab    Frequency and Duration min 2x/week         SLP Goals  Please refer to Care Plans for listed goals   SLP Evaluation Prior Functioning  Cognitive/Linguistic Baseline: Within functional limits Type of Home: House  Lives With: Spouse Available  Help at Discharge: Family;Available PRN/intermittently Vocation: Full time employment   Cognition  Overall Cognitive Status: Impaired/Different from baseline Arousal/Alertness: Awake/alert Orientation Level: Oriented to person Attention: Sustained Sustained Attention: Impaired Sustained Attention Impairment: Functional basic;Verbal basic Memory: Impaired Awareness: Impaired Problem Solving: Impaired Problem Solving Impairment: Functional basic;Verbal basic Behaviors: Perseveration Safety/Judgment: Impaired    Comprehension  Auditory Comprehension Overall Auditory Comprehension: Impaired Yes/No Questions: Impaired Basic Biographical Questions: 0-25% accurate Basic Immediate Environment Questions: 0-24% accurate Commands: Impaired One Step Basic Commands: 0-24% accurate Two Step Basic Commands: 0-24% accurate Interfering Components: Attention;Motor planning;Processing speed EffectiveTechniques: Extra processing time;Repetition;Visual/Gestural cues Visual Recognition/Discrimination Discrimination: Not tested Reading Comprehension Reading Status: Unable to assess (comment)    Expression Expression Primary Mode of Expression: Verbal Verbal Expression Overall Verbal Expression: Impaired Initiation: Impaired Automatic Speech: Name;Social Response;Counting;Day of week Level of Generative/Spontaneous Verbalization: Word Repetition: Impaired Level of Impairment: Word level Naming: Impairment Responsive: 0-25% accurate Confrontation: Impaired Convergent: 0-24% accurate Divergent: 0-24% accurate Verbal Errors: Phonemic paraphasias;Neologisms;Perseveration;Aware of errors Interfering Components: Attention Effective Techniques: Open ended questions;Semantic cues;Phonemic cues Written Expression Dominant Hand: Right Written Expression: Not tested   Oral / Motor Oral Motor/Sensory Function Overall Oral Motor/Sensory Function: Impaired Labial ROM: Reduced right Labial Symmetry:  Abnormal symmetry right Labial Strength: Reduced Labial Sensation: Reduced Lingual ROM: Reduced right Lingual Symmetry: Abnormal symmetry right Lingual Strength: Reduced Lingual Sensation: Reduced Facial ROM: Reduced right Facial Symmetry: Right droop Facial Strength: Reduced Facial Sensation: Reduced Velum: Within Functional Limits Mandible: Within Functional Limits Motor Speech Overall Motor Speech: Impaired Phonation: Hoarse Resonance: Within functional limits Articulation: Impaired Level of Impairment: Word  Motor Planning: Impaired Level of Impairment: Word Motor Speech Errors: Aware Effective Techniques: Slow rate;Pause   GO    [name] MS, CCC-SLP [phone] [facility] 05/02/2013, 11:02 AM

## 2013-05-03 ENCOUNTER — Inpatient Hospital Stay (HOSPITAL_COMMUNITY): Payer: Medicare Other

## 2013-05-03 DIAGNOSIS — I619 Nontraumatic intracerebral hemorrhage, unspecified: Secondary | ICD-10-CM | POA: Diagnosis not present

## 2013-05-03 DIAGNOSIS — Z4682 Encounter for fitting and adjustment of non-vascular catheter: Secondary | ICD-10-CM | POA: Diagnosis not present

## 2013-05-03 DIAGNOSIS — R935 Abnormal findings on diagnostic imaging of other abdominal regions, including retroperitoneum: Secondary | ICD-10-CM | POA: Diagnosis not present

## 2013-05-03 LAB — GLUCOSE, CAPILLARY
Glucose-Capillary: 104 mg/dL — ABNORMAL HIGH (ref 70–99)
Glucose-Capillary: 103 mg/dL — ABNORMAL HIGH (ref 70–99)

## 2013-05-03 LAB — BASIC METABOLIC PANEL
CO2: 23 mEq/L (ref 19–32)
Chloride: 108 mEq/L (ref 96–112)
Creatinine, Ser: 1.18 mg/dL (ref 0.50–1.35)
GFR calc Af Amer: 75 mL/min — ABNORMAL LOW (ref >90)
Glucose, Bld: 129 mg/dL — ABNORMAL HIGH (ref 70–99)
Sodium: 144 mEq/L (ref 137–147)

## 2013-05-03 LAB — CBC
HCT: 42.1 % (ref 39.0–52.0)
MCV: 89.8 fL (ref 78.0–100.0)
RBC: 4.69 MIL/uL (ref 4.22–5.81)
WBC: 12.2 10*3/uL — ABNORMAL HIGH (ref 4.0–10.5)

## 2013-05-03 MED ORDER — LABETALOL HCL 5 MG/ML IV SOLN
10.0000 mg | INTRAVENOUS | Status: DC | PRN
  Administered 2013-05-04: 20 mg via INTRAVENOUS
  Administered 2013-05-05: 10 mg via INTRAVENOUS
  Administered 2013-05-05 – 2013-05-06 (×9): 20 mg via INTRAVENOUS
  Filled 2013-05-03 (×11): qty 4

## 2013-05-03 MED ORDER — BIOTENE DRY MOUTH MT LIQD
15.0000 mL | Freq: Two times a day (BID) | OROMUCOSAL | Status: DC
  Administered 2013-05-04 – 2013-05-06 (×5): 15 mL via OROMUCOSAL

## 2013-05-03 MED ORDER — AMLODIPINE BESYLATE 5 MG PO TABS
5.0000 mg | ORAL_TABLET | Freq: Every day | ORAL | Status: DC
  Administered 2013-05-03: 5 mg via ORAL
  Filled 2013-05-03 (×2): qty 1

## 2013-05-03 MED ORDER — VITAL AF 1.2 CAL PO LIQD
1000.0000 mL | ORAL | Status: DC
  Filled 2013-05-03 (×2): qty 1000

## 2013-05-03 MED ORDER — CHLORHEXIDINE GLUCONATE 0.12 % MT SOLN
15.0000 mL | Freq: Two times a day (BID) | OROMUCOSAL | Status: DC
  Administered 2013-05-03 – 2013-05-06 (×6): 15 mL via OROMUCOSAL
  Filled 2013-05-03 (×8): qty 15

## 2013-05-03 MED ORDER — NICARDIPINE HCL IN NACL 40-0.83 MG/200ML-% IV SOLN
3.0000 mg/h | INTRAVENOUS | Status: DC
  Filled 2013-05-03: qty 200

## 2013-05-03 MED ORDER — VITAL AF 1.2 CAL PO LIQD
1000.0000 mL | ORAL | Status: DC
  Administered 2013-05-03: 1000 mL
  Filled 2013-05-03 (×6): qty 1000

## 2013-05-03 MED ORDER — POTASSIUM CHLORIDE 10 MEQ/100ML IV SOLN
10.0000 meq | INTRAVENOUS | Status: AC
  Administered 2013-05-03 (×2): 10 meq via INTRAVENOUS
  Filled 2013-05-03 (×3): qty 100

## 2013-05-03 NOTE — Consult Note (Signed)
Physical Medicine and Rehabilitation Consult  Reason for Consult: Right sided weakness, slurred speech, aphasia,  Referring Physician:  Dr. Marjory Lies   HPI: [name] is a 62 y.o. male with history of HTN--no medication; who was found down on floor with inability to speak or move right side. 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 BP elevated at 247/132 and patient started on cardene drip for control--NIH stroke score-22. 2 D echo with EF 60-65%, mild MR, mod LA dilation. Carotid dopplers without significant ICA stenosis. Patient with resultant dysarthria, expressive>receptive aphsia, left gaze preference, right hemiparesis with sensory deficits. Therapies initiated yesterday--NPO recommended due to concerns of aspiration and lethargy, unintelligible speech with severe expressive/receptive and pre-gait activity initiated. Patient with acute respiratory distress 12/29 pm requiring BIPAP. MD, PT, ST recommending CIR.    Review of Systems  Unable to perform ROS: language    Past Medical History  Diagnosis Date  . Hypertension     History reviewed. No pertinent past surgical history.   History reviewed. No pertinent family history.   Social History:  reports that he has been smoking.  He does not have any smokeless tobacco history on file. His alcohol and drug histories are not on file.   Allergies: No Known Allergies   No prescriptions prior to admission    Home: Home Living Family/patient expects to be discharged to:: Inpatient rehab Living Arrangements: Spouse/significant other;Children Available Help at Discharge: Family;Available 24 hours/day (however spouse unable to life) Type of Home: House  Lives With: Spouse  Functional History: Prior  Function Vocation: Full time employment Functional Status:  Mobility: Bed Mobility Bed Mobility: Supine to Sit;Sit to Supine Supine to Sit: 1: +2 Total assist;HOB flat Supine to Sit: Patient Percentage: 20% Sit to Supine: 1: +2 Total assist;HOB flat Sit to Supine: Patient Percentage: 40% Transfers Transfers: Sit to Stand;Stand to Sit Sit to Stand: 1: +2 Total assist;With upper extremity assist;From bed Sit to Stand: Patient Percentage: 30% Stand to Sit: 1: +2 Total assist;With upper extremity assist;To bed Stand to Sit: Patient Percentage: 30% Ambulation/Gait Ambulation/Gait Assistance: Not tested (comment)    ADL:    Cognition: Cognition Overall Cognitive Status: Impaired/Different from baseline Arousal/Alertness: Awake/alert Orientation Level: Disoriented to place;Disoriented to time;Disoriented to situation;Disoriented to person Attention: Sustained Sustained Attention: Impaired Sustained Attention Impairment: Functional basic;Verbal basic Memory: Impaired Awareness: Impaired Problem Solving: Impaired Problem Solving Impairment: Functional basic;Verbal basic Behaviors: Perseveration Safety/Judgment: Impaired Cognition Arousal/Alertness: Awake/alert Behavior During Therapy: Flat affect Overall Cognitive Status: Impaired/Different from baseline Area of Impairment: Awareness;Safety/judgement;Problem solving Safety/Judgement: Decreased awareness of safety Awareness: Intellectual Problem Solving: Slow processing;Difficulty sequencing;Requires verbal cues;Requires tactile cues  Blood pressure 127/102, pulse 102, temperature 100.8 F (38.2 C), temperature source Rectal, resp. rate 21, height 5\' 10"  (1.778 m), weight 89.5 kg (197 lb 5 oz), SpO2 94.00%. Physical Exam  Nursing note and vitals reviewed. Constitutional: He appears well-developed and well-nourished.  HENT:  Head: Normocephalic and atraumatic.  Eyes: Conjunctivae are normal. Pupils are equal, round, and  reactive to light.  Neck: Normal range of motion. Neck supple.  Cardiovascular: Regular rhythm.  Tachycardia present.   Respiratory: Effort normal and breath sounds normal. No respiratory distress. He has no wheezes.  Mucopurulent sputum noted.   GI: Soft. Bowel sounds are normal. He exhibits no distension.  There is no tenderness.  Neurological: He is alert.  Right inattention--does not turn eyes past midline. Flat affect with delayed processing. Restless and distracted. Minimal verbal output.  Dense right hemiparesis with sensory deficits. Moves LUE/LLE with visual/verbal cues. Unable to follow simple commands.  RUE and RLE are 0/5. Does seem to have some gross pain sense in the right leg. Did utter "ok" when I told him I would see him later.   Skin: Skin is warm and dry.    Results for orders placed during the hospital encounter of 05/01/13 (from the past 24 hour(s))  POCT I-STAT 3, BLOOD GAS (G3+)     Status: Abnormal   Collection Time    05/02/13 10:20 PM      Result Value Range   pH, Arterial 7.423  7.350 - 7.450   pCO2 arterial 37.5  35.0 - 45.0 mmHg   pO2, Arterial 73.0 (*) 80.0 - 100.0 mmHg   Bicarbonate 24.4 (*) 20.0 - 24.0 mEq/L   TCO2 26  0 - 100 mmol/L   O2 Saturation 95.0     Patient temperature 99.00 F     Collection site RADIAL, ALLEN'S TEST ACCEPTABLE     Drawn by RT     Sample type ARTERIAL    BASIC METABOLIC PANEL     Status: Abnormal   Collection Time    05/03/13  3:58 AM      Result Value Range   Sodium 144  137 - 147 mEq/L   Potassium 3.4 (*) 3.7 - 5.3 mEq/L   Chloride 108  96 - 112 mEq/L   CO2 23  19 - 32 mEq/L   Glucose, Bld 129 (*) 70 - 99 mg/dL   BUN 17  6 - 23 mg/dL   Creatinine, Ser 1.61  0.50 - 1.35 mg/dL   Calcium 8.3 (*) 8.4 - 10.5 mg/dL   GFR calc non Af Amer 64 (*) >90 mL/min   GFR calc Af Amer 75 (*) >90 mL/min  CBC     Status: Abnormal   Collection Time    05/03/13  3:58 AM      Result Value Range   WBC 12.2 (*) 4.0 - 10.5 K/uL   RBC 4.69   4.22 - 5.81 MIL/uL   Hemoglobin 14.1  13.0 - 17.0 g/dL   HCT 09.6  04.5 - 40.9 %   MCV 89.8  78.0 - 100.0 fL   MCH 30.1  26.0 - 34.0 pg   MCHC 33.5  30.0 - 36.0 g/dL   RDW 81.1  91.4 - 78.2 %   Platelets 190  150 - 400 K/uL   Ct Head Wo Contrast  05/02/2013   CLINICAL DATA:  Follow-up intracranial hemorrhage. No acute changes. High blood pressure.  EXAM: CT HEAD WITHOUT CONTRAST  TECHNIQUE: Contiguous axial images were obtained from the base of the skull through the vertex without intravenous contrast.  COMPARISON:  05/01/2013.  FINDINGS: Left hemispheric hemorrhage centered in the left basal ganglia/thalamus spans over 4.5 x 4.2 cm versus prior 4.6 x 4.1 cm. Surrounding vasogenic edema. Breakthrough of hemorrhage into the ventricle with intraventricular blood greater within the left lateral ventricle. The degree of blood within the dependent aspect of the right lateral ventricle has increased since prior exam. Mass effect upon the left lateral ventricle with minimal bowing of the septum to the right.  The cause of the intracranial hemorrhage is indeterminate and may represent a hypertensive hemorrhage. Follow-up until clearance is recommended to exclude underlying  lesion.  Small vessel disease type changes. No CT evidence of acute large thrombotic infarct. No intracranial mass lesion seen separate from the above described findings. Contrast was not administered. Polypoid opacification maxillary sinuses.  IMPRESSION: Left hemispheric hemorrhage and associated mass effect as detailed above. When compared to prior examination, the amount of blood within the dependent aspect of the right lateral ventricle has increased.  Mild prominence of the right lateral ventricle unchanged from prior exam. Attention to this on follow up.   Electronically Signed   By: Bridgett Larsson M.D.   On: 05/02/2013 07:13   Dg Chest Port 1 View  05/02/2013   CLINICAL DATA:  Respiratory distress.  EXAM: PORTABLE CHEST - 1 VIEW   COMPARISON:  01/09/2007  FINDINGS: Shallow inspiration. Cardiac enlargement with mild pulmonary vascular congestion, developing since prior study. No focal airspace disease or edema. No blunting of costophrenic angles. No pneumothorax. Postoperative changes in the cervical spine.  IMPRESSION: Shallow inspiration. Cardiac enlargement with suggestion of early vascular congestion. No edema or consolidation.   Electronically Signed   By: Burman Nieves M.D.   On: 05/02/2013 22:30    Assessment/Plan: Diagnosis: left parietal/BG ICH 1. Does the need for close, 24 hr/day medical supervision in concert with the patient's rehab needs make it unreasonable for this patient to be served in a less intensive setting? Yes 2. Co-Morbidities requiring supervision/potential complications: htn, dysphagia 3. Due to bladder management, bowel management, safety, skin/wound care, disease management, medication administration, pain management and patient education, does the patient require 24 hr/day rehab nursing? Yes 4. Does the patient require coordinated care of a physician, rehab nurse, PT (1-2 hrs/day, 5 days/week), OT (1-2 hrs/day, 5 days/week) and SLP (1-2 hrs/day, 5 days/week) to address physical and functional deficits in the context of the above medical diagnosis(es)? Yes Addressing deficits in the following areas: balance, endurance, locomotion, strength, transferring, bowel/bladder control, bathing, dressing, feeding, grooming, toileting, cognition, speech, language, swallowing and psychosocial support 5. Can the patient actively participate in an intensive therapy program of at least 3 hrs of therapy per day at least 5 days per week? Yes 6. The potential for patient to make measurable gains while on inpatient rehab is good 7. Anticipated functional outcomes upon discharge from inpatient rehab are min to mod assist with PT, min to mod assist with OT,  Min to mod assist with SLP. 8. Estimated rehab length of stay  to reach the above functional goals is: 22-30 days 9. Does the patient have adequate social supports to accommodate these discharge functional goals? Yes 10. Anticipated D/C setting: Home 11. Anticipated post D/C treatments: HH therapy 12. Overall Rehab/Functional Prognosis: excellent  RECOMMENDATIONS: This patient's condition is appropriate for continued rehabilitative care in the following setting: CIR Patient has agreed to participate in recommended program. Yes Note that insurance prior authorization may be required for reimbursement for recommended care.  Comment: Rehab RN to follow up.   Ranelle Oyster, MD, [place] [facility]     05/03/2013

## 2013-05-03 NOTE — Progress Notes (Signed)
SLP Cancellation Note  Patient Details Name: [name] MRN: [id number] DOB: [birth year]   Cancelled treatment:  Attempt x2 for ST treatment.  Not completed as patient out of room to complete repeat CT first attempt.  Second attempt patient with MD.  ST to f/u on 05/04/13 to assess readiness for completion of objective evaluation.   [name] MS, CCC-SLP [phone] [facility] 05/03/2013, 11:29 AM

## 2013-05-03 NOTE — Progress Notes (Signed)
Name: [name] MRN: [id number] DOB: 05-21-1950    ADMISSION DATE:  05/01/2013 CONSULTATION DATE:  05/02/2013  REFERRING MD :  Noel Christmas  CHIEF COMPLAINT:  Respiratory distress  BRIEF PATIENT DESCRIPTION:  62 yo male smoker admitted with ICH 12/28.  Developed paradoxical breathing pattern, and PCCM consulted to assess respiratory status.  SIGNIFICANT EVENTS:  STUDIES:  12/28 CT head >> 4.6 cm ICH Lt parietal lobe, mild vasogenic edema, minimal mid-line shift, diffuse small vessel ischemic microangiopathy; chronic lacunar infarct within the inferior right basal ganglia.  12/29 Echo >> EF 60 to 65%, mild MR, mod LA dilation  LINES / TUBES: PIV  CULTURES: None  ANTIBIOTICS: None    SUBJECTIVE: low gr fever No obvious pain or dyspnea  VITAL SIGNS: Temp:  [97.3 F (36.3 C)-100.8 F (38.2 C)] 100.8 F (38.2 C) (12/30 0800) Pulse Rate:  [76-120] 100 (12/30 0845) Resp:  [18-32] 21 (12/30 0800) BP: (127-194)/(65-126) 150/75 mmHg (12/30 0845) SpO2:  [89 %-99 %] 96 % (12/30 0845) HEMODYNAMICS:   VENTILATOR SETTINGS:   INTAKE / OUTPUT: Intake/Output     12/29 0701 - 12/30 0700 12/30 0701 - 12/31 0700   I.V. (mL/kg) 2557.2 (28.6) 119.7 (1.3)   Total Intake(mL/kg) 2557.2 (28.6) 119.7 (1.3)   Urine (mL/kg/hr) 2765 (1.3) 120 (0.6)   Total Output 2765 120   Net -207.8 -0.3          PHYSICAL EXAMINATION: General: tachypneic Neuro:  Aphasic, rt hemineglect & hemiplegia, left 4/5, not moving Rt upper extremity HEENT:  Pupils reactive, no sinus tenderness Cardiovascular: regular, no murmur Lungs:  Decreased breath sounds, no wheeze Abdomen: soft, non tender Musculoskeletal:  No edema Skin:  No rashes  LABS:  CBC  Recent Labs Lab 05/01/13 0558 05/01/13 0605 05/03/13 0358  WBC 9.0  --  12.2*  HGB 16.6 17.0 14.1  HCT 46.9 50.0 42.1  PLT 162  --  190   Coag's  Recent Labs Lab 05/01/13 0558  APTT 30  INR 1.04   BMET  Recent Labs Lab  05/01/13 0558 05/01/13 0605 05/03/13 0358  NA 141 144 144  K 3.2* 3.5 3.4*  CL 103 103 108  CO2 27  --  23  BUN 11 11 17   CREATININE 1.21 1.30 1.18  GLUCOSE 125* 126* 129*   Electrolytes  Recent Labs Lab 05/01/13 0558 05/03/13 0358  CALCIUM 8.7 8.3*   Sepsis Markers No results found for this basename: LATICACIDVEN, PROCALCITON, O2SATVEN,  in the last 168 hours ABG  Recent Labs Lab 05/02/13 2220  PHART 7.423  PCO2ART 37.5  PO2ART 73.0*   Liver Enzymes  Recent Labs Lab 05/01/13 0558  AST 18  ALT 21  ALKPHOS 75  BILITOT 0.6  ALBUMIN 3.3*   Cardiac Enzymes  Recent Labs Lab 05/01/13 0558  TROPONINI <0.30   Glucose  Recent Labs Lab 05/01/13 0604  GLUCAP 120*    Imaging Ct Head Wo Contrast  05/02/2013   CLINICAL DATA:  Follow-up intracranial hemorrhage. No acute changes. High blood pressure.  EXAM: CT HEAD WITHOUT CONTRAST  TECHNIQUE: Contiguous axial images were obtained from the base of the skull through the vertex without intravenous contrast.  COMPARISON:  05/01/2013.  FINDINGS: Left hemispheric hemorrhage centered in the left basal ganglia/thalamus spans over 4.5 x 4.2 cm versus prior 4.6 x 4.1 cm. Surrounding vasogenic edema. Breakthrough of hemorrhage into the ventricle with intraventricular blood greater within the left lateral ventricle. The degree of blood within the dependent aspect  of the right lateral ventricle has increased since prior exam. Mass effect upon the left lateral ventricle with minimal bowing of the septum to the right.  The cause of the intracranial hemorrhage is indeterminate and may represent a hypertensive hemorrhage. Follow-up until clearance is recommended to exclude underlying lesion.  Small vessel disease type changes. No CT evidence of acute large thrombotic infarct. No intracranial mass lesion seen separate from the above described findings. Contrast was not administered. Polypoid opacification maxillary sinuses.  IMPRESSION:  Left hemispheric hemorrhage and associated mass effect as detailed above. When compared to prior examination, the amount of blood within the dependent aspect of the right lateral ventricle has increased.  Mild prominence of the right lateral ventricle unchanged from prior exam. Attention to this on follow up.   Electronically Signed   By: Bridgett Larsson M.D.   On: 05/02/2013 07:13   Dg Chest Port 1 View  05/02/2013   CLINICAL DATA:  Respiratory distress.  EXAM: PORTABLE CHEST - 1 VIEW  COMPARISON:  01/09/2007  FINDINGS: Shallow inspiration. Cardiac enlargement with mild pulmonary vascular congestion, developing since prior study. No focal airspace disease or edema. No blunting of costophrenic angles. No pneumothorax. Postoperative changes in the cervical spine.  IMPRESSION: Shallow inspiration. Cardiac enlargement with suggestion of early vascular congestion. No edema or consolidation.   Electronically Signed   By: Burman Nieves M.D.   On: 05/02/2013 22:30    ASSESSMENT / PLAN:  PULMONARY A: Acute respiratory distress 12/29. Hx of smoking. ?underlying OSA P:   -oxygen to keep SpO2 > 92% -prn BD's - BiPAP during sleep -smoking cessation  NEUROLOGIC A:   ICH with aphasia, and Rt sided weakness. P:   -per neurology  CARDIOVASCULAR A:  Malignant HTN. P:  -cardene gtt per neurology to keep SBP < 180  RENAL A:   No issues. P:   -monitor renal fx, urine outpt, electrolytes -replete K  GASTROINTESTINAL A:   Dysphagia. P:   -NPO, place panda for TFs -protonix for SUP  HEMATOLOGIC A:   No issues. P:  -f/u CBC -SCD's for DVT prevention  INFECTIOUS A:   At risk for aspiration. P:   -f/u CXR -monitor clinically off Abx for now  ENDOCRINE A:   No issues. P:   -monitor blood sugar on BMET  The patient is critically ill with multiple organ systems failure and requires high complexity decision making for assessment and support, frequent evaluation and titration of  therapies, application of advanced monitoring technologies and extensive interpretation of multiple databases. Critical Care Time devoted to patient care services described in this note is 31 minutes.    Cyril Mourning MD. Tonny Bollman. [facility] Pager [phone] If no response call [phone]    05/03/2013, 9:13 AM

## 2013-05-03 NOTE — Progress Notes (Signed)
Stroke Team Progress Note  HISTORY 62 y.o. male history of hypertension who was found on the floor of his home early this morning 05/01/2013 with speech difficulty and inability to move his right side. He was last seen normal at 9:30 PM on 04/30/2013. CT scan of his head showed an 8 cm left parietal hemorrhage with extension into left lateral ventricle. There was mild mass effect as well as mild vasogenic edema. Blood pressure was markedly elevated at 247/132. Cardene drip was started for blood pressure management. NIH stroke score was 22. Patient was not a TPA candidate secondary to hemorrhage. He was admitted to the neuro ICU for further evaluation and treatment.  SUBJECTIVE Sister and brother at bedside. RN reports he is slightly more restless than yesterday. Sister is concerned about dry lips.  OBJECTIVE Most recent Vital Signs: Filed Vitals:   05/03/13 0800 05/03/13 0815 05/03/13 0830 05/03/13 0845  BP: 127/102 158/66 145/72 150/75  Pulse: 102 86 91 100  Temp: 100.8 F (38.2 C)     TempSrc: Rectal     Resp: 21     Height:      Weight:      SpO2: 94% 96% 95% 96%   CBG (last 3)   Recent Labs  05/01/13 0604  GLUCAP 120*    IV Fluid Intake:   . sodium chloride 75 mL/hr at 05/02/13 1733  . niCARDipine 7 mg/hr (05/03/13 0758)    MEDICATIONS  . pantoprazole (PROTONIX) IV  40 mg Intravenous QHS   PRN:  acetaminophen, acetaminophen  Diet:  NPO  Activity:  Up with assistance DVT Prophylaxis:  SCDs  CLINICALLY SIGNIFICANT STUDIES Basic Metabolic Panel:   Recent Labs Lab 05/01/13 0558 05/01/13 0605 05/03/13 0358  NA 141 144 144  K 3.2* 3.5 3.4*  CL 103 103 108  CO2 27  --  23  GLUCOSE 125* 126* 129*  BUN 11 11 17   CREATININE 1.21 1.30 1.18  CALCIUM 8.7  --  8.3*   Liver Function Tests:   Recent Labs Lab 05/01/13 0558  AST 18  ALT 21  ALKPHOS 75  BILITOT 0.6  PROT 6.6  ALBUMIN 3.3*   CBC:   Recent Labs Lab 05/01/13 0558 05/01/13 0605 05/03/13 0358   WBC 9.0  --  12.2*  NEUTROABS 6.6  --   --   HGB 16.6 17.0 14.1  HCT 46.9 50.0 42.1  MCV 87.7  --  89.8  PLT 162  --  190   Coagulation:   Recent Labs Lab 05/01/13 0558  LABPROT 13.4  INR 1.04   Cardiac Enzymes:   Recent Labs Lab 05/01/13 0558  TROPONINI <0.30   Urinalysis:   Recent Labs Lab 05/01/13 0559  COLORURINE YELLOW  LABSPEC 1.015  PHURINE 7.5  GLUCOSEU NEGATIVE  HGBUR SMALL*  BILIRUBINUR NEGATIVE  KETONESUR NEGATIVE  PROTEINUR >300*  UROBILINOGEN 0.2  NITRITE NEGATIVE  LEUKOCYTESUR NEGATIVE   Lipid Panel     Component Value Date/Time   CHOL 192 05/01/2013 1445   TRIG 99 05/01/2013 1445   HDL 47 05/01/2013 1445   CHOLHDL 4.1 05/01/2013 1445   VLDL 20 05/01/2013 1445   LDLCALC 125* 05/01/2013 1445   HgbA1C  Lab Results  Component Value Date   HGBA1C 5.5 05/01/2013    Urine Drug Screen:     Component Value Date/Time   LABOPIA NONE DETECTED 05/01/2013 0559   COCAINSCRNUR NONE DETECTED 05/01/2013 0559   LABBENZ NONE DETECTED 05/01/2013 0559   AMPHETMU NONE DETECTED 05/01/2013  0559   THCU NONE DETECTED 05/01/2013 0559   LABBARB NONE DETECTED 05/01/2013 0559    Alcohol Level:   Recent Labs Lab 05/01/13 0558  ETH <11    CT of the brain 05/02/2013    Left hemispheric hemorrhage and associated mass effect as detailed above. When compared to prior examination, the amount of blood within the dependent aspect of the right lateral ventricle has increased.  Mild prominence of the right lateral ventricle unchanged from prior exam. Attention to this on follow up.    05/01/2013   1. Large 4.6 x 3.9 cm intraparenchymal hematoma at the left parietal lobe, adjacent to the left basal ganglia and thalamus. Extension of blood into the left lateral ventricle, with mild associated vasogenic edema. There is minimal if any midline shift at this time. Slight effacement of the left lateral ventricle, without evidence for obstruction. 2. Diffuse small vessel  ischemic microangiopathy; chronic lacunar infarct within the inferior right basal ganglia. 3. Mild mucosal thickening within the maxillary sinuses.   MRI of the brain    MRA of the brain    2D Echocardiogram  EF 60-65% with no source of embolus.   Carotid Doppler  No evidence of hemodynamically significant internal carotid artery stenosis. Vertebral artery flow is antegrade.   CXR  05/02/2013 Shallow inspiration. Cardiac enlargement with suggestion of early vascular congestion. No edema or consolidation.  EKG  normal EKG, normal sinus rhythm, unchanged from previous tracings.   Therapy Recommendations CIR  GENERAL EXAM: RESTLESS. Well developed. Neck is supple  CARDIOVASCULAR: Regular rate and rhythm, no murmurs, no carotid bruits  NEUROLOGIC: MENTAL STATUS: awake, alert; SIGNIFICANT EXPRESSIVE > RECEPTIVE APHASIA. ABLE TO FOLLOW SIMPLE COMMANDS. SAYS HIS NAME "[name]". CANNOT NAME THUMB. CANNOT REPEAT "THUMB".  CRANIAL NERVE: pupils equal and reactive to light, RIGHT HOMONYMOUS HEMIANOPSIA. LEFT GAZE PREFERENCE, BUT ABLE TO TRACK PAST MIDLINE TO THE RIGHT. DECR RIGHT LOWER FACIAL STRENGTH. Hearing intact, palate elevates symmetrically, uvula midline, shoulder shrug symmetric, tongue midline. MOTOR: RUE FLACCID 0/5; RLE WITHDRAWAL TO STIM (WITH UPGOING TOE). LUE 4/5. LLE 4/5.  SENSORY: DECR ON RIGHT SIDE COORDINATION: UNABLE IN RIGHT SIDE DUE TO WEAKNESS. LIMITED COMPREHENSION FOR TESTING LEFT SIDE. REFLEXES: deep tendon reflexes TRACE and symmetric GAIT/STATION: BEDREST   ASSESSMENT [name] is a 62 y.o. male presenting with new onset expressive aphasia and right-sided weakness.  Imaging confirms a  large left parietal parenchymal hemorrhage with intraventricular extension and cerebral edema. Hemorrhage felt to be secondary to malignant hypertension with BP 208/122 on admission.  On no antithrombotics prior to admission. Patient with resultant global aphasia,  dysphagia and right hemiplegia, slight improvement from yesterday. Work up underway.   Respiratory distress 12/29, CCM following, trial bipap during the night, they think he likely had OSA prior to admission   Malignant hypertension, not on antihypertensives at home, on cardene drip at 7mg  Hyperlipidemia, LDL 125, on no statin PTA, now on no statin, goal LDL < 100 (< 70 for diabetics) Renal insufficiency, Cr 1.3, borderline UOP leukocytosis 12.2 Hypokalemia, K 3.4 Dysphagia, failed swallow eval yesterday Chews tobacco  Hospital day # 2  TREATMENT/PLAN  Increase SBP goal to less than 180, will start adding oral agents once he is able to swallow and wean nicardipine at that time  Add prn BP meds. Will add antihypertensives per tube once it is placed.  Rehab consult  Consider statin  Repeat CT head this am. Consider MRI, MRA in future  Monitor UOP  Place feeding tube and start tube feedings  Annie Main, MSN, RN, ANVP-BC, ANP-BC, GNP-BC [facility] Pager: [phone] 05/03/2013 9:10 AM  I have personally obtained a history, examined the patient, evaluated imaging results, and formulated the assessment and plan of care. I agree with the above. On nicardipine gtt. Will start PO anti-HTN agents via small bore NG tube. Continue ICU monitoring.  Suanne Marker, MD 05/03/2013, 9:10 AM Certified in Neurology, Neurophysiology and Neuroimaging [facility] - Stroke Team  Please refer to [email or web address] for on-call Stroke MD

## 2013-05-03 NOTE — Progress Notes (Signed)
Physical Therapy Treatment Patient Details Name: [name] MRN: [id number] DOB: [birth year] Today's Date: 05/03/2013 Time: 7829-5621 PT Time Calculation (min): 23 min  PT Assessment / Plan / Recommendation  History of Present Illness 62 y.o. male history of hypertension who was found on the floor of his home early this morning with speech difficulty and unable to move his right side. He was last seen normal at 9:30 PM on 04/30/2013. CT scan of his head showed an 8 cm left parietal hemorrhage with extension into left lateral ventricle. There was mild mass effect as well as mild vasogenic edema. Blood pressure was markedly elevated at 247/132. Cardene drip was started for blood pressure management. NIH stroke score was 22   PT Comments   Pt able to answer yes/no questions appropriately 100% of time this date. Pt remains to have R sided hemiparesis requiring total assist x 2 for all transfers this date. Pt con't to benefit from CIR upon d/c.   Follow Up Recommendations  CIR     Does the patient have the potential to tolerate intense rehabilitation     Barriers to Discharge        Equipment Recommendations       Recommendations for Other Services Rehab consult  Frequency Min 4X/week   Progress towards PT Goals    Plan Current plan remains appropriate    Precautions / Restrictions Precautions Precautions: Fall Precaution Comments: slurred speech Restrictions Weight Bearing Restrictions: No   Pertinent Vitals/Pain Pt did not report pain    Mobility  Bed Mobility Bed Mobility: Supine to Sit;Sit to Supine Supine to Sit: 1: +2 Total assist;HOB flat Supine to Sit: Patient Percentage: 30% Sit to Supine: 1: +2 Total assist;HOB flat Sit to Supine: Patient Percentage: 40% Details for Bed Mobility Assistance: pt initiated transfer with L UE/LE, unable to use R UE/LE functionally Transfers Transfers: Sit to Stand;Stand to Sit Sit to Stand: 1: +2 Total assist;With upper extremity  assist;From bed Sit to Stand: Patient Percentage: 40% Stand to Sit: 1: +2 Total assist;With upper extremity assist;To bed Stand to Sit: Patient Percentage: 40% Details for Transfer Assistance: completed 2 stands, maxAx2 to block L knee and to maintain weight-shift in center due to strong R lateral lean. Pt attempted to step with L LE however unable to maintain R LE in extension without maxA for support Ambulation/Gait Ambulation/Gait Assistance: Not tested (comment) Modified Rankin (Stroke Patients Only) Pre-Morbid Rankin Score: No symptoms Modified Rankin: Severe disability    Exercises     PT Diagnosis:    PT Problem List:   PT Treatment Interventions:     PT Goals (current goals can now be found in the care plan section)    Visit Information  Last PT Received On: 05/03/13 Assistance Needed: +2 History of Present Illness: 62 y.o. male history of hypertension who was found on the floor of his home early this morning with speech difficulty and unable to move his right side. He was last seen normal at 9:30 PM on 04/30/2013. CT scan of his head showed an 8 cm left parietal hemorrhage with extension into left lateral ventricle. There was mild mass effect as well as mild vasogenic edema. Blood pressure was markedly elevated at 247/132. Cardene drip was started for blood pressure management. NIH stroke score was 22    Subjective Data      Cognition  Cognition Arousal/Alertness: Lethargic Behavior During Therapy: Flat affect Overall Cognitive Status: Impaired/Different from baseline Area of Impairment: Attention;Safety/judgement;Problem solving Current Attention  Level: Sustained Safety/Judgement: Decreased awareness of safety Awareness: Intellectual Problem Solving: Slow processing;Difficulty sequencing;Requires verbal cues;Requires tactile cues    Balance  Static Sitting Balance Static Sitting - Balance Support: No upper extremity supported Static Sitting - Level of Assistance:  2: Max assist Static Sitting - Comment/# of Minutes: when L UE placed in lap pt able to maintain midline position x 15 sec prior to leaning R side. when pt uses L UE to "balance" pt with strong R Lateral push  End of Session PT - End of Session Equipment Utilized During Treatment: Gait belt Activity Tolerance: Patient limited by fatigue Patient left: in bed;with call bell/phone within reach;with family/visitor present Nurse Communication: Mobility status   GP     Marcene Brawn 05/03/2013, 12:52 PM  Lewis Shock, PT, DPT Pager #: [phone] Office #: [phone]

## 2013-05-03 NOTE — Progress Notes (Signed)
Occupational Therapy Evaluation Patient Details Name: [name] MRN: [id number] DOB: Dec 02, 1950 Today's Date: 05/03/2013 Time: 7829-5621 OT Time Calculation (min): 17 min  OT Assessment / Plan / Recommendation History of present illness 62 y.o. male history of hypertension who was found on the floor of his home early this morning with speech difficulty and unable to move his right side. He was last seen normal at 9:30 PM on 04/30/2013. CT scan of his head showed an 8 cm left parietal hemorrhage with extension into left lateral ventricle. There was mild mass effect as well as mild vasogenic edema. Blood pressure was markedly elevated at 247/132. Cardene drip was started for blood pressure management. NIH stroke score was 22   Clinical Impression   PTA, pt independent with ADL and mobility. Pt presents with significant functional decline, requiring +2 for mobility and total to max A with ADL. Pt apparently apraxic with R hemiplegia. Feel pt will benefit for CIR to maximize independence with ADL and mobility to facilitate D/c home with 24/7 assistance. Will further assess vision as pt progresses.    OT Assessment  Patient needs continued OT Services    Follow Up Recommendations  CIR    Barriers to Discharge      Equipment Recommendations  3 in 1 bedside comode;Tub/shower bench    Recommendations for Other Services Rehab consult  Frequency  Min 3X/week    Precautions / Restrictions Precautions Precautions: Fall Restrictions Weight Bearing Restrictions: No   Pertinent Vitals/Pain Vitals stable    ADL  Eating/Feeding: NPO Grooming: Maximal assistance Where Assessed - Grooming: Supported sitting Upper Body Bathing: Maximal assistance Where Assessed - Upper Body Bathing: Supported sitting Lower Body Bathing: Maximal assistance Where Assessed - Lower Body Bathing: Supine, head of bed up;Rolling right and/or left Upper Body Dressing: +1 Total assistance Where Assessed - Upper  Body Dressing: Supported sitting Lower Body Dressing: +1 Total assistance Where Assessed - Lower Body Dressing: Supine, head of bed up;Rolling right and/or left Toilet Transfer:  (not assessed) Transfers/Ambulation Related to ADLs: +2     OT Diagnosis: Generalized weakness;Cognitive deficits;Disturbance of vision;Hemiplegia dominant side;Apraxia  OT Problem List: Decreased strength;Decreased range of motion;Decreased activity tolerance;Impaired balance (sitting and/or standing);Impaired vision/perception;Decreased coordination;Decreased cognition;Decreased safety awareness;Decreased knowledge of use of DME or AE;Decreased knowledge of precautions;Cardiopulmonary status limiting activity;Impaired sensation;Impaired tone;Obesity;Impaired UE functional use OT Treatment Interventions: Self-care/ADL training;Therapeutic exercise;Neuromuscular education;DME and/or AE instruction;Therapeutic activities;Cognitive remediation/compensation;Visual/perceptual remediation/compensation;Patient/family education;Balance training   OT Goals(Current goals can be found in the care plan section) Acute Rehab OT Goals Patient Stated Goal: none stated OT Goal Formulation: Patient unable to participate in goal setting Time For Goal Achievement: 05/17/12 Potential to Achieve Goals: Good  Visit Information  Last OT Received On: 05/03/13 Assistance Needed: +2 History of Present Illness: 62 y.o. male history of hypertension who was found on the floor of his home early this morning with speech difficulty and unable to move his right side. He was last seen normal at 9:30 PM on 04/30/2013. CT scan of his head showed an 8 cm left parietal hemorrhage with extension into left lateral ventricle. There was mild mass effect as well as mild vasogenic edema. Blood pressure was markedly elevated at 247/132. Cardene drip was started for blood pressure management. NIH stroke score was 22       Prior Functioning     Home  Living Family/patient expects to be discharged to:: Inpatient rehab Living Arrangements: Spouse/significant other;Children Available Help at Discharge: Family;Available 24 hours/day Type of Home: House  Prior Function Level of Independence: Independent Communication Communication: Expressive difficulties Dominant Hand: Right         Vision/Perception Vision - History Baseline Vision: Wears glasses all the time Vision - Assessment Eye Alignment: Within Functional Limits Vision Assessment: Vision impaired - to be further tested in functional context Additional Comments: decreased tracking to L visual field. Will further assess Perception Perception: Impaired Praxis Praxis: Impaired Praxis Impairment Details: Initiation;Ideomotor;Perseveration   Cognition  Cognition Arousal/Alertness: Lethargic Behavior During Therapy: Flat affect Overall Cognitive Status: Difficult to assess Area of Impairment: Attention;Safety/judgement;Problem solving;Following commands Current Attention Level: Sustained Following Commands: Follows one step commands inconsistently Safety/Judgement: Decreased awareness of safety (restraints as to not pull out lines) Awareness: Intellectual Problem Solving: Slow processing;Difficulty sequencing;Requires verbal cues;Requires tactile cues;Decreased initiation    Extremity/Trunk Assessment Upper Extremity Assessment Upper Extremity Assessment: RUE deficits/detail (non functional at this time) RUE Deficits / Details: pt flaccid, no active mvmt, brunstrom level 1 RUE Sensation: decreased light touch RUE Coordination: decreased fine motor;decreased gross motor Lower Extremity Assessment Lower Extremity Assessment: RLE deficits/detail RLE Deficits / Details: R side flaccid Cervical / Trunk Assessment Cervical / Trunk Assessment:  (will further assess)     Mobility Bed Mobility Bed Mobility: Supine to Sit;Sit to Supine Supine to Sit: 1: +2 Total assist;HOB  flat Supine to Sit: Patient Percentage: 30% Sit to Supine: 1: +2 Total assist;HOB flat Sit to Supine: Patient Percentage: 40% Details for Bed Mobility Assistance: pt initiated transfer with L UE/LE, unable to use R UE/LE functionally Transfers Sit to Stand: 1: +2 Total assist;With upper extremity assist;From bed Sit to Stand: Patient Percentage: 40% Stand to Sit: 1: +2 Total assist;With upper extremity assist;To bed Stand to Sit: Patient Percentage: 40% Details for Transfer Assistance: completed 2 stands, maxAx2 to block L knee and to maintain weight-shift in center due to strong R lateral lean. Pt attempted to step with L LE however unable to maintain R LE in extension without maxA for support     Exercise     Balance Static Sitting Balance Static Sitting - Balance Support: No upper extremity supported Static Sitting - Level of Assistance: 2: Max assist Static Sitting - Comment/# of Minutes: R bias   End of Session OT - End of Session Activity Tolerance: Patient limited by lethargy Patient left: in bed;with call bell/phone within reach Nurse Communication: Mobility status;Other (comment) (letrhargy)  GO     Lafawn Lenoir,HILLARY 05/03/2013, 2:58 PM [facility], OTR/L  [phone] 05/03/2013

## 2013-05-03 NOTE — Progress Notes (Signed)
INITIAL NUTRITION ASSESSMENT  DOCUMENTATION CODES Per approved criteria  -Not Applicable   INTERVENTION: 1. Initiate Vital AF 1.2 at 20 ml/hr. Increase by 10 ml/hr every 4 hours to goal rate of 70 ml/hr to provide 2016 kcal, 126 g protein, 1362 ml free water. 2. Diet advancement per SLP recommendations  NUTRITION DIAGNOSIS: Inadequate oral intake related to inability to eat as evidenced by NPO status.  Goal: Patient will meet >/=90% of estimated nutrition needs  Monitor:  TF advancement and tolerance, diet advancement, weight, labs  Reason for Assessment: Consult, Adult Enteral Nutrition Protocol  62 y.o. male  Admitting Dx: Acute ICH  ASSESSMENT: Patient found at home with speech difficulty and unable to move right side. CT scan of his head showed an 8 cm left parietal hemorrhage with extension into left lateral ventricle. There was mild mass effect as well as mild vasogenic edema.   No recent weight loss or change in appetite. SLP evaluation, recommends MBS. Panda tube placed, KUB shows tip in gastric fundus. Tube feedings have not been started at this time.   Current tube feed order per protocol, Vital AF 1.2 at 40 ml/hr.   BiPAP during sleep.   Height: Ht Readings from Last 1 Encounters:  05/01/13 5\' 10"  (1.778 m)    Weight: Wt Readings from Last 1 Encounters:  05/03/13 189 lb 9.5 oz (86 kg)    Ideal Body Weight: 166 pounds  % Ideal Body Weight: 114%  Wt Readings from Last 10 Encounters:  05/03/13 189 lb 9.5 oz (86 kg)    Usual Body Weight: 190 pounds  % Usual Body Weight: 99%  BMI:  Body mass index is 27.2 kg/(m^2). Patient is overweight  Estimated Nutritional Needs: Kcal: 2000-2200 kcal Protein: 105-125 g Fluid: >2.5 L/day  Skin: Intact  Diet Order: NPO  EDUCATION NEEDS: -No education needs identified at this time   Intake/Output Summary (Last 24 hours) at 05/03/13 1208 Last data filed at 05/03/13 1106  Gross per 24 hour  Intake 2614.17  ml  Output   2885 ml  Net -270.83 ml    Last BM: PTA   Labs:   Recent Labs Lab 05/01/13 0558 05/01/13 0605 05/03/13 0358  NA 141 144 144  K 3.2* 3.5 3.4*  CL 103 103 108  CO2 27  --  23  BUN 11 11 17   CREATININE 1.21 1.30 1.18  CALCIUM 8.7  --  8.3*  GLUCOSE 125* 126* 129*    CBG (last 3)   Recent Labs  05/01/13 0604  GLUCAP 120*    Scheduled Meds: . pantoprazole (PROTONIX) IV  40 mg Intravenous QHS    Continuous Infusions: . sodium chloride 75 mL/hr at 05/02/13 1733  . feeding supplement (VITAL AF 1.2 CAL)    . niCARDipine 7 mg/hr (05/03/13 1136)    Past Medical History  Diagnosis Date  . Hypertension     History reviewed. No pertinent past surgical history.  Linnell Fulling, RD, LDN Pager #: [phone] After-Hours Pager #: [phone]

## 2013-05-03 NOTE — Progress Notes (Addendum)
Error in charting.

## 2013-05-04 ENCOUNTER — Inpatient Hospital Stay (HOSPITAL_COMMUNITY): Payer: Medicare Other

## 2013-05-04 DIAGNOSIS — I619 Nontraumatic intracerebral hemorrhage, unspecified: Secondary | ICD-10-CM | POA: Diagnosis not present

## 2013-05-04 LAB — BASIC METABOLIC PANEL
BUN: 20 mg/dL (ref 6–23)
Calcium: 8.6 mg/dL (ref 8.4–10.5)
Chloride: 109 mEq/L (ref 96–112)
GFR calc Af Amer: >90 mL/min (ref >90)
GFR calc non Af Amer: 88 mL/min — ABNORMAL LOW (ref >90)
Potassium: 3.5 mEq/L — ABNORMAL LOW (ref 3.7–5.3)

## 2013-05-04 LAB — CBC
Hemoglobin: 14.5 g/dL (ref 13.0–17.0)
MCH: 30.3 pg (ref 26.0–34.0)
MCHC: 33.6 g/dL (ref 30.0–36.0)
Platelets: 180 10*3/uL (ref 150–400)
RDW: 12.9 % (ref 11.5–15.5)

## 2013-05-04 LAB — GLUCOSE, CAPILLARY
Glucose-Capillary: 107 mg/dL — ABNORMAL HIGH (ref 70–99)
Glucose-Capillary: 96 mg/dL (ref 70–99)
Glucose-Capillary: 127 mg/dL — ABNORMAL HIGH (ref 70–99)
Glucose-Capillary: 137 mg/dL — ABNORMAL HIGH (ref 70–99)

## 2013-05-04 MED ORDER — POTASSIUM CHLORIDE 10 MEQ/100ML IV SOLN
10.0000 meq | INTRAVENOUS | Status: AC
  Administered 2013-05-04 (×2): 10 meq via INTRAVENOUS
  Filled 2013-05-04 (×2): qty 100

## 2013-05-04 MED ORDER — AMLODIPINE BESYLATE 10 MG PO TABS
10.0000 mg | ORAL_TABLET | Freq: Every day | ORAL | Status: DC
  Administered 2013-05-04 – 2013-05-06 (×3): 10 mg via ORAL
  Filled 2013-05-04 (×3): qty 1

## 2013-05-04 NOTE — Procedures (Signed)
Objective Swallowing Evaluation: Modified Barium Swallowing Study  Patient Details  Name: [name] MRN: [id number] Date of Birth: 05-23-1950  Today's Date: 05/04/2013 Time: 7829-5621 SLP Time Calculation (min): 20 min  Past Medical History:  Past Medical History  Diagnosis Date  . Hypertension    Past Surgical History: History reviewed. No pertinent past surgical history. HPI:  62 y/o male with history of HTN found on floor of his home early in AM 05/01/13 with speech difficulty and inability to move his right side. CT scan showed 8cm left parietal hemorrhage with extension into left lateral ventricle.  Mild mass effect as well as mild vasogenic edema.  Pt. adequately alert and able to participate in MBS.      Assessment / Plan / Recommendation Clinical Impression  Dysphagia Diagnosis: Moderate oral phase dysphagia;Moderate pharyngeal phase dysphagia;Severe oral phase dysphagia Clinical impression: Pt. exhibited moderate-severe oral dysphagia with right poor cohesion, manipulation and sensation leading to right buccal cavity pocketing, leakage and delayed transit.  Pharyngeal phase dysphagia is primarily sensory based with a mild motor component and delayed swallow to lower pharynx ranging from 5-20 seconds.  No significant difference between nectar and honey thick pharygneal residue, in addition, honey barium provided for a slower and more cohesive entrance to pyriform sinuses.  Laryngeal penetration observed with thin (reflexive cough), nectar and honey (flash).  Pt. required max verbal and (intermittent) tactile cues to initiate second swallow (dry spoon).  Pt. continues with a moderately high aspiration risk, however Dys 1 diet texture and honey thick liquids, full supervision and pills crushed  recommended to decrease risk. ST services to continue on acute care.           Treatment Recommendation  Therapy as outlined in treatment plan below    Diet Recommendation Dysphagia 1  (Puree);Honey-thick liquid   Liquid Administration via: Cup;No straw Medication Administration: Crushed with puree Supervision: Full supervision/cueing for compensatory strategies;Staff to assist with self feeding Compensations: Slow rate;Small sips/bites;Clear throat intermittently (allow time for first swallow) Postural Changes and/or Swallow Maneuvers: Seated upright 90 degrees;Upright 30-60 min after meal    Other  Recommendations Oral Care Recommendations: Oral care Q4 per protocol Other Recommendations: Order thickener from pharmacy   Follow Up Recommendations  Inpatient Rehab    Frequency and Duration min 2x/week  2 weeks   Pertinent Vitals/Pain WDL          Reason for Referral Objectively evaluate swallowing function   Oral Phase Oral Preparation/Oral Phase Oral Phase: Impaired Oral - Honey Oral - Honey Teaspoon: Right anterior bolus loss;Weak lingual manipulation;Piecemeal swallowing;Right pocketing in lateral sulci;Delayed oral transit Oral - Honey Cup: Right anterior bolus loss;Weak lingual manipulation;Piecemeal swallowing;Right pocketing in lateral sulci;Delayed oral transit Oral - Nectar Oral - Nectar Teaspoon: Right anterior bolus loss;Weak lingual manipulation;Piecemeal swallowing;Right pocketing in lateral sulci;Delayed oral transit Oral - Nectar Cup: Right anterior bolus loss;Weak lingual manipulation;Piecemeal swallowing;Right pocketing in lateral sulci;Delayed oral transit Oral - Thin Oral - Thin Teaspoon: Right anterior bolus loss;Weak lingual manipulation;Piecemeal swallowing;Right pocketing in lateral sulci;Delayed oral transit Oral - Solids Oral - Puree: Delayed oral transit;Right pocketing in lateral sulci   Pharyngeal Phase Pharyngeal Phase Pharyngeal Phase: Impaired Pharyngeal - Honey Pharyngeal - Honey Teaspoon: Pharyngeal residue - pyriform sinuses;Pharyngeal residue - valleculae;Premature spillage to valleculae;Within functional limits;Delayed  swallow initiation Pharyngeal - Honey Cup: Delayed swallow initiation;Premature spillage to valleculae;Premature spillage to pyriform sinuses;Pharyngeal residue - valleculae;Pharyngeal residue - pyriform sinuses;Penetration/Aspiration during swallow Penetration/Aspiration details (honey cup): Material enters airway, remains ABOVE  vocal cords then ejected out Pharyngeal - Nectar Pharyngeal - Nectar Teaspoon: Delayed swallow initiation;Premature spillage to pyriform sinuses;Pharyngeal residue - valleculae;Pharyngeal residue - pyriform sinuses;Reduced tongue base retraction;Reduced laryngeal elevation;Penetration/Aspiration during swallow Penetration/Aspiration details (nectar teaspoon): Material enters airway, remains ABOVE vocal cords then ejected out Pharyngeal - Nectar Cup: Delayed swallow initiation;Premature spillage to pyriform sinuses;Penetration/Aspiration during swallow (deeper penetration) Penetration/Aspiration details (nectar cup): Material enters airway, CONTACTS cords then ejected out Pharyngeal - Thin Pharyngeal - Thin Teaspoon: Delayed swallow initiation;Premature spillage to valleculae;Premature spillage to pyriform sinuses;Penetration/Aspiration during swallow (from pyriform sinus residue) Penetration/Aspiration details (thin teaspoon): Material enters airway, CONTACTS cords then ejected out Pharyngeal - Solids Pharyngeal - Puree: Delayed swallow initiation;Pharyngeal residue - valleculae;Reduced tongue base retraction  Cervical Esophageal Phase    GO    Cervical Esophageal Phase Cervical Esophageal Phase: Leonarda Salon         Darrow Bussing.Ed [employer] [phone]  05/04/2013

## 2013-05-04 NOTE — Progress Notes (Signed)
Name: [name] MRN: [id number] DOB: 04/15/1951    ADMISSION DATE:  05/01/2013 CONSULTATION DATE:  05/02/2013  REFERRING MD :  Noel Christmas  CHIEF COMPLAINT:  Respiratory distress  BRIEF PATIENT DESCRIPTION:  62 yo male smoker admitted with ICH 12/28.  Developed paradoxical breathing pattern, and PCCM consulted to assess respiratory status.  SIGNIFICANT EVENTS:  STUDIES:  12/28 CT head >> 4.6 cm ICH Lt parietal lobe, mild vasogenic edema, minimal mid-line shift, diffuse small vessel ischemic microangiopathy; chronic lacunar infarct within the inferior right basal ganglia.  12/29 Echo >> EF 60 to 65%, mild MR, mod LA dilation  LINES / TUBES: PIV  CULTURES: None  ANTIBIOTICS: None    SUBJECTIVE: Afebrile No obvious pain or dyspnea  VITAL SIGNS: Temp:  [98 F (36.7 C)-99.3 F (37.4 C)] 98 F (36.7 C) (12/31 0400) Pulse Rate:  [68-108] 83 (12/31 0800) Resp:  [17-26] 22 (12/31 0800) BP: (141-176)/(62-96) 167/87 mmHg (12/31 0800) SpO2:  [94 %-99 %] 97 % (12/31 0800) Weight:  [86 kg (189 lb 9.5 oz)-86.3 kg (190 lb 4.1 oz)] 86.3 kg (190 lb 4.1 oz) (12/31 0500) HEMODYNAMICS:   VENTILATOR SETTINGS:   INTAKE / OUTPUT: Intake/Output     12/30 0701 - 12/31 0700 12/31 0701 - 01/01 0700   I.V. (mL/kg) 2098.9 (24.3) 75 (0.9)   NG/GT 660 60   IV Piggyback 300 100   Total Intake(mL/kg) 3058.9 (35.4) 235 (2.7)   Urine (mL/kg/hr) 2445 (1.2) 350 (1.1)   Total Output 2445 350   Net +613.9 -115          PHYSICAL EXAMINATION: General: tachypneic Neuro:  Aphasic, rt hemineglect & hemiplegia, left 4/5 HEENT:  Pupils reactive, no sinus tenderness Cardiovascular: regular, no murmur Lungs:  Decreased breath sounds, no wheeze Abdomen: soft, non tender Musculoskeletal:  No edema Skin:  No rashes  LABS:  CBC  Recent Labs Lab 05/01/13 0558 05/01/13 0605 05/03/13 0358 05/04/13 0346  WBC 9.0  --  12.2* 13.4*  HGB 16.6 17.0 14.1 14.5  HCT 46.9 50.0 42.1 43.2   PLT 162  --  190 180   Coag's  Recent Labs Lab 05/01/13 0558  APTT 30  INR 1.04   BMET  Recent Labs Lab 05/01/13 0558 05/01/13 0605 05/03/13 0358 05/04/13 0346  NA 141 144 144 145  K 3.2* 3.5 3.4* 3.5*  CL 103 103 108 109  CO2 27  --  23 24  BUN 11 11 17 20   CREATININE 1.21 1.30 1.18 0.94  GLUCOSE 125* 126* 129* 106*   Electrolytes  Recent Labs Lab 05/01/13 0558 05/03/13 0358 05/04/13 0346  CALCIUM 8.7 8.3* 8.6   Sepsis Markers No results found for this basename: LATICACIDVEN, PROCALCITON, O2SATVEN,  in the last 168 hours ABG  Recent Labs Lab 05/02/13 2220  PHART 7.423  PCO2ART 37.5  PO2ART 73.0*   Liver Enzymes  Recent Labs Lab 05/01/13 0558  AST 18  ALT 21  ALKPHOS 75  BILITOT 0.6  ALBUMIN 3.3*   Cardiac Enzymes  Recent Labs Lab 05/01/13 0558  TROPONINI <0.30   Glucose  Recent Labs Lab 05/01/13 0604 05/03/13 1634 05/03/13 1954 05/04/13 0039 05/04/13 0355 05/04/13 0809  GLUCAP 120* 104* 103* 96 107* 101*    Imaging Ct Head Wo Contrast  05/03/2013   CLINICAL DATA:  Intracranial hemorrhage. Follow-up. Hypertension. Patient more restless today.  EXAM: CT HEAD WITHOUT CONTRAST  TECHNIQUE: Contiguous axial images were obtained from the base of the skull through the  vertex without intravenous contrast.  COMPARISON:  Most recent examination 05/02/2013.  FINDINGS: Left hemispheric hematoma centered at the left basal ganglia appears of similar size to the recent examination measuring 4.5 x 4.2 cm. Mild surrounding vasogenic edema. Mass effect upon the left lateral ventricle with minimal bowing of the septum towards the right unchanged from prior exam. Follow-up until clearance recommended.  Extension of hemorrhage into the ventricles with blood more notable within the left ventricle. The amount of blood within the right ventricle appears less prominent than on the recent exam. Size of the right ventricles unchanged.  Remote infarct right  caudate head. Small vessel disease type changes without CT evidence of large acute thrombotic infarct.  Cystic structure medial aspect of the right temporal lobe/ hippocampus unchanged.  Right cerebellar tonsil may be minimally low lying.  Paranasal sinus mucosal thickening unchanged.  IMPRESSION: Overall no significant change since the prior examination with the possible exception of decreased amount of blood within the dependent aspect of the right lateral ventricle. Please see above.   Electronically Signed   By: Bridgett Larsson M.D.   On: 05/03/2013 10:24   Dg Chest Port 1 View  05/02/2013   CLINICAL DATA:  Respiratory distress.  EXAM: PORTABLE CHEST - 1 VIEW  COMPARISON:  01/09/2007  FINDINGS: Shallow inspiration. Cardiac enlargement with mild pulmonary vascular congestion, developing since prior study. No focal airspace disease or edema. No blunting of costophrenic angles. No pneumothorax. Postoperative changes in the cervical spine.  IMPRESSION: Shallow inspiration. Cardiac enlargement with suggestion of early vascular congestion. No edema or consolidation.   Electronically Signed   By: Burman Nieves M.D.   On: 05/02/2013 22:30   Dg Abd Portable 1v  05/03/2013   CLINICAL DATA:  Panda tube placement  EXAM: PORTABLE ABDOMEN - 1 VIEW  COMPARISON:  Chest radiograph - 05/02/2013  FINDINGS: Weighted tip enteric tube projects over the gastric fundus.  There is a paucity of bowel gas.  Limited visualization of the lower thorax demonstrates an enlarged cardiac silhouette. There is mild elevation of the right hemidiaphragm.  No definite acute osseus abnormalities.  IMPRESSION: Weighted tip enteric tube projects over the gastric fundus.   Electronically Signed   By: Simonne Come M.D.   On: 05/03/2013 12:01    ASSESSMENT / PLAN:  PULMONARY A: Acute respiratory distress 12/29. Hx of smoking. ?underlying OSA P:   -oxygen to keep SpO2 > 92% -prn BD's - CPAP during sleep -smoking  cessation  NEUROLOGIC A:   ICH with aphasia, and Rt sided weakness. P:   -per neurology  CARDIOVASCULAR A:  Malignant HTN. P:  -Off cardene gtt  -can use labetalol & hydralazine prn -  RENAL A:   No issues. P:   -monitor renal fx, urine outpt, electrolytes -replete K  GASTROINTESTINAL A:   Dysphagia. P:   -NPO, placed panda for TFs -protonix for SUP  HEMATOLOGIC A:   No issues. P:  -f/u CBC -SCD's for DVT prevention  INFECTIOUS A:   At risk for aspiration. P:   -monitor clinically off Abx for now  ENDOCRINE A:   No issues. P:   -monitor blood sugar on BMET  PCCM will sign off CPAP during sleep, consider outpt sleep study based on rehab outcome   Cyril Mourning MD. FCCP. [facility] Pager [phone] If no response call [phone]    05/04/2013, 10:35 AM

## 2013-05-04 NOTE — Progress Notes (Signed)
Pt BP 188/120 at 1757; 20mg  labetolol given IV per order.  Repeat manual BP check at 1818 was 170/100.  Pt denied headache or any discomfort.  Asymptomatic. Resting quietly.  Will continue to monitor for changes.

## 2013-05-04 NOTE — Progress Notes (Signed)
Speech Language Pathology Treatment: Dysphagia;Cognitive-Linquistic  Patient Details Name: [name] MRN: [id number] DOB: 14-Sep-1950 Today's Date: 05/04/2013 Time: 0915-0930 SLP Time Calculation (min): 15 min  Assessment / Plan / Recommendation Clinical Impression  Co-treatment completed with PT focusing on cognition and receptive language skills.  Moderate multi-model cues required for patient to maintain sustained attention and correctly sequence target ADL.  Improvement in receptive skills as demonstrates ability to follow 1 to 2 step simple verbal directives.  Strategies of repetition, simplifying directions, and providing extended response time effective in increasing comprehension.  Dysphagia treatment also completed with patient sitting EOB with max of 2 to maintain sitting balance.  No outward clinical s/s of aspiration noted with trials of ice chips , thin water by spoon, and puree.  Severe anterior spillage on right with attempts to administer sips of water by cup.  No outward clinical s/s of aspiration noted with PO trials but recommend to proceed with objective evaluation of MBS prior to initiating PO diet secondary to extent of deficits to rule out silent aspiration.  MBS to be completed this date.    HPI HPI: 62 y/o male with history of HTN found on floor of his home early in AM 05/01/13 with speech difficulty and inability to move his right side. CT scan showed 8cm left parietal hemorrhage with extension into left lateral ventricle.  Mild mass effect as well as mild vasogenic edema.       SLP Plan  MBS;New goals to be determined pending instrumental study    Recommendations Diet recommendations: NPO Medication Administration: Via alternative means              General recommendations: Rehab consult Oral Care Recommendations: Oral care Q4 per protocol Follow up Recommendations: Inpatient Rehab Plan: MBS;New goals to be determined pending instrumental study    GO     [name] MS, CCC-SLP [phone] [facility] 05/04/2013, 9:55 AM

## 2013-05-04 NOTE — Progress Notes (Signed)
Physical Therapy Treatment Patient Details Name: [name] MRN: [id number] DOB: [birth year] Today's Date: 05/04/2013 Time: 4098-1191 PT Time Calculation (min): 28 min  PT Assessment / Plan / Recommendation  History of Present Illness 62 y.o. male history of hypertension who was found on the floor of his home early this morning with speech difficulty and unable to move his right side. He was last seen normal at 9:30 PM on 04/30/2013. CT scan of his head showed an 8 cm left parietal hemorrhage with extension into left lateral ventricle. There was mild mass effect as well as mild vasogenic edema. Blood pressure was markedly elevated at 247/132. Cardene drip was started for blood pressure management. NIH stroke score was 22   PT Comments   Pt with significant improvement cognitive. Pt oriented and responding appropriately to questions and following simple command consistently. Pt con't to have R sided neglect and R hemi paresis requiring maximal assist for all mobility. Pt con't to be appropriate for CIR upon d/c.  Follow Up Recommendations  CIR     Does the patient have the potential to tolerate intense rehabilitation     Barriers to Discharge        Equipment Recommendations       Recommendations for Other Services Rehab consult  Frequency Min 4X/week   Progress towards PT Goals Progress towards PT goals: Progressing toward goals  Plan Current plan remains appropriate    Precautions / Restrictions Precautions Precautions: Fall Precaution Comments: R sided inattention, R pusher Restrictions Weight Bearing Restrictions: No   Pertinent Vitals/Pain Denies pain    Mobility  Bed Mobility Bed Mobility: Supine to Sit;Sit to Supine Supine to Sit: 1: +2 Total assist;HOB flat Supine to Sit: Patient Percentage: 50% Sit to Supine: 1: +2 Total assist;HOB flat Sit to Supine: Patient Percentage: 50% Details for Bed Mobility Assistance: pt initiated transfer with L UE/LE, unable to use R  UE/LE functionally Transfers Transfers: Sit to Stand;Stand to Sit Sit to Stand: 1: +2 Total assist;With upper extremity assist;From bed Sit to Stand: Patient Percentage: 50% Stand to Sit: 1: +2 Total assist;With upper extremity assist;To bed Stand to Sit: Patient Percentage: 50% Details for Transfer Assistance: Pt with improved abiltity to stand however impulsive trying to take steps with L LE requiring max R knee blocking to prevent buckling. Ambulation/Gait Ambulation/Gait Assistance: Not tested (comment) Modified Rankin (Stroke Patients Only) Pre-Morbid Rankin Score: No symptoms Modified Rankin: Severe disability    Exercises     PT Diagnosis:    PT Problem List:   PT Treatment Interventions:     PT Goals (current goals can now be found in the care plan section)    Visit Information  Last PT Received On: 05/04/13 Assistance Needed: +2 PT/OT/SLP Co-Evaluation/Treatment: Yes Reason for Co-Treatment: Complexity of the patient's impairments (multi-system involvement);Necessary to address cognition/behavior during functional activity PT goals addressed during session: Balance SLP goals addressed during session: Swallowing;Cognition;Communication History of Present Illness: 62 y.o. male history of hypertension who was found on the floor of his home early this morning with speech difficulty and unable to move his right side. He was last seen normal at 9:30 PM on 04/30/2013. CT scan of his head showed an 8 cm left parietal hemorrhage with extension into left lateral ventricle. There was mild mass effect as well as mild vasogenic edema. Blood pressure was markedly elevated at 247/132. Cardene drip was started for blood pressure management. NIH stroke score was 22    Subjective Data  Cognition  Cognition Arousal/Alertness: Awake/alert Behavior During Therapy: WFL for tasks assessed/performed Overall Cognitive Status: Impaired/Different from baseline Area of Impairment:  Safety/judgement;Awareness;Problem solving Current Attention Level: Sustained Following Commands: Follows one step commands consistently Safety/Judgement: Decreased awareness of safety;Decreased awareness of deficits Awareness: Intellectual Problem Solving: Requires verbal cues;Requires tactile cues    Balance  Static Sitting Balance Static Sitting - Balance Support: Left upper extremity supported;Feet supported Static Sitting - Level of Assistance: 1: +2 Total assist Static Sitting - Comment/# of Minutes: sat EOB x 15 min. pt with strong R Lateral lean/pushing with L hand  requiring freq v/c's to place L UE in lap. max tactile cues at trunk to obtain midline  End of Session PT - End of Session Equipment Utilized During Treatment: Gait belt Activity Tolerance: Patient limited by fatigue Patient left: in bed;with call bell/phone within reach;with family/visitor present Nurse Communication: Mobility status   GP     Marcene Brawn 05/04/2013, 10:10 AM  Lewis Shock, PT, DPT Pager #: [phone] Office #: [phone]

## 2013-05-04 NOTE — Progress Notes (Signed)
[facility] ADULT ICU REPLACEMENT PROTOCOL FOR AM LAB REPLACEMENT ONLY  The patient does apply for the [facility] Adult ICU Electrolyte Replacment Protocol based on the criteria listed below:   1. Is GFR >/= 40 ml/min? yes  Patient's GFR today is 88 2. Is urine output >/= 0.5 ml/kg/hr for the last 6 hours? yes Patient's UOP is 0.92 ml/kg/hr 3. Is BUN < 60 mg/dL? yes  Patient's BUN today is 20 4. Abnormal electrolyte(s): K+ 3.5 5. Ordered repletion with: see order 6. If a panic level lab has been reported, has the CCM MD in charge been notified? yes.   Physician:  Dr. Frances Nickels, Takeisha Cianci A 05/04/2013 6:20 AM

## 2013-05-04 NOTE — Progress Notes (Signed)
Rehab admissions - Evaluated for possible admission.  I spoke briefly with significant other and "step daughter".  They would like inpatient rehab admission and then plan to care for patient at home after rehab if possible.  I will follow progress and then check on Friday for potential inpatient rehab admission.  Call me for questions.   #[phone]

## 2013-05-04 NOTE — Progress Notes (Addendum)
Pt admitted to 4N4.  Alert, denies any pain or discomfort.  Left wrist restraint on and secured to prevent pulling out panda tube.  Bed alarm set.  BP on arrival 179/97 pulse 79.  Will continue to monitor

## 2013-05-04 NOTE — Progress Notes (Signed)
Attempted to place Pt c-pap mask it was off and on the bed pt refused Ilean Skill LPN

## 2013-05-04 NOTE — Progress Notes (Signed)
Stroke Team Progress Note  HISTORY 62 y.o. male history of hypertension who was found on the floor of his home early this morning 05/01/2013 with speech difficulty and inability to move his right side. He was last seen normal at 9:30 PM on 04/30/2013. CT scan of his head showed an 8 cm left parietal hemorrhage with extension into left lateral ventricle. There was mild mass effect as well as mild vasogenic edema. Blood pressure was markedly elevated at 247/132. Cardene drip was started for blood pressure management. NIH stroke score was 22. Patient was not a TPA candidate secondary to hemorrhage. He was admitted to the neuro ICU for further evaluation and treatment.  SUBJECTIVE Wife and ?mother-in-law at the bedside.   OBJECTIVE Most recent Vital Signs: Filed Vitals:   05/04/13 0500 05/04/13 0600 05/04/13 0700 05/04/13 0800  BP: 170/96 170/87 170/87 167/87  Pulse: 77 88 91 83  Temp:      TempSrc:      Resp: 21 21 22 22   Height:      Weight: 86.3 kg (190 lb 4.1 oz)     SpO2: 97% 98% 99% 97%   CBG (last 3)   Recent Labs  05/03/13 1954 05/04/13 0039 05/04/13 0355  GLUCAP 103* 96 107*    IV Fluid Intake:   . sodium chloride 75 mL/hr at 05/04/13 0800  . feeding supplement (VITAL AF 1.2 CAL) 1,000 mL (05/04/13 0600)  . niCARDipine Stopped (05/03/13 1715)    MEDICATIONS  . amLODipine  5 mg Oral Daily  . antiseptic oral rinse  15 mL Mouth Rinse q12n4p  . chlorhexidine  15 mL Mouth Rinse BID  . pantoprazole (PROTONIX) IV  40 mg Intravenous QHS  . potassium chloride  10 mEq Intravenous Q1 Hr x 2   PRN:  acetaminophen, acetaminophen, labetalol  Diet:  NPO  Activity:  Up with assistance DVT Prophylaxis:  SCDs  CLINICALLY SIGNIFICANT STUDIES Basic Metabolic Panel:   Recent Labs Lab 05/03/13 0358 05/04/13 0346  NA 144 145  K 3.4* 3.5*  CL 108 109  CO2 23 24  GLUCOSE 129* 106*  BUN 17 20  CREATININE 1.18 0.94  CALCIUM 8.3* 8.6   Liver Function Tests:   Recent  Labs Lab 05/01/13 0558  AST 18  ALT 21  ALKPHOS 75  BILITOT 0.6  PROT 6.6  ALBUMIN 3.3*   CBC:   Recent Labs Lab 05/01/13 0558  05/03/13 0358 05/04/13 0346  WBC 9.0  --  12.2* 13.4*  NEUTROABS 6.6  --   --   --   HGB 16.6  < > 14.1 14.5  HCT 46.9  < > 42.1 43.2  MCV 87.7  --  89.8 90.4  PLT 162  --  190 180  < > = values in this interval not displayed. Coagulation:   Recent Labs Lab 05/01/13 0558  LABPROT 13.4  INR 1.04   Cardiac Enzymes:   Recent Labs Lab 05/01/13 0558  TROPONINI <0.30   Urinalysis:   Recent Labs Lab 05/01/13 0559  COLORURINE YELLOW  LABSPEC 1.015  PHURINE 7.5  GLUCOSEU NEGATIVE  HGBUR SMALL*  BILIRUBINUR NEGATIVE  KETONESUR NEGATIVE  PROTEINUR >300*  UROBILINOGEN 0.2  NITRITE NEGATIVE  LEUKOCYTESUR NEGATIVE   Lipid Panel     Component Value Date/Time   CHOL 192 05/01/2013 1445   TRIG 99 05/01/2013 1445   HDL 47 05/01/2013 1445   CHOLHDL 4.1 05/01/2013 1445   VLDL 20 05/01/2013 1445   LDLCALC 125* 05/01/2013 1445  HgbA1C  Lab Results  Component Value Date   HGBA1C 5.5 05/01/2013    Urine Drug Screen:     Component Value Date/Time   LABOPIA NONE DETECTED 05/01/2013 0559   COCAINSCRNUR NONE DETECTED 05/01/2013 0559   LABBENZ NONE DETECTED 05/01/2013 0559   AMPHETMU NONE DETECTED 05/01/2013 0559   THCU NONE DETECTED 05/01/2013 0559   LABBARB NONE DETECTED 05/01/2013 0559    Alcohol Level:   Recent Labs Lab 05/01/13 0558  ETH <11    CT of the brain 05/03/2013 Overall no significant change since the prior examination with the possible exception of decreased amount of blood within the dependent aspect of the right lateral ventricle.  05/02/2013    Left hemispheric hemorrhage and associated mass effect as detailed above. When compared to prior examination, the amount of blood within the dependent aspect of the right lateral ventricle has increased.  Mild prominence of the right lateral ventricle unchanged from  prior exam. Attention to this on follow up.    05/01/2013   1. Large 4.6 x 3.9 cm intraparenchymal hematoma at the left parietal lobe, adjacent to the left basal ganglia and thalamus. Extension of blood into the left lateral ventricle, with mild associated vasogenic edema. There is minimal if any midline shift at this time. Slight effacement of the left lateral ventricle, without evidence for obstruction. 2. Diffuse small vessel ischemic microangiopathy; chronic lacunar infarct within the inferior right basal ganglia. 3. Mild mucosal thickening within the maxillary sinuses.   MRI of the brain    MRA of the brain    2D Echocardiogram  EF 60-65% with no source of embolus.   Carotid Doppler  No evidence of hemodynamically significant internal carotid artery stenosis. Vertebral artery flow is antegrade.   CXR  05/02/2013 Shallow inspiration. Cardiac enlargement with suggestion of early vascular congestion. No edema or consolidation.  EKG  normal EKG, normal sinus rhythm, unchanged from previous tracings.   Therapy Recommendations CIR  GENERAL EXAM: RESTLESS. Well developed. Neck is supple  CARDIOVASCULAR: Regular rate and rhythm, no murmurs, no carotid bruits  NEUROLOGIC: MENTAL STATUS: awake, alert; SIGNIFICANT EXPRESSIVE > RECEPTIVE APHASIA. ABLE TO FOLLOWrepeats "today is a sun" but unable to fully finish sentence of "today is a sunny day". Follows simple commands with repeated requests (open/close eyes, show thumb on left) Not following commands on R side. Shows 2 fingers on LUE to visual command but not verbal CRANIAL NERVE: pupils equal and reactive to light, RIGHT HOMONYMOUS HEMIANOPSIA. LEFT GAZE PREFERENCE, BUT ABLE TO TRACK PAST MIDLINE TO THE RIGHT. DECR RIGHT LOWER FACIAL STRENGTH. Hearing intact, palate elevates symmetrically, uvula midline, shoulder shrug symmetric, tongue midline. MOTOR: RUE FLACCID 0/5; RLE WITHDRAWAL TO STIM (WITH UPGOING TOE). LUE 4/5. LLE 4/5.  SENSORY: DECR  ON RIGHT SIDE COORDINATION: UNABLE IN RIGHT SIDE DUE TO WEAKNESS. LIMITED COMPREHENSION FOR TESTING LEFT SIDE. REFLEXES: deep tendon reflexes TRACE and symmetric GAIT/STATION: BEDREST   ASSESSMENT Mr. ISAIC SYLER is a 62 y.o. male presenting with new onset expressive aphasia and right-sided weakness. Imaging confirms a  large left parietal parenchymal hemorrhage with intraventricular extension and cerebral edema. Hemorrhage felt to be secondary to malignant hypertension with BP 208/122 on admission.  On no antithrombotics prior to admission. Patient with resultant global aphasia, dysphagia and right hemiplegia, continues to be more awake with improvement. Work up completed.   Respiratory distress 12/29, CCM following, trial bipap during the past 2 nights, they think he likely had OSA prior to admission  Malignant hypertension, not on antihypertensives at home, on cardene drip at 7mg . norvasc added once tube placed for feeding 12/20. Off cardene this am. Hyperlipidemia, LDL 125, on no statin PTA, now on no statin, goal LDL < 100 (< 70 for diabetics) Renal insufficiency, Cr 1.3, borderline UOP leukocytosis 12.2 Hypokalemia, K 3.5, 2 runs this am Dysphagia, failed swallow eval, flexible feeding tube placed and tube feedings started Chews tobacco  Hospital day # 3  TREATMENT/PLAN  Transfer to the floor  Transfer to rehab once bed available  add statin at discharge  Increase norvasc to 10 mg daily  Annie Main, MSN, RN, ANVP-BC, ANP-BC, GNP-BC [facility] Pager: [phone] 05/04/2013 8:41 AM  I have personally obtained a history, examined the patient, evaluated imaging results, and formulated the assessment and plan of care.  This patient is critically ill and at significant risk of neurological worsening, death and care requires constant monitoring of vital signs, hemodynamics,respiratory and cardiac monitoring, neurological assessment, discussion with family,  other specialists and medical decision making of high complexity.   Elspeth Cho, DO Neurology-Stroke

## 2013-05-04 NOTE — Progress Notes (Signed)
Patient ready to come off bipap. RT removed bipap and placed on 4lpm nasal cannula. Patient is tolerating  well at this time, RR 21, sat 100%, and HR 86. RT will continue to monitor.

## 2013-05-05 ENCOUNTER — Inpatient Hospital Stay (HOSPITAL_COMMUNITY): Payer: Medicare Other

## 2013-05-05 DIAGNOSIS — R0602 Shortness of breath: Secondary | ICD-10-CM | POA: Diagnosis not present

## 2013-05-05 LAB — BASIC METABOLIC PANEL
BUN: 19 mg/dL (ref 6–23)
CO2: 20 mEq/L (ref 19–32)
Calcium: 9 mg/dL (ref 8.4–10.5)
Chloride: 102 mEq/L (ref 96–112)
Creatinine, Ser: 0.93 mg/dL (ref 0.50–1.35)
GFR calc Af Amer: >90 mL/min (ref >90)
GFR calc non Af Amer: 88 mL/min — ABNORMAL LOW (ref >90)
Glucose, Bld: 100 mg/dL — ABNORMAL HIGH (ref 70–99)
Potassium: 3.5 mEq/L — ABNORMAL LOW (ref 3.7–5.3)
Sodium: 141 mEq/L (ref 137–147)

## 2013-05-05 MED ORDER — POTASSIUM CHLORIDE 10 MEQ/100ML IV SOLN
10.0000 meq | INTRAVENOUS | Status: DC
  Administered 2013-05-05: 10 meq via INTRAVENOUS
  Filled 2013-05-05 (×2): qty 100

## 2013-05-05 MED ORDER — HYDROCHLOROTHIAZIDE 25 MG PO TABS
25.0000 mg | ORAL_TABLET | Freq: Once | ORAL | Status: AC
  Administered 2013-05-05: 25 mg via ORAL
  Filled 2013-05-05: qty 1

## 2013-05-05 MED ORDER — ENOXAPARIN SODIUM 40 MG/0.4ML ~~LOC~~ SOLN
40.0000 mg | SUBCUTANEOUS | Status: DC
  Administered 2013-05-05 – 2013-05-06 (×2): 40 mg via SUBCUTANEOUS
  Filled 2013-05-05 (×2): qty 0.4

## 2013-05-05 MED ORDER — PANTOPRAZOLE SODIUM 40 MG PO PACK
40.0000 mg | PACK | Freq: Every day | ORAL | Status: DC
  Administered 2013-05-05 – 2013-05-06 (×2): 40 mg
  Filled 2013-05-05 (×2): qty 20

## 2013-05-05 MED ORDER — HYDROCHLOROTHIAZIDE 12.5 MG PO CAPS
12.5000 mg | ORAL_CAPSULE | Freq: Every day | ORAL | Status: DC
  Administered 2013-05-05 – 2013-05-06 (×2): 12.5 mg via ORAL
  Filled 2013-05-05 (×2): qty 1

## 2013-05-05 MED ORDER — LABETALOL HCL 5 MG/ML IV SOLN
10.0000 mg | Freq: Once | INTRAVENOUS | Status: AC
Start: 2013-05-05 — End: 2013-05-05
  Administered 2013-05-05: 10 mg via INTRAVENOUS
  Filled 2013-05-05: qty 4

## 2013-05-05 MED ORDER — POTASSIUM CHLORIDE CRYS ER 20 MEQ PO TBCR
20.0000 meq | EXTENDED_RELEASE_TABLET | Freq: Two times a day (BID) | ORAL | Status: DC
  Administered 2013-05-05 – 2013-05-06 (×3): 20 meq via ORAL
  Filled 2013-05-05 (×4): qty 1

## 2013-05-05 NOTE — Progress Notes (Signed)
Pt breathing improving from beginning of shift. During sleep patients respirations increase, refusing to wear CPAP at this time. While awake deep breathing techniques taught and patient able to follow command and preform deep breathing. No signs of respiratory distress at this time. Will continue to monitor. Messer, Ivin BootyEllie A. RN

## 2013-05-05 NOTE — Progress Notes (Signed)
Stroke Team Progress Note  HISTORY 63 y.o. male history of hypertension who was found on the floor of his home early this morning 05/01/2013 with speech difficulty and inability to move his right side. He was last seen normal at 9:30 PM on 04/30/2013. CT scan of his head showed an 8 cm left parietal hemorrhage with extension into left lateral ventricle. There was mild mass effect as well as mild vasogenic edema. Blood pressure was markedly elevated at 247/132. Cardene drip was started for blood pressure management. NIH stroke score was 22. Patient was not a TPA candidate secondary to hemorrhage. He was admitted to the neuro ICU for further evaluation and treatment.  SUBJECTIVE Patient sitting up in the bed; no family at bedside.   OBJECTIVE Most recent Vital Signs: Filed Vitals:   05/05/13 0235 05/05/13 0320 05/05/13 0548 05/05/13 0650  BP: 191/116 168/98 186/89 150/88  Pulse: 88 82 80 79  Temp: 97.3 F (36.3 C)  99.3 F (37.4 C)   TempSrc: Oral  Oral   Resp: 22  24   Height:      Weight:      SpO2: 99%  99%    CBG (last 3)   Recent Labs  05/04/13 0809 05/04/13 1156 05/04/13 1554  GLUCAP 101* 127* 137*    IV Fluid Intake:   . sodium chloride 10 mL/hr at 05/04/13 1200  . feeding supplement (VITAL AF 1.2 CAL) 1,000 mL (05/04/13 0600)    MEDICATIONS  . amLODipine  10 mg Oral Daily  . antiseptic oral rinse  15 mL Mouth Rinse q12n4p  . chlorhexidine  15 mL Mouth Rinse BID  . pantoprazole (PROTONIX) IV  40 mg Intravenous QHS   PRN:  acetaminophen, acetaminophen, labetalol  Diet:  Dysphagia 1 honey thick liquids Activity:  Up with assistance DVT Prophylaxis:  SCDs  CLINICALLY SIGNIFICANT STUDIES Basic Metabolic Panel:   Recent Labs Lab 05/04/13 0346 05/05/13 0453  NA 145 141  K 3.5* 3.5*  CL 109 102  CO2 24 20  GLUCOSE 106* 100*  BUN 20 19  CREATININE 0.94 0.93  CALCIUM 8.6 9.0   Liver Function Tests:   Recent Labs Lab 05/01/13 0558  AST 18  ALT 21   ALKPHOS 75  BILITOT 0.6  PROT 6.6  ALBUMIN 3.3*   CBC:   Recent Labs Lab 05/01/13 0558  05/03/13 0358 05/04/13 0346  WBC 9.0  --  12.2* 13.4*  NEUTROABS 6.6  --   --   --   HGB 16.6  < > 14.1 14.5  HCT 46.9  < > 42.1 43.2  MCV 87.7  --  89.8 90.4  PLT 162  --  190 180  < > = values in this interval not displayed. Coagulation:   Recent Labs Lab 05/01/13 0558  LABPROT 13.4  INR 1.04   Cardiac Enzymes:   Recent Labs Lab 05/01/13 0558  TROPONINI <0.30   Urinalysis:   Recent Labs Lab 05/01/13 0559  COLORURINE YELLOW  LABSPEC 1.015  PHURINE 7.5  GLUCOSEU NEGATIVE  HGBUR SMALL*  BILIRUBINUR NEGATIVE  KETONESUR NEGATIVE  PROTEINUR >300*  UROBILINOGEN 0.2  NITRITE NEGATIVE  LEUKOCYTESUR NEGATIVE   Lipid Panel     Component Value Date/Time   CHOL 192 05/01/2013 1445   TRIG 99 05/01/2013 1445   HDL 47 05/01/2013 1445   CHOLHDL 4.1 05/01/2013 1445   VLDL 20 05/01/2013 1445   LDLCALC 125* 05/01/2013 1445   HgbA1C  Lab Results  Component Value Date  HGBA1C 5.5 05/01/2013    Urine Drug Screen:     Component Value Date/Time   LABOPIA NONE DETECTED 05/01/2013 0559   COCAINSCRNUR NONE DETECTED 05/01/2013 0559   LABBENZ NONE DETECTED 05/01/2013 0559   AMPHETMU NONE DETECTED 05/01/2013 0559   THCU NONE DETECTED 05/01/2013 0559   LABBARB NONE DETECTED 05/01/2013 0559    Alcohol Level:   Recent Labs Lab 05/01/13 0558  ETH <11    CT of the brain 05/03/2013 Overall no significant change since the prior examination with the possible exception of decreased amount of blood within the dependent aspect of the right lateral ventricle.  05/02/2013    Left hemispheric hemorrhage and associated mass effect as detailed above. When compared to prior examination, the amount of blood within the dependent aspect of the right lateral ventricle has increased.  Mild prominence of the right lateral ventricle unchanged from prior exam. Attention to this on follow up.     05/01/2013   1. Large 4.6 x 3.9 cm intraparenchymal hematoma at the left parietal lobe, adjacent to the left basal ganglia and thalamus. Extension of blood into the left lateral ventricle, with mild associated vasogenic edema. There is minimal if any midline shift at this time. Slight effacement of the left lateral ventricle, without evidence for obstruction. 2. Diffuse small vessel ischemic microangiopathy; chronic lacunar infarct within the inferior right basal ganglia. 3. Mild mucosal thickening within the maxillary sinuses.   MRI of the brain    MRA of the brain    2D Echocardiogram  EF 60-65% with no source of embolus.   Carotid Doppler  No evidence of hemodynamically significant internal carotid artery stenosis. Vertebral artery flow is antegrade.   CXR  05/02/2013 Shallow inspiration. Cardiac enlargement with suggestion of early vascular congestion. No edema or consolidation.  EKG  normal EKG, normal sinus rhythm, unchanged from previous tracings.   Therapy Recommendations CIR  GENERAL EXAM: RESTLESS. Well developed. Neck is supple  CARDIOVASCULAR: Regular rate and rhythm, no murmurs, no carotid bruits  NEUROLOGIC: MENTAL STATUS: awake, alert; SIGNIFICANT EXPRESSIVE > RECEPTIVE APHASIA. ABLE TO FOLLOW, repeats "today is a sunny day". Follows simple commands with repeated requests (open/close eyes, show thumb on left) Not following commands on R side. CRANIAL NERVE: pupils equal and reactive to light, RIGHT HOMONYMOUS HEMIANOPSIA. LEFT GAZE PREFERENCE, BUT ABLE TO TRACK PAST MIDLINE TO THE RIGHT. DECR RIGHT LOWER FACIAL STRENGTH. Hearing intact, palate elevates symmetrically, uvula midline, shoulder shrug symmetric, tongue midline. MOTOR: RUE FLACCID 0/5; RLE WITHDRAWAL TO STIM (WITH UPGOING TOE). LUE 4/5. LLE 4/5.  SENSORY: DECR ON RIGHT SIDE COORDINATION: UNABLE IN RIGHT SIDE DUE TO WEAKNESS. LIMITED COMPREHENSION FOR TESTING LEFT SIDE. REFLEXES: deep tendon reflexes TRACE and  symmetric GAIT/STATION: BEDREST   ASSESSMENT [name] is a 63 y.o. male presenting with new onset expressive aphasia and right-sided weakness. Imaging confirms a  large left parietal parenchymal hemorrhage with intraventricular extension and cerebral edema. Hemorrhage felt to be secondary to malignant hypertension with BP 208/122 on admission.  On no antithrombotics prior to admission. Patient with resultant global aphasia, dysphagia and right hemiplegia, continues to be more awake with improvement. Work up completed.   Respiratory distress 12/29, CCM following, trial bipap during the past 2 nights, they think he likely had OSA prior to admission   Malignant hypertension, not on antihypertensives at home, on cardene drip at 7mg . norvasc added once tube placed for feeding 12/20. Off cardene this am. Hyperlipidemia, LDL 125, on no statin PTA,  now on no statin, goal LDL < 100 (< 70 for diabetics) Renal insufficiency, Cr 1.3, borderline UOP leukocytosis  Hypokalemia, K 3.5,  Dysphagia, failed swallow eval, flexible feeding tube placed and tube feedings started. Has now passed swallow, diet started and tube removed. Chews tobacco  Hospital day # 4  TREATMENT/PLAN  Add HCTZ 12.5 mg daily  Replace K - put on scheduled dose  CBC, BMET in am  Safe to add LMWH for VTE prophy, will d/c SCD  Transfer to rehab once bed available  add statin at discharge    Annie MainSHARON BIBY, MSN, RN, ANVP-BC, ANP-BC, GNP-BC [facility] Pager: [phone] 05/05/2013 9:32 AM  I have personally obtained a history, examined the patient, evaluated imaging results, and formulated the assessment and plan of care.  Elspeth ChoPeter Fae Blossom, DO Neurology-Stroke

## 2013-05-05 NOTE — Significant Event (Signed)
Rapid Response Event Note  Overview: Time Called: 1849 Arrival Time: 1851 Event Type: Other (Comment)  Initial Focused Assessment:  Called by RN for patient diaphoretic and labored breathing.  Upon arrival to patients room, Rn at bedside.  Patient has elevated BP today and was given 20 mg hydralizine about 40 minute ago and patient developed labored breathing and diaphorteic and lethargy.  Patient skin hot, Temp rectal 101. BP 170's/90, SPo2 98% on 2 lpm nasal cannula.     Interventions:  Recommended RN to call MD, ??aspiration, recommend BC, PCXR.  Rn to call if assitance needed    Event Summary:   at      at          Dot LanesWolfe, Kyna Blahnik Ann

## 2013-05-05 NOTE — Progress Notes (Signed)
Physical Therapy Treatment Patient Details Name: [name] MRN: [id number] DOB: 05-11-1950 Today's Date: 05/05/2013 Time: [id number] PT Time Calculation (min): 26 min  PT Assessment / Plan / Recommendation  History of Present Illness 63 y.o. male history of hypertension who was found on the floor of his home early this morning with speech difficulty and unable to move his right side. He was last seen normal at 9:30 PM on 04/30/2013. CT scan of his head showed an 8 cm left parietal hemorrhage with extension into left lateral ventricle. There was mild mass effect as well as mild vasogenic edema. Blood pressure was markedly elevated at 247/132. Cardene drip was started for blood pressure management. NIH stroke score was 22   PT Comments   Patient continues to be heavy pusher to R side. ABle to transfer to recliner with +2 A. Rn made aware to use lift to get back into bed. Continue to recommend comprehensive inpatient rehab (CIR) for post-acute therapy needs.   Follow Up Recommendations  CIR     Does the patient have the potential to tolerate intense rehabilitation     Barriers to Discharge        Equipment Recommendations       Recommendations for Other Services    Frequency Min 4X/week   Progress towards PT Goals Progress towards PT goals: Progressing toward goals  Plan Current plan remains appropriate    Precautions / Restrictions Precautions Precautions: Fall Precaution Comments: R sided inattention, R pusher   Pertinent Vitals/Pain no apparent distress     Mobility  Bed Mobility Supine to Sit: 1: +2 Total assist;HOB flat Supine to Sit: Patient Percentage: 50% Transfers Transfers: Sit to Stand;Stand to Sit Sit to Stand: 1: +2 Total assist;With upper extremity assist;From bed Sit to Stand: Patient Percentage: 50% Stand to Sit: 1: +2 Total assist;With upper extremity assist;To bed Stand to Sit: Patient Percentage: 50% Details for Transfer Assistance:  L LE requiring max  R knee blocking to prevent buckling. A for all aspects of stand pivot transfer. Transferred to R side as patient pushes towards right.  Ambulation/Gait Ambulation/Gait Assistance: Not tested (comment)    Exercises     PT Diagnosis:    PT Problem List:   PT Treatment Interventions:     PT Goals (current goals can now be found in the care plan section)    Visit Information  Last PT Received On: 05/05/13 Assistance Needed: +2 History of Present Illness: 63 y.o. male history of hypertension who was found on the floor of his home early this morning with speech difficulty and unable to move his right side. He was last seen normal at 9:30 PM on 04/30/2013. CT scan of his head showed an 8 cm left parietal hemorrhage with extension into left lateral ventricle. There was mild mass effect as well as mild vasogenic edema. Blood pressure was markedly elevated at 247/132. Cardene drip was started for blood pressure management. NIH stroke score was 22    Subjective Data      Cognition  Cognition Arousal/Alertness: Awake/alert Behavior During Therapy: WFL for tasks assessed/performed Overall Cognitive Status: Impaired/Different from baseline Area of Impairment: Safety/judgement;Awareness;Problem solving Current Attention Level: Sustained Following Commands: Follows one step commands consistently Safety/Judgement: Decreased awareness of safety;Decreased awareness of deficits Problem Solving: Requires verbal cues;Requires tactile cues    Balance  Static Sitting Balance Static Sitting - Balance Support: Left upper extremity supported;Feet supported Static Sitting - Level of Assistance: 1: +2 Total assist Static Sitting - Comment/#  of Minutes: Sat EOB ~10 minutes. Patient with very heavy pushing when attempting midline posture  End of Session PT - End of Session Equipment Utilized During Treatment: Gait belt Activity Tolerance: Patient limited by fatigue Patient left: in chair;with call  bell/phone within reach Nurse Communication: Mobility status;Need for lift equipment   GP     Devanny Palecek, Adline Potter 05/05/2013, 12:25 PM 05/05/2013 Fredrich Birks PTA [phone] pager [phone] office

## 2013-05-05 NOTE — Progress Notes (Signed)
Speech Language Pathology Treatment: Dysphagia;Cognitive-Linquistic  Patient Details Name: [name] MRN: [id number] DOB: Jan 01, 1951 Today's Date: 05/05/2013 Time: 1200-1230 SLP Time Calculation (min): 30 min  Assessment / Plan / Recommendation Clinical Impression  F/u for diet tolerance of dysphagia 1 diet consistency and honey thick liquids as recommended from MBS completed on 05/04/13.  Observed directly with puree consistency with set up and verbal and tactile cues required to initiate self feeding.  Oral care completed s/p PO's with minimal amount of residue cleared from right anterior sulci.  Skilled education provided to nursing and caregivers focusing on swallow strategies.   Recommend  full supervision with all meals secondary to patient's decreased safety awareness with inability to complete swallow strategies w/o assist.  Treatment also focused on receptive language skills and cognition.  Moderate to max multi-model cueing required for patient to successfully complete target 2 step basic ADL while maintaining attention to initiate to completion and to attend to right.  Recommend to continue current diet consistency of dysphagia 1 and honey thick liquids with full supervision with all meals.  ST to continue in acute care setting for diet tolerance and to address communication and cognitive deficits.     HPI HPI: 63 y.o. male history of hypertension who was found on the floor of his home early this morning 05/01/2013 with speech difficulty and inability to move his right side. He was last seen normal at 9:30 PM on 04/30/2013. CT scan of his head showed an 8 cm left parietal hemorrhage with extension into left lateral ventricle. There was mild mass effect as well as mild vasogenic edema. Blood pressure was markedly elevated at 247/132. Cardene drip was started for blood pressure management. NIH stroke score was 22      SLP Plan  Continue with current plan of care    Recommendations Diet  recommendations: Dysphagia 1 (puree);Honey-thick liquid Liquids provided via: Teaspoon;Cup Medication Administration: Crushed with puree Supervision: Full supervision/cueing for compensatory strategies;Staff to assist with self feeding Compensations: Slow rate;Small sips/bites;Check for pocketing;Check for anterior loss Postural Changes and/or Swallow Maneuvers: Seated upright 90 degrees;Out of bed for meals;Upright 30-60 min after meal              General recommendations: Rehab consult Oral Care Recommendations: Oral care Q4 per protocol Follow up Recommendations: Inpatient Rehab Plan: Continue with current plan of care    GO    [name] FowlerKaren [name] Ewing MS, CCC-SLP [phone] [facility] NeDANKOF,[name] 05/05/2013, 1:04 PM

## 2013-05-05 NOTE — Progress Notes (Signed)
At 1840 RN was called into pt.'s room.  His BP was 196/97, HR 80, RR 24 and labored.  Pt. Was diaphoretic, had temp of 99.1 orally and 101.1 rectally/.  Dr. Hosie PoissonSumner was paged.  RRT was called after no returned called from MD.  RRT RN came to the patient.  Suggested to page Dr. Thad Rangereynolds.  She stated possible aspiration.  Dr. Thad Rangereynolds returned paged and ordered 10mg  of Labetolol and CXR. Also, to reach out to CCM since they are monitoring patient for labored breathing.  Will continue to monitor patient.  Call light placed within reach.

## 2013-05-06 ENCOUNTER — Inpatient Hospital Stay (HOSPITAL_COMMUNITY)
Admission: RE | Admit: 2013-05-06 | Discharge: 2013-06-06 | DRG: 945 | Disposition: A | Discharge status: Skilled Nursing Facility | Payer: Medicare Other | Source: Intra-hospital | Attending: Physical Medicine & Rehabilitation | Admitting: Physical Medicine & Rehabilitation

## 2013-05-06 DIAGNOSIS — M1710 Unilateral primary osteoarthritis, unspecified knee: Secondary | ICD-10-CM | POA: Diagnosis not present

## 2013-05-06 DIAGNOSIS — I619 Nontraumatic intracerebral hemorrhage, unspecified: Secondary | ICD-10-CM | POA: Diagnosis not present

## 2013-05-06 DIAGNOSIS — K219 Gastro-esophageal reflux disease without esophagitis: Secondary | ICD-10-CM | POA: Diagnosis not present

## 2013-05-06 DIAGNOSIS — R1310 Dysphagia, unspecified: Secondary | ICD-10-CM | POA: Diagnosis present

## 2013-05-06 DIAGNOSIS — N12 Tubulo-interstitial nephritis, not specified as acute or chronic: Secondary | ICD-10-CM | POA: Diagnosis not present

## 2013-05-06 DIAGNOSIS — Z5189 Encounter for other specified aftercare: Principal | ICD-10-CM

## 2013-05-06 DIAGNOSIS — G8190 Hemiplegia, unspecified affecting unspecified side: Secondary | ICD-10-CM | POA: Diagnosis present

## 2013-05-06 DIAGNOSIS — M79609 Pain in unspecified limb: Secondary | ICD-10-CM | POA: Diagnosis not present

## 2013-05-06 DIAGNOSIS — M25469 Effusion, unspecified knee: Secondary | ICD-10-CM | POA: Diagnosis present

## 2013-05-06 DIAGNOSIS — R4701 Aphasia: Secondary | ICD-10-CM | POA: Diagnosis present

## 2013-05-06 DIAGNOSIS — I059 Rheumatic mitral valve disease, unspecified: Secondary | ICD-10-CM | POA: Diagnosis present

## 2013-05-06 DIAGNOSIS — R000 Tachycardia, unspecified: Secondary | ICD-10-CM | POA: Diagnosis present

## 2013-05-06 DIAGNOSIS — G8100 Flaccid hemiplegia affecting unspecified side: Secondary | ICD-10-CM | POA: Diagnosis present

## 2013-05-06 DIAGNOSIS — S99919A Unspecified injury of unspecified ankle, initial encounter: Secondary | ICD-10-CM | POA: Diagnosis not present

## 2013-05-06 DIAGNOSIS — R259 Unspecified abnormal involuntary movements: Secondary | ICD-10-CM | POA: Diagnosis not present

## 2013-05-06 DIAGNOSIS — G936 Cerebral edema: Secondary | ICD-10-CM | POA: Diagnosis present

## 2013-05-06 DIAGNOSIS — N10 Acute tubulo-interstitial nephritis: Secondary | ICD-10-CM | POA: Diagnosis not present

## 2013-05-06 DIAGNOSIS — IMO0002 Reserved for concepts with insufficient information to code with codable children: Secondary | ICD-10-CM | POA: Diagnosis not present

## 2013-05-06 DIAGNOSIS — E785 Hyperlipidemia, unspecified: Secondary | ICD-10-CM | POA: Diagnosis present

## 2013-05-06 DIAGNOSIS — R7881 Bacteremia: Secondary | ICD-10-CM | POA: Diagnosis not present

## 2013-05-06 DIAGNOSIS — I824Z9 Acute embolism and thrombosis of unspecified deep veins of unspecified distal lower extremity: Secondary | ICD-10-CM | POA: Diagnosis not present

## 2013-05-06 DIAGNOSIS — Z720 Tobacco use: Secondary | ICD-10-CM | POA: Diagnosis present

## 2013-05-06 DIAGNOSIS — G459 Transient cerebral ischemic attack, unspecified: Secondary | ICD-10-CM | POA: Diagnosis not present

## 2013-05-06 DIAGNOSIS — I10 Essential (primary) hypertension: Secondary | ICD-10-CM | POA: Diagnosis not present

## 2013-05-06 DIAGNOSIS — G4733 Obstructive sleep apnea (adult) (pediatric): Secondary | ICD-10-CM | POA: Diagnosis present

## 2013-05-06 DIAGNOSIS — R509 Fever, unspecified: Secondary | ICD-10-CM | POA: Diagnosis not present

## 2013-05-06 DIAGNOSIS — R1314 Dysphagia, pharyngoesophageal phase: Secondary | ICD-10-CM

## 2013-05-06 DIAGNOSIS — F17200 Nicotine dependence, unspecified, uncomplicated: Secondary | ICD-10-CM | POA: Diagnosis present

## 2013-05-06 DIAGNOSIS — M109 Gout, unspecified: Secondary | ICD-10-CM | POA: Diagnosis present

## 2013-05-06 DIAGNOSIS — M25579 Pain in unspecified ankle and joints of unspecified foot: Secondary | ICD-10-CM | POA: Diagnosis not present

## 2013-05-06 DIAGNOSIS — Z9989 Dependence on other enabling machines and devices: Secondary | ICD-10-CM

## 2013-05-06 DIAGNOSIS — Z86718 Personal history of other venous thrombosis and embolism: Secondary | ICD-10-CM | POA: Diagnosis not present

## 2013-05-06 DIAGNOSIS — I119 Hypertensive heart disease without heart failure: Secondary | ICD-10-CM | POA: Diagnosis not present

## 2013-05-06 DIAGNOSIS — S8990XA Unspecified injury of unspecified lower leg, initial encounter: Secondary | ICD-10-CM | POA: Diagnosis not present

## 2013-05-06 DIAGNOSIS — F411 Generalized anxiety disorder: Secondary | ICD-10-CM | POA: Diagnosis present

## 2013-05-06 DIAGNOSIS — R0603 Acute respiratory distress: Secondary | ICD-10-CM

## 2013-05-06 DIAGNOSIS — N390 Urinary tract infection, site not specified: Secondary | ICD-10-CM | POA: Diagnosis not present

## 2013-05-06 DIAGNOSIS — E876 Hypokalemia: Secondary | ICD-10-CM | POA: Diagnosis present

## 2013-05-06 DIAGNOSIS — G8110 Spastic hemiplegia affecting unspecified side: Secondary | ICD-10-CM | POA: Diagnosis not present

## 2013-05-06 DIAGNOSIS — I69920 Aphasia following unspecified cerebrovascular disease: Secondary | ICD-10-CM | POA: Diagnosis not present

## 2013-05-06 DIAGNOSIS — I629 Nontraumatic intracranial hemorrhage, unspecified: Secondary | ICD-10-CM | POA: Diagnosis present

## 2013-05-06 LAB — CBC
HCT: 44.1 % (ref 39.0–52.0)
HCT: 44.6 % (ref 39.0–52.0)
Hemoglobin: 15.5 g/dL (ref 13.0–17.0)
Hemoglobin: 14.8 g/dL (ref 13.0–17.0)
MCH: 31.1 pg (ref 26.0–34.0)
MCH: 29 pg (ref 26.0–34.0)
MCHC: 35.1 g/dL (ref 30.0–36.0)
MCHC: 33.2 g/dL (ref 30.0–36.0)
MCV: 88.6 fL (ref 78.0–100.0)
MCV: 87.3 fL (ref 78.0–100.0)
PLATELETS: 169 10*3/uL (ref 150–400)
PLATELETS: 199 10*3/uL (ref 150–400)
RBC: 4.98 MIL/uL (ref 4.22–5.81)
RBC: 5.11 MIL/uL (ref 4.22–5.81)
RDW: 12.8 % (ref 11.5–15.5)
RDW: 12.6 % (ref 11.5–15.5)
WBC: 10.9 10*3/uL — AB (ref 4.0–10.5)
WBC: 9.4 10*3/uL (ref 4.0–10.5)

## 2013-05-06 LAB — CREATININE, SERUM
Creatinine, Ser: 1.05 mg/dL (ref 0.50–1.35)
GFR, EST AFRICAN AMERICAN: 86 mL/min — AB (ref >90)
GFR, EST NON AFRICAN AMERICAN: 74 mL/min — AB (ref >90)

## 2013-05-06 LAB — BASIC METABOLIC PANEL
BUN: 25 mg/dL — ABNORMAL HIGH (ref 6–23)
CALCIUM: 9.4 mg/dL (ref 8.4–10.5)
CO2: 24 mEq/L (ref 19–32)
Chloride: 104 mEq/L (ref 96–112)
Creatinine, Ser: 0.99 mg/dL (ref 0.50–1.35)
GFR calc Af Amer: >90 mL/min (ref >90)
GFR, EST NON AFRICAN AMERICAN: 86 mL/min — AB (ref >90)
Glucose, Bld: 110 mg/dL — ABNORMAL HIGH (ref 70–99)
Potassium: 3.9 mEq/L (ref 3.7–5.3)
SODIUM: 142 meq/L (ref 137–147)

## 2013-05-06 MED ORDER — ONDANSETRON HCL 4 MG PO TABS: 4.0000 mg | ORAL_TABLET | Freq: Four times a day (QID) | ORAL | Status: DC | PRN

## 2013-05-06 MED ORDER — LABETALOL HCL 100 MG PO TABS
100.0000 mg | ORAL_TABLET | Freq: Two times a day (BID) | ORAL | Status: DC
  Administered 2013-05-06 – 2013-05-08 (×4): 100 mg via ORAL
  Filled 2013-05-06 (×6): qty 1

## 2013-05-06 MED ORDER — BIOTENE DRY MOUTH MT LIQD
15.0000 mL | Freq: Two times a day (BID) | OROMUCOSAL | Status: DC
  Administered 2013-05-07 – 2013-06-05 (×51): 15 mL via OROMUCOSAL

## 2013-05-06 MED ORDER — ACETAMINOPHEN 325 MG PO TABS: 325.0000 mg | ORAL_TABLET | ORAL | Status: DC | PRN

## 2013-05-06 MED ORDER — SORBITOL 70 % SOLN
30.0000 mL | Freq: Every day | Status: DC | PRN
  Administered 2013-05-26: 30 mL via ORAL
  Filled 2013-05-06 (×2): qty 30

## 2013-05-06 MED ORDER — HYDROCHLOROTHIAZIDE 25 MG PO TABS
25.0000 mg | ORAL_TABLET | Freq: Every day | ORAL | Status: DC
  Administered 2013-05-07 – 2013-05-31 (×25): 25 mg via ORAL
  Filled 2013-05-06 (×28): qty 1

## 2013-05-06 MED ORDER — ACETAMINOPHEN 650 MG RE SUPP: 650.0000 mg | RECTAL | Status: DC | PRN

## 2013-05-06 MED ORDER — CHLORHEXIDINE GLUCONATE 0.12 % MT SOLN
15.0000 mL | Freq: Two times a day (BID) | OROMUCOSAL | Status: DC
  Administered 2013-05-06 – 2013-06-05 (×60): 15 mL via OROMUCOSAL
  Filled 2013-05-06 (×64): qty 15

## 2013-05-06 MED ORDER — ENOXAPARIN SODIUM 40 MG/0.4ML ~~LOC~~ SOLN
40.0000 mg | SUBCUTANEOUS | Status: DC
  Administered 2013-05-07 – 2013-05-24 (×18): 40 mg via SUBCUTANEOUS
  Filled 2013-05-06 (×18): qty 0.4

## 2013-05-06 MED ORDER — ONDANSETRON HCL 4 MG/2ML IJ SOLN
4.0000 mg | Freq: Four times a day (QID) | INTRAMUSCULAR | Status: DC | PRN
  Administered 2013-06-05: 4 mg via INTRAVENOUS
  Filled 2013-05-06: qty 2

## 2013-05-06 MED ORDER — CLONIDINE HCL 0.1 MG PO TABS
0.1000 mg | ORAL_TABLET | ORAL | Status: AC
  Administered 2013-05-06: 0.1 mg via ORAL
  Filled 2013-05-06: qty 1

## 2013-05-06 MED ORDER — SIMVASTATIN 20 MG PO TABS
20.0000 mg | ORAL_TABLET | Freq: Every day | ORAL | Status: DC
  Filled 2013-05-06: qty 1

## 2013-05-06 MED ORDER — ACETAMINOPHEN 325 MG PO TABS
650.0000 mg | ORAL_TABLET | ORAL | Status: DC | PRN
  Administered 2013-05-08 – 2013-06-03 (×10): 650 mg via ORAL
  Filled 2013-05-06 (×12): qty 2

## 2013-05-06 MED ORDER — PANTOPRAZOLE SODIUM 40 MG PO PACK
40.0000 mg | PACK | Freq: Every day | ORAL | Status: DC
  Administered 2013-05-07 – 2013-05-26 (×20): 40 mg
  Filled 2013-05-06 (×23): qty 20

## 2013-05-06 MED ORDER — HYDROCHLOROTHIAZIDE 25 MG PO TABS: 25.0000 mg | ORAL_TABLET | Freq: Every day | ORAL | Status: DC

## 2013-05-06 MED ORDER — AMLODIPINE BESYLATE 10 MG PO TABS
10.0000 mg | ORAL_TABLET | Freq: Every day | ORAL | Status: DC
  Administered 2013-05-07 – 2013-06-06 (×31): 10 mg via ORAL
  Filled 2013-05-06 (×34): qty 1

## 2013-05-06 MED ORDER — ENOXAPARIN SODIUM 40 MG/0.4ML ~~LOC~~ SOLN: 40.0000 mg | SUBCUTANEOUS | Status: DC

## 2013-05-06 NOTE — PMR Pre-admission (Signed)
PMR Admission Coordinator Pre-Admission Assessment  Patient: [name] is an 63 y.o., male MRN: [id number] DOB: 1950/08/23 Height: 5\' 10"  (177.8 cm) Weight: 88.2 kg (194 lb 7.1 oz)              Insurance Information HMO:      PPO:       PCP:       IPA:       80/20:       OTHER:   PRIMARY: Medicare A/B      Policy#: [id number]      Subscriber: Magda Bernheim CM Name:        Phone#:       Fax#:   Pre-Cert#:        Employer: Worked on [employer].  Is disabled Benefits:  Phone #:       Name: [place] [employer] Date:  02/02/09     Deduct: $1260      Out of Pocket Max: none      Life Max: unlimited CIR: 100%      SNF: 100 days Outpatient: 80%     Co-Pay: 20% Home Health: 100%      Co-Pay: none DME: 80%     Co-Pay: 20% Providers: patient's choice  SECONDARY: Medicaid of [place]      Policy#: [id number]      Subscriber: Magda Bernheim CM Name:        Phone#:       Fax#:   Pre-Cert#:        Employer: Disabled Benefits:  Phone #: [phone]     Name:   Eff. Date:       Deduct:        Out of Pocket Max:        Life Max:   CIR:        SNF:   Outpatient:       Co-Pay:   Home Health:        Co-Pay:   DME:       Co-Pay:    Emergency Contact Information Contact Information   Name Relation Home Work Mobile   Ragland,[name] Significant other [phone]  [phone]   [place] [phone]  [phone]     Current Medical History  Patient Admitting Diagnosis: Left parietal/BG ICH    History of Present Illness: A 63 y.o. male with history of HTN--no medication; who was found down on floor with inability to speak or move right side. 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 BP elevated at 247/132 and patient started on cardene drip for control--NIH stroke score-22. 2 D echo  with EF 60-65%, mild MR, mod LA dilation. Carotid dopplers without significant ICA stenosis. Patient with resultant dysarthria, expressive>receptive aphsia, left gaze preference, right hemiparesis with sensory deficits. Therapies initiated yesterday-diet recently advanced to dysphagia 1 honey liquids .latest cranial CT scan stable and Lovenox has been initiated for DVT prophylaxis as of 05/05/2013. Patient with acute respiratory distress 12/29 pm requiring BIPAP/CPAP suspect untreated obstructive sleep apnea and follow pulmonary services. MD, PT, ST recommending CIR. patient was felt to be a good candidate for inpatient rehabilitation services and will be admitted for comprehensive rehabilitation program.    Total: 20=NIH  Past Medical History  Past Medical History  Diagnosis Date  . Hypertension  Family History  family history is not on file.  Prior Rehab/Hospitalizations:  None   Current Medications  Current facility-administered medications:0.9 %  sodium chloride infusion, , Intravenous, Continuous, Oretha Milchakesh V Alva, MD, Last Rate: 10 mL/hr at 05/04/13 1200;  acetaminophen (TYLENOL) suppository 650 mg, 650 mg, Rectal, Q4H PRN, Noel Christmasharles Stewart, 650 mg at 05/03/13 [id number];  acetaminophen (TYLENOL) tablet 650 mg, 650 mg, Oral, Q4H PRN, Noel Christmasharles Stewart;  amLODipine (NORVASC) tablet 10 mg, 10 mg, Oral, Daily, Layne BentonSharon L Biby, NP, 10 mg at 05/06/13 1015 antiseptic oral rinse (BIOTENE) solution 15 mL, 15 mL, Mouth Rinse, q12n4p, Suanne MarkerVikram R Penumalli, MD, 15 mL at 05/05/13 1600;  chlorhexidine (PERIDEX) 0.12 % solution 15 mL, 15 mL, Mouth Rinse, BID, Suanne MarkerVikram R Penumalli, MD, 15 mL at 05/06/13 1022;  enoxaparin (LOVENOX) injection 40 mg, 40 mg, Subcutaneous, Q24H, Layne BentonSharon L Biby, NP, 40 mg at 05/06/13 1015 [START ON 05/07/2013] hydrochlorothiazide (HYDRODIURIL) tablet 25 mg, 25 mg, Oral, Daily, Layne BentonSharon L Biby, NP;  labetalol (NORMODYNE,TRANDATE) injection 10-20 mg, 10-20 mg, Intravenous, Q2H PRN, Layne BentonSharon L Biby, NP,  20 mg at 05/06/13 1138;  pantoprazole sodium (PROTONIX) 40 mg/20 mL oral suspension 40 mg, 40 mg, Per Tube, Daily, Omelia BlackwaterPeter Justin Sumner, DO, 40 mg at 05/06/13 1015 potassium chloride SA (K-DUR,KLOR-CON) CR tablet 20 mEq, 20 mEq, Oral, BID, Layne BentonSharon L Biby, NP, 20 mEq at 05/06/13 1015  Patients Current Diet: Dysphagia  Precautions / Restrictions Precautions Precautions: Fall Precaution Comments: R sided inattention, R pusher Restrictions Weight Bearing Restrictions: No   Prior Activity Level Community (5-7x/wk): Went out daily.  Walks his dog 2-3 X a day.  Worked on [employer].  Home Assistive Devices / Equipment Home Assistive Devices/Equipment: None  Prior Functional Level Prior Function Level of Independence: Independent  Current Functional Level Cognition  Arousal/Alertness: Awake/alert Overall Cognitive Status: Impaired/Different from baseline Current Attention Level: Sustained Orientation Level: Oriented to person;Oriented to place Following Commands: Follows one step commands consistently Safety/Judgement: Decreased awareness of safety;Decreased awareness of deficits Attention: Sustained Sustained Attention: Impaired Sustained Attention Impairment: Functional basic;Verbal basic Memory: Impaired Awareness: Impaired Problem Solving: Impaired Problem Solving Impairment: Functional basic;Verbal basic Behaviors: Perseveration Safety/Judgment: Impaired    Extremity Assessment (includes Sensation/Coordination)          ADLs  Eating/Feeding: NPO Grooming: Maximal assistance Where Assessed - Grooming: Supported sitting Upper Body Bathing: Maximal assistance Where Assessed - Upper Body Bathing: Supported sitting Lower Body Bathing: Maximal assistance Where Assessed - Lower Body Bathing: Supine, head of bed up;Rolling right and/or left Upper Body Dressing: +1 Total assistance Where Assessed - Upper Body Dressing: Supported sitting Lower Body Dressing: +1 Total  assistance Where Assessed - Lower Body Dressing: Supine, head of bed up;Rolling right and/or left Toilet Transfer:  (not assessed) Transfers/Ambulation Related to ADLs: +2     Mobility  Bed Mobility: Supine to Sit;Sit to Supine Supine to Sit: 1: +2 Total assist;HOB flat Supine to Sit: Patient Percentage: 50% Sit to Supine: 1: +2 Total assist;HOB flat Sit to Supine: Patient Percentage: 50%    Transfers  Transfers: Sit to Stand;Stand to Sit Sit to Stand: 1: +2 Total assist;With upper extremity assist;From bed Sit to Stand: Patient Percentage: 50% Stand to Sit: 1: +2 Total assist;With upper extremity assist;To bed Stand to Sit: Patient Percentage: 50%    Ambulation / Gait / Stairs / Wheelchair Mobility  Ambulation/Gait Ambulation/Gait Assistance: Not tested (comment)    Posture / Balance Static Sitting Balance Static Sitting - Balance Support: Left upper extremity supported;Feet supported  Static Sitting - Level of Assistance: 1: +2 Total assist Static Sitting - Comment/# of Minutes: Sat EOB ~10 minutes. Patient with very heavy pushing when attempting midline posture    Special needs/care consideration BiPAP/CPAP Yes, on CPAP on acute, but not always wearing it.  BP elevation thought due to not wearing CPAP. CPM No Continuous Drip IV No Dialysis No       Life Vest No Oxygen Yes, 2.5L [facility], but wears it off and on in hospital Special Bed No Trach Size No Wound Vac (area) No       Skin No                            Bowel mgmt: Had BM 05/04/13 Bladder mgmt: Incontinent, using a condom catheter Diabetic mgmt No    Previous Home Environment Living Arrangements: Spouse/significant other;Children  Lives With: Spouse Available Help at Discharge: Family;Available 24 hours/day Type of Home: House Home Care Services: No  Discharge Living Setting Plans for Discharge Living Setting: Patient's home;House;Lives with (comment) (Lives with significant other, [name].) Type of Home at  Discharge: House Discharge Home Layout: One level Discharge Home Access: Stairs to enter Entrance Stairs-Number of Steps: 4 Does the patient have any problems obtaining your medications?: No  Social/Family/Support Systems Patient Roles: Other (Comment) (Signigicant other and SO's daughter.) Contact Information: Felicie Morn - daughter of SO Anticipated Caregiver: Clydie Braun and [name] Anticipated Caregiver's Contact Information: Clydie Braun - [phone]) [phone] ([phone] Ability/Limitations of Caregiver: Annamaria Boots and Clydie Braun can assist Caregiver Availability: 24/7 Discharge Plan Discussed with Primary Caregiver: Yes Is Caregiver In Agreement with Plan?: Yes Does Caregiver/Family have Issues with Lodging/Transportation while Pt is in Rehab?: No  Goals/Additional Needs Patient/Family Goal for Rehab: PT/OT/ST min/mod A Expected length of stay: 22-30 days Cultural Considerations: None Dietary Needs: Dys 1, honey thick liquids Equipment Needs: TBD Pt/Family Agrees to Admission and willing to participate: Yes Program Orientation Provided & Reviewed with Pt/Caregiver Including Roles  & Responsibilities: Yes  Decrease burden of Care through IP rehab admission: N/A  Possible need for SNF placement upon discharge: Yes, but not planned.  Significant other and her daughter hope to take patient home and provide care after rehab.  Patient Condition: This patient's medical and functional status has changed since the consult dated: 05/03/13 in which the Rehabilitation Physician determined and documented that the patient's condition is appropriate for intensive rehabilitative care in an inpatient rehabilitation facility. See "History of Present Illness" (above) for medical update. Functional changes are: Currently requiring total assist + 2 pt +50% for transfers . Patient's medical and functional status update has been discussed with the Rehabilitation physician and patient remains appropriate for inpatient rehabilitation.  Will admit to inpatient rehab today.  Preadmission Screen Completed By:  Trish Mage, 05/06/2013 2:23 PM ______________________________________________________________________   Discussed status with Dr. Wynn Banker on 05/06/12 at 1422 and received telephone approval for admission today.  Admission Coordinator:  Trish Mage, time1422/Date1/2/14

## 2013-05-06 NOTE — Progress Notes (Signed)
UR complete.  Shatoya Roets RN, MSN 

## 2013-05-06 NOTE — Progress Notes (Signed)
I spoke with Arvilla MeresGenie and Jasmine DecemberSharon, NP and was told to treat systolic blood pressure not diastolic blood pressure.  Patient will be transferred to CIR per Annie MainSharon Biby, NP.  [facility]

## 2013-05-06 NOTE — Discharge Summary (Signed)
Stroke Discharge Summary  Patient ID: [name]    l   MRN: [id number]      DOB: [birth year]  Date of Admission: 05/01/2013 Date of Discharge: 05/06/2013  Attending Physician:  Darcella CheshirePramodkumar Sethi, MD, Stroke MD  Consulting Physician(s):   Coralyn HellingVineet Sood, MD (pulmonary/intensive care ), Faith RogueZachary Swartz, MD (Physical Medicine & Rehabtilitation)  Patient's PCP:  No primary provider on file.  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   Past Medical History  Diagnosis Date  . Hypertension    History reviewed. No pertinent past surgical history.  Medications to be continued on Rehab . amLODipine  10 mg Oral Daily  . antiseptic oral rinse  15 mL Mouth Rinse q12n4p  . chlorhexidine  15 mL Mouth Rinse BID  . enoxaparin (LOVENOX) injection  40 mg Subcutaneous Q24H  . [START ON 05/07/2013] hydrochlorothiazide  25 mg Oral Daily  . pantoprazole sodium  40 mg Per Tube Daily  . potassium chloride  20 mEq Oral BID    LABORATORY STUDIES CBC    Component Value Date/Time   WBC 10.9* 05/06/2013 0440   RBC 4.98 05/06/2013 0440   HGB 15.5 05/06/2013 0440   HCT 44.1 05/06/2013 0440   PLT 169 05/06/2013 0440   MCV 88.6 05/06/2013 0440   MCH 31.1 05/06/2013 0440   MCHC 35.1 05/06/2013 0440   RDW 12.8 05/06/2013 0440   LYMPHSABS 1.3 05/01/2013 0558   MONOABS 0.9 05/01/2013 0558   EOSABS 0.2 05/01/2013 0558   BASOSABS 0.0 05/01/2013 0558   CMP    Component Value Date/Time   NA 142 05/06/2013 0440   K 3.9 05/06/2013 0440   CL 104 05/06/2013 0440   CO2 24 05/06/2013 0440   GLUCOSE 110* 05/06/2013 0440   BUN 25* 05/06/2013 0440   CREATININE 0.99 05/06/2013 0440   CALCIUM 9.4 05/06/2013  0440   PROT 6.6 05/01/2013 0558   ALBUMIN 3.3* 05/01/2013 0558   AST 18 05/01/2013 0558   ALT 21 05/01/2013 0558   ALKPHOS 75 05/01/2013 0558   BILITOT 0.6 05/01/2013 0558   GFRNONAA 86* 05/06/2013 0440   GFRAA >90 05/06/2013 0440   COAGS Lab Results  Component Value Date   INR 1.04 05/01/2013   Lipid Panel    Component Value Date/Time   CHOL 192 05/01/2013 1445   TRIG 99 05/01/2013 1445   HDL 47 05/01/2013 1445   CHOLHDL 4.1 05/01/2013 1445   VLDL 20 05/01/2013 1445   LDLCALC 125* 05/01/2013 1445   HgbA1C  Lab Results  Component Value Date   HGBA1C 5.5 05/01/2013   Cardiac Panel (last 3 results) No results found for this basename: CKTOTAL, CKMB, TROPONINI, RELINDX,  in the last 72 hours Urinalysis    Component Value Date/Time   COLORURINE YELLOW 05/01/2013 0559   APPEARANCEUR CLEAR 05/01/2013 0559   LABSPEC 1.015 05/01/2013 0559   PHURINE 7.5 05/01/2013 0559   GLUCOSEU NEGATIVE 05/01/2013 0559   HGBUR SMALL* 05/01/2013 0559   BILIRUBINUR NEGATIVE 05/01/2013 0559   KETONESUR NEGATIVE 05/01/2013 0559   PROTEINUR >300* 05/01/2013 0559   UROBILINOGEN 0.2 05/01/2013 0559   NITRITE NEGATIVE 05/01/2013 0559   LEUKOCYTESUR NEGATIVE 05/01/2013  0559   Urine Drug Screen     Component Value Date/Time   LABOPIA NONE DETECTED 05/01/2013 0559   COCAINSCRNUR NONE DETECTED 05/01/2013 0559   LABBENZ NONE DETECTED 05/01/2013 0559   AMPHETMU NONE DETECTED 05/01/2013 0559   THCU NONE DETECTED 05/01/2013 0559   LABBARB NONE DETECTED 05/01/2013 0559    Alcohol Level    Component Value Date/Time   ETH <11 05/01/2013 0558    SIGNIFICANT DIAGNOSTIC STUDIES CT Head 05/03/2013 Overall no significant change since the prior examination with the possible exception of decreased amount of blood within the dependent aspect of the right lateral ventricle.    05/02/2013 Left hemispheric hemorrhage and associated mass effect as detailed above. When compared to prior examination, the amount  of blood within the dependent aspect of the right lateral ventricle has increased. Mild prominence of the right lateral ventricle unchanged from prior exam. Attention to this on follow up.    05/01/2013 1. Large 4.6 x 3.9 cm intraparenchymal hematoma at the left parietal lobe, adjacent to the left basal ganglia and thalamus. Extension of blood into the left lateral ventricle, with mild associated vasogenic edema. There is minimal if any midline shift at this time. Slight effacement of the left lateral ventricle, without evidence for obstruction. 2. Diffuse small vessel ischemic microangiopathy; chronic lacunar infarct within the inferior right basal ganglia. 3. Mild mucosal thickening within the maxillary sinuses.  2D Echocardiogram EF 60-65% with no source of embolus.  Carotid Doppler No evidence of hemodynamically significant internal carotid artery stenosis. Vertebral artery flow is antegrade.  CXR   05/05/2013 Increased mild diffuse interstitial infiltrates, suspicious for interstitial edema. Stable cardiomegaly.   05/02/2013 Shallow inspiration. Cardiac enlargement with suggestion of early vascular congestion. No edema or consolidation.  EKG normal EKG, normal sinus rhythm, unchanged from previous tracings.      History of Present Illness   63 y.o. male history of hypertension who was found on the floor of his home early this morning 05/01/2013 with speech difficulty and inability to move his right side. He was last seen normal at 9:30 PM on 04/30/2013. CT scan of his head showed an 8 cm left parietal hemorrhage with extension into left lateral ventricle. There was mild mass effect as well as mild vasogenic edema. Blood pressure was markedly elevated at 247/132. Cardene drip was started for blood pressure management. NIH stroke score was 22. Patient was not a TPA candidate secondary to hemorrhage. He was admitted to the neuro ICU for further evaluation and treatment.  Hospital Course Imaging confirms a  large left parietal parenchymal hemorrhage with intraventricular extension and cerebral edema. Hemorrhage felt to be secondary to malignant hypertension with BP 208/122 on admission. Malignant hypertension, not on antihypertensives at home, was placed on cardene drip in hospital. Was started on norvasc and HCTZ in hospital once tube placed for feeding. He was weaned off cardene, though BP remains periodcially elevated. Elevations seem to correspond to patient's refusal to wear CPAP. Has received IV labetolol more consistenly at night that during the day. On day of discharge, HCTZ was increased and SBP goal increased to <185 as pt 5 days out from hemorrhage.  Patient was on no antithrombotics prior to admission.   Patient with vascular risk factors of:  hypertension,  Hyperlipidemia, LDL 125, on no statin PTA, now on no statin, goal LDL < 100. Placed on zocor at time of discharge Respiratory distress 12/29 and again 05/06/2012. CCM feels he has undiagnosed obstructive sleep apnea, followed by  CCM in hospital. Placed on CPAP at night, which pt has been refusing to use. Chews tobacco  Patient also with: Possible aspiration but currently afebrile, leukocytosis improving. CXR with tenuous changes Renal insufficiency, Cr 1.3, borderline UOP in ICU has improved Hypokalemia, replaced, now on standing dose  Dysphagia, secondary to stroke, failed swallow eval, flexible feeding tube placed and tube feedings started. They following day he passed swallow, D1 honey thick liquid diet started and tube removed. At high risk for aspiration.  Patient with resultant global aphasia, dysphagia and right hemiplegia. Physical therapy, occupational therapy and speech therapy evaluated patient. All agreed inpatient rehab is needed. Patient's family are supportive and can provide care at discharge. CIR bed is available today and patient will be transferred there.  Discharge Exam  Blood pressure 166/92, pulse 79, temperature 98.6  F (37 C), temperature source Oral, resp. rate 20, height 5\' 10"  (1.778 m), weight 88.2 kg (194 lb 7.1 oz), SpO2 96.00%.  GENERAL EXAM:  RESTLESS. Well developed. Neck is supple  CARDIOVASCULAR:  Regular rate and rhythm, no murmurs, no carotid bruits  NEUROLOGIC:  MENTAL STATUS: awake, alert; SIGNIFICANT EXPRESSIVE > RECEPTIVE APHASIA. ABLE TO FOLLOW, repeats "today is a sunny day". Follows simple commands with repeated requests (open/close eyes, show thumb on left) Not following commands on R side.  CRANIAL NERVE: pupils equal and reactive to light, RIGHT HOMONYMOUS HEMIANOPSIA. LEFT GAZE PREFERENCE, BUT ABLE TO TRACK PAST MIDLINE TO THE RIGHT. DECR RIGHT LOWER FACIAL STRENGTH. Hearing intact, palate elevates symmetrically, uvula midline, shoulder shrug symmetric, tongue midline.  MOTOR: RUE FLACCID 0/5; RLE WITHDRAWAL TO STIM (WITH UPGOING TOE). LUE 4/5. LLE 4/5.  SENSORY: DECR ON RIGHT SIDE  COORDINATION: UNABLE IN RIGHT SIDE DUE TO WEAKNESS. LIMITED COMPREHENSION FOR TESTING LEFT SIDE.  REFLEXES: deep tendon reflexes TRACE and symmetric  GAIT/STATION: BEDREST   Discharge Diet  Dysphagia 1 honey thick liquids  Discharge Plan  Disposition:  Transfer to [facility] for ongoing PT, OT and ST  Monitor K   CPAP at night, OP sleep evaluation once stable post rehab discharge. Dr. Pearlean Brownie can arrange at time of followup.  Recommend ongoing risk factor control by Primary Care Physician at time of discharge from inpatient rehabilitation.  Follow-up primary provider in 1 month following discharge from rehab, get one if you do not have.  Follow-up with Dr. Delia Heady, Stroke Clinic in 2 months.  45 minutes were spent preparing discharge.  Signed Annie Main, AVNP, ANP-BC, [facility] Nurse Practitioner 05/06/2013, 2:38 PM  I have personally examined this patient, reviewed pertinent data and developed the plan of care. I agree with above.

## 2013-05-06 NOTE — Progress Notes (Signed)
Paged MD, pt.'s BP is 179/115.  Requesting order to go ahead and administer Labetolol even if not within parameters.  Per Dr. Leroy Kennedyamilo to go ahead and administer 20mg  of Labetolol to pt. Will continue to monitor patient.  Call light placed within reach.

## 2013-05-06 NOTE — Progress Notes (Signed)
Stroke Team Progress Note  HISTORY 63 y.o. male history of hypertension who was found on the floor of his home early this morning 05/01/2013 with speech difficulty and inability to move his right side. He was last seen normal at 9:30 PM on 04/30/2013. CT scan of his head showed an 8 cm left parietal hemorrhage with extension into left lateral ventricle. There was mild mass effect as well as mild vasogenic edema. Blood pressure was markedly elevated at 247/132. Cardene drip was started for blood pressure management. NIH stroke score was 22. Patient was not a TPA candidate secondary to hemorrhage. He was admitted to the neuro ICU for further evaluation and treatment.  SUBJECTIVE Developed diaphoresis, hypertension and labored breathing. During the night. Refused to wear CPAP. Comfortable this morning, no acute issues.   OBJECTIVE Most recent Vital Signs: Filed Vitals:   05/06/13 0240 05/06/13 0357 05/06/13 0511 05/06/13 0615  BP: 179/104 167/98 191/114 174/97  Pulse: 74 72 78   Temp: 98.2 F (36.8 C) 98.4 F (36.9 C) 98.6 F (37 C)   TempSrc: Oral Oral Oral   Resp: 24 26 24    Height:      Weight:      SpO2: 96% 98% 98%    CBG (last 3)   Recent Labs  05/04/13 0809 05/04/13 1156 05/04/13 1554  GLUCAP 101* 127* 137*    IV Fluid Intake:   . sodium chloride 10 mL/hr at 05/04/13 1200    MEDICATIONS  . amLODipine  10 mg Oral Daily  . antiseptic oral rinse  15 mL Mouth Rinse q12n4p  . chlorhexidine  15 mL Mouth Rinse BID  . enoxaparin (LOVENOX) injection  40 mg Subcutaneous Q24H  . hydrochlorothiazide  12.5 mg Oral Daily  . pantoprazole sodium  40 mg Per Tube Daily  . potassium chloride  20 mEq Oral BID   PRN:  acetaminophen, acetaminophen, labetalol  Diet:  Dysphagia 1 honey thick liquids Activity:  Up with assistance DVT Prophylaxis:  SCDs  CLINICALLY SIGNIFICANT STUDIES Basic Metabolic Panel:   Recent Labs Lab 05/05/13 0453 05/06/13 0440  NA 141 142  K 3.5* 3.9   CL 102 104  CO2 20 24  GLUCOSE 100* 110*  BUN 19 25*  CREATININE 0.93 0.99  CALCIUM 9.0 9.4   Liver Function Tests:   Recent Labs Lab 05/01/13 0558  AST 18  ALT 21  ALKPHOS 75  BILITOT 0.6  PROT 6.6  ALBUMIN 3.3*   CBC:   Recent Labs Lab 05/01/13 0558  05/04/13 0346 05/06/13 0440  WBC 9.0  < > 13.4* 10.9*  NEUTROABS 6.6  --   --   --   HGB 16.6  < > 14.5 15.5  HCT 46.9  < > 43.2 44.1  MCV 87.7  < > 90.4 88.6  PLT 162  < > 180 169  < > = values in this interval not displayed. Coagulation:   Recent Labs Lab 05/01/13 0558  LABPROT 13.4  INR 1.04   Cardiac Enzymes:   Recent Labs Lab 05/01/13 0558  TROPONINI <0.30   Urinalysis:   Recent Labs Lab 05/01/13 0559  COLORURINE YELLOW  LABSPEC 1.015  PHURINE 7.5  GLUCOSEU NEGATIVE  HGBUR SMALL*  BILIRUBINUR NEGATIVE  KETONESUR NEGATIVE  PROTEINUR >300*  UROBILINOGEN 0.2  NITRITE NEGATIVE  LEUKOCYTESUR NEGATIVE   Lipid Panel     Component Value Date/Time   CHOL 192 05/01/2013 1445   TRIG 99 05/01/2013 1445   HDL 47 05/01/2013 1445  CHOLHDL 4.1 05/01/2013 1445   VLDL 20 05/01/2013 1445   LDLCALC 125* 05/01/2013 1445   HgbA1C  Lab Results  Component Value Date   HGBA1C 5.5 05/01/2013    Urine Drug Screen:     Component Value Date/Time   LABOPIA NONE DETECTED 05/01/2013 0559   COCAINSCRNUR NONE DETECTED 05/01/2013 0559   LABBENZ NONE DETECTED 05/01/2013 0559   AMPHETMU NONE DETECTED 05/01/2013 0559   THCU NONE DETECTED 05/01/2013 0559   LABBARB NONE DETECTED 05/01/2013 0559    Alcohol Level:   Recent Labs Lab 05/01/13 0558  ETH <11    CT of the brain 05/03/2013 Overall no significant change since the prior examination with the possible exception of decreased amount of blood within the dependent aspect of the right lateral ventricle.  05/02/2013    Left hemispheric hemorrhage and associated mass effect as detailed above. When compared to prior examination, the amount of blood  within the dependent aspect of the right lateral ventricle has increased.  Mild prominence of the right lateral ventricle unchanged from prior exam. Attention to this on follow up.    05/01/2013   1. Large 4.6 x 3.9 cm intraparenchymal hematoma at the left parietal lobe, adjacent to the left basal ganglia and thalamus. Extension of blood into the left lateral ventricle, with mild associated vasogenic edema. There is minimal if any midline shift at this time. Slight effacement of the left lateral ventricle, without evidence for obstruction. 2. Diffuse small vessel ischemic microangiopathy; chronic lacunar infarct within the inferior right basal ganglia. 3. Mild mucosal thickening within the maxillary sinuses.   2D Echocardiogram  EF 60-65% with no source of embolus.   Carotid Doppler  No evidence of hemodynamically significant internal carotid artery stenosis. Vertebral artery flow is antegrade.   CXR   05/05/2013 Increased mild diffuse interstitial infiltrates, suspicious for  interstitial edema. Stable cardiomegaly. 05/02/2013 Shallow inspiration. Cardiac enlargement with suggestion of early vascular congestion. No edema or consolidation.  EKG  normal EKG, normal sinus rhythm, unchanged from previous tracings.   Therapy Recommendations CIR  GENERAL EXAM: RESTLESS. Well developed. Neck is supple  CARDIOVASCULAR: Regular rate and rhythm, no murmurs, no carotid bruits  NEUROLOGIC: MENTAL STATUS: awake, alert; SIGNIFICANT EXPRESSIVE > RECEPTIVE APHASIA. ABLE TO FOLLOW, repeats "today is a sunny day". Follows simple commands with repeated requests (open/close eyes, show thumb on left) Not following commands on R side. CRANIAL NERVE: pupils equal and reactive to light, RIGHT HOMONYMOUS HEMIANOPSIA. LEFT GAZE PREFERENCE, BUT ABLE TO TRACK PAST MIDLINE TO THE RIGHT. DECR RIGHT LOWER FACIAL STRENGTH. Hearing intact, palate elevates symmetrically, uvula midline, shoulder shrug symmetric, tongue  midline. MOTOR: RUE FLACCID 0/5; RLE WITHDRAWAL TO STIM (WITH UPGOING TOE). LUE 4/5. LLE 4/5.  SENSORY: DECR ON RIGHT SIDE COORDINATION: UNABLE IN RIGHT SIDE DUE TO WEAKNESS. LIMITED COMPREHENSION FOR TESTING LEFT SIDE. REFLEXES: deep tendon reflexes TRACE and symmetric GAIT/STATION: BEDREST   ASSESSMENT [name] is a 63 y.o. male presenting with new onset expressive aphasia and right-sided weakness. Imaging confirms a  large left parietal parenchymal hemorrhage with intraventricular extension and cerebral edema. Hemorrhage felt to be secondary to malignant hypertension with BP 208/122 on admission.  On no antithrombotics prior to admission. Patient with resultant global aphasia, dysphagia and right hemiplegia, continues to be more awake with improvement. Work up completed.   Respiratory distress 12/29 and again 05/06/2012. Pt refused to wear CPAP, CCM following, they think he likely had OSA prior to admission, ? Aspiration but currently  afebrile, leukocytosis improving. Will monitor  Malignant hypertension, not on antihypertensives at home, on cardene drip at 7mg . norvasc added once tube placed for feeding 12/20. Off cardene. BP up to 193/107 last evening. Administered IV labetolol 10 through out the night. This am, BP better. Hyperlipidemia, LDL 125, on no statin PTA, now on no statin, goal LDL < 100 (< 70 for diabetics) Renal insufficiency, Cr 1.3, borderline UOP Leukocytosis, improving Hypokalemia, K 3.9, on standing dose Dysphagia, failed swallow eval, flexible feeding tube placed and tube feedings started. Has now passed swallow, diet started and tube removed. Chews tobacco  Hospital day # 5  TREATMENT/PLAN  Increase HCTZ to 25mg  daily  Monitor K   CPAP at night  Transfer to rehab once bed available  add statin at discharge   [name] Main, MSN, RN, ANVP-BC, ANP-BC, GNP-BC [facility] Pager: [phone] 05/06/2013 8:29 AM  I have personally obtained a  history, examined the patient, evaluated imaging results, and formulated the assessment and plan of care.  Elspeth Cho, DO Neurology-Stroke

## 2013-05-06 NOTE — Progress Notes (Signed)
RN has paged Dr. Hosie PoissonSumner twice today and twice yesterday regarding pt.'s BP.  Unsure of diastolic BP parameters.  Have paged to MD to confirm patient plan of care, since his BP continues to stay high.  Will continue to monitor patient.  Call light placed within reach.

## 2013-05-06 NOTE — Progress Notes (Signed)
Pt arrived to room 4W09 via bed; asleep, arouses easily. Non-verbal. Max assist to follow one step commands this eve. B/P 173/98, temp 100.3; monitor.  No family present; did attempt to orient pt to rehab and rehab process; unable to obtain admission info.  Report to eve shift.

## 2013-05-06 NOTE — Progress Notes (Signed)
Rehab admissions - I spoke with stroke team and patient medically cleared for acute inpatient rehab admission.  I spoke with patient's significant other and she is in agreement to inpatient rehab admission.  Bed available and will admit to inpatient rehab today.  Call me for questions.  #[phone]

## 2013-05-06 NOTE — Discharge Summary (Signed)
[name] Ellenwood, DO Neurology-Stroke 

## 2013-05-06 NOTE — Progress Notes (Signed)
Pt was placed on CPAP auto titrate with 3 L of O2 bled in. Pt stated he was comfortable and no distress noted.

## 2013-05-06 NOTE — H&P (Signed)
Physical Medicine and Rehabilitation Admission H&P  Chief Complaint   Patient presents with   .  Cerebrovascular Accident   :  Chief complaint: Weakness  HPI: [name] is a 63 y.o. male with history of HTN--no medication; who was found down on floor with inability to speak or move right side. 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 BP elevated at 247/132 and patient started on cardene drip for control--NIH stroke score-22. 2 D echo with EF 60-65%, mild MR, mod LA dilation. Carotid dopplers without significant ICA stenosis. Patient with resultant dysarthria, expressive>receptive aphsia, left gaze preference, right hemiparesis with sensory deficits. Therapies initiated yesterday-diet recently advanced to dysphagia 1 honey liquids .latest cranial CT scan stable and Lovenox has been initiated for DVT prophylaxis as of 05/05/2013. Patient with acute respiratory distress 12/29 pm requiring BIPAP/CPAP suspect untreated obstructive sleep apnea and follow pulmonary services. MD, PT, ST recommending CIR. patient was felt to be a good candidate for inpatient rehabilitation services and was admitted for comprehensive rehabilitation program   Aphasic. Mutters occ words  ROS Review of Systems  Unable to perform ROS: language  Past Medical History   Diagnosis  Date   .  Hypertension     History reviewed. No pertinent past surgical history.  History reviewed. No pertinent family history.  Social History: reports that he has been smoking. He does not have any smokeless tobacco history on file. His alcohol and drug histories are not on file.  Allergies: No Known Allergies  No prescriptions prior to admission    Home:  Home Living  Family/patient expects to be discharged to:: Inpatient  rehab  Living Arrangements: Spouse/significant other;Children  Available Help at Discharge: Family;Available 24 hours/day  Type of Home: House  Lives With: Spouse  Functional History:  Prior Function  Vocation: Full time employment  Functional Status:  Mobility:  Bed Mobility  Bed Mobility: Supine to Sit;Sit to Supine  Supine to Sit: 1: +2 Total assist;HOB flat  Supine to Sit: Patient Percentage: 50%  Sit to Supine: 1: +2 Total assist;HOB flat  Sit to Supine: Patient Percentage: 50%  Transfers  Transfers: Sit to Stand;Stand to Sit  Sit to Stand: 1: +2 Total assist;With upper extremity assist;From bed  Sit to Stand: Patient Percentage: 50%  Stand to Sit: 1: +2 Total assist;With upper extremity assist;To bed  Stand to Sit: Patient Percentage: 50%  Ambulation/Gait  Ambulation/Gait Assistance: Not tested (comment)   ADL:  ADL  Eating/Feeding: NPO  Grooming: Maximal assistance  Where Assessed - Grooming: Supported sitting  Upper Body Bathing: Maximal assistance  Where Assessed - Upper Body Bathing: Supported sitting  Lower Body Bathing: Maximal assistance  Where Assessed - Lower Body Bathing: Supine, head of bed up;Rolling right and/or left  Upper Body Dressing: +1 Total assistance  Where Assessed - Upper Body Dressing: Supported sitting  Lower Body Dressing: +1 Total assistance  Where Assessed - Lower Body Dressing: Supine, head of bed up;Rolling right and/or left  Toilet Transfer: (not assessed)  Transfers/Ambulation Related to ADLs: +2  Cognition:  Cognition  Overall Cognitive Status: Impaired/Different from baseline  Arousal/Alertness: Awake/alert  Orientation Level: Oriented to person;Oriented to place  Attention: Sustained  Sustained Attention: Impaired  Sustained Attention Impairment: Functional basic;Verbal basic  Memory: Impaired  Awareness: Impaired  Problem Solving: Impaired  Problem Solving Impairment: Functional basic;Verbal basic  Behaviors: Perseveration   Safety/Judgment: Impaired  Cognition  Arousal/Alertness: Awake/alert  Behavior During Therapy: WFL for tasks assessed/performed  Overall Cognitive Status: Impaired/Different from baseline  Area of Impairment: Safety/judgement;Awareness;Problem solving  Current Attention Level: Sustained  Following Commands: Follows one step commands consistently  Safety/Judgement: Decreased awareness of safety;Decreased awareness of deficits  Awareness: Intellectual  Problem Solving: Requires verbal cues;Requires tactile cues  Physical Exam:  Blood pressure 174/97, pulse 78, temperature 98.6 F (37 C), temperature source Oral, resp. rate 24, height $RemoveBe'5\' 10"'ipODJsmMQ$  (1.778 m), weight 88.2 kg (194 lb 7.1 oz), SpO2 98.00%.  Physical Exam  Constitutional: He appears well-developed and well-nourished.  HENT:  Head: Normocephalic and atraumatic.  Eyes: Conjunctivae are normal. Pupils are equal, round, and reactive to light.  Neck: Normal range of motion. Neck supple.  Cardiovascular: Regular rhythm. Tachycardia present.  Respiratory: Effort normal and breath sounds normal. No respiratory distress. He has no wheezes.  Mucopurulent sputum noted.  GI: Soft. Bowel sounds are normal. He exhibits no distension. There is no tenderness.  Neurological: He is alert.  Right inattention--does not turn eyes past midline. Flat affect with delayed processing. Restless and distracted. Minimal verbal output. Dense right hemiparesis with sensory deficits. Moves LUE/LLE with visual/verbal cues. Unable to follow simple commands. RUE and RLE are 0/5. Does seem to have some gross pain sense in the right leg. Did utter "ok" when I told him I would see him later.  Skin: Skin is warm and dry  Results for orders placed during the hospital encounter of 05/01/13 (from the past 48 hour(s))   GLUCOSE, CAPILLARY Status: Abnormal    Collection Time    05/04/13 11:56 AM   Result  Value  Range    Glucose-Capillary  127 (*)  70 - 99 mg/dL    GLUCOSE, CAPILLARY Status: Abnormal    Collection Time    05/04/13 3:54 PM   Result  Value  Range    Glucose-Capillary  137 (*)  70 - 99 mg/dL   BASIC METABOLIC PANEL Status: Abnormal    Collection Time    05/05/13 4:53 AM   Result  Value  Range    Sodium  141  137 - 147 mEq/L    Comment:  Please note change in reference range.    Potassium  3.5 (*)  3.7 - 5.3 mEq/L    Comment:  Please note change in reference range.    Chloride  102  96 - 112 mEq/L    CO2  20  19 - 32 mEq/L    Glucose, Bld  100 (*)  70 - 99 mg/dL    BUN  19  6 - 23 mg/dL    Creatinine, Ser  0.93  0.50 - 1.35 mg/dL    Calcium  9.0  8.4 - 10.5 mg/dL    GFR calc non Af Amer  88 (*)  >90 mL/min    GFR calc Af Amer  >90  >90 mL/min    Comment:  (NOTE)     The eGFR has been calculated using the CKD EPI equation.     This calculation has not been validated in all clinical situations.     eGFR's persistently <90 mL/min signify possible Chronic Kidney     Disease.   BASIC METABOLIC PANEL Status: Abnormal    Collection Time    05/06/13 4:40 AM   Result  Value  Range    Sodium  142  137 - 147 mEq/L    Comment:  Please note change in reference range.    Potassium  3.9  3.7 - 5.3 mEq/L    Comment:  Please note change in reference range.    Chloride  104  96 - 112 mEq/L    CO2  24  19 - 32 mEq/L    Glucose, Bld  110 (*)  70 - 99 mg/dL    BUN  25 (*)  6 - 23 mg/dL    Creatinine, Ser  0.99  0.50 - 1.35 mg/dL    Calcium  9.4  8.4 - 10.5 mg/dL    GFR calc non Af Amer  86 (*)  >90 mL/min    GFR calc Af Amer  >90  >90 mL/min    Comment:  (NOTE)     The eGFR has been calculated using the CKD EPI equation.     This calculation has not been validated in all clinical situations.     eGFR's persistently <90 mL/min signify possible Chronic Kidney     Disease.   CBC Status: Abnormal    Collection Time    05/06/13 4:40 AM   Result  Value  Range    WBC  10.9 (*)  4.0 - 10.5 K/uL    RBC  4.98  4.22 - 5.81 MIL/uL     Hemoglobin  15.5  13.0 - 17.0 g/dL    HCT  44.1  39.0 - 52.0 %    MCV  88.6  78.0 - 100.0 fL    MCH  31.1  26.0 - 34.0 pg    MCHC  35.1  30.0 - 36.0 g/dL    RDW  12.8  11.5 - 15.5 %    Platelets  169  150 - 400 K/uL    Dg Chest Port 1 View  05/05/2013 CLINICAL DATA: Shortness of breath. EXAM: PORTABLE CHEST - 1 VIEW COMPARISON: 05/02/2013 FINDINGS: Cardiomegaly is stable. Mild increase in diffuse interstitial infiltrates is suspicious for mild interstitial edema. No evidence of pulmonary consolidation or pleural effusion. IMPRESSION: Increased mild diffuse interstitial infiltrates, suspicious for interstitial edema. Stable cardiomegaly. Electronically Signed By: Earle Gell M.D. On: 05/05/2013 20:20  Dg Swallowing Func-speech Pathology  05/04/2013 Orbie Pyo [place], CCC-SLP 05/04/2013 11:58 AM Objective Swallowing Evaluation: Modified Barium Swallowing Study Patient Details Name: DUKE WEISENSEL MRN: [id number] Date of Birth: 1950-07-08 Today's Date: 05/04/2013 Time: 5621-3086 SLP Time Calculation (min): 20 min Past Medical History: Past Medical History Diagnosis Date . Hypertension Past Surgical History: History reviewed. No pertinent past surgical history. HPI: 63 y/o male with history of HTN found on floor of his home early in AM 05/01/13 with speech difficulty and inability to move his right side. CT scan showed 8cm left parietal hemorrhage with extension into left lateral ventricle. Mild mass effect as well as mild vasogenic edema. Pt. adequately alert and able to participate in MBS. Assessment / Plan / Recommendation Clinical Impression Dysphagia Diagnosis: Moderate oral phase dysphagia;Moderate pharyngeal phase dysphagia;Severe oral phase dysphagia Clinical impression: Pt. exhibited moderate-severe oral dysphagia with right poor cohesion, manipulation and sensation leading to right buccal cavity pocketing, leakage and delayed transit. Pharyngeal phase dysphagia is primarily sensory based with a mild  motor component and delayed swallow to lower pharynx ranging from 5-20 seconds. No significant difference between nectar and honey thick pharygneal residue, in addition, honey barium provided for a slower and more cohesive entrance to pyriform sinuses. Laryngeal penetration observed with thin (reflexive cough), nectar and honey (  flash). Pt. required max verbal and (intermittent) tactile cues to initiate second swallow (dry spoon). Pt. continues with a moderately high aspiration risk, however Dys 1 diet texture and honey thick liquids, full supervision and pills crushed recommended to decrease risk. ST services to continue on acute care. Treatment Recommendation Therapy as outlined in treatment plan below Diet Recommendation Dysphagia 1 (Puree);Honey-thick liquid Liquid Administration via: Cup;No straw Medication Administration: Crushed with puree Supervision: Full supervision/cueing for compensatory strategies;Staff to assist with self feeding Compensations: Slow rate;Small sips/bites;Clear throat intermittently (allow time for first swallow) Postural Changes and/or Swallow Maneuvers: Seated upright 90 degrees;Upright 30-60 min after meal Other Recommendations Oral Care Recommendations: Oral care Q4 per protocol Other Recommendations: Order thickener from pharmacy Follow Up Recommendations Inpatient Rehab Frequency and Duration min 2x/week 2 weeks Pertinent Vitals/Pain WDL Reason for Referral Objectively evaluate swallowing function Oral Phase Oral Preparation/Oral Phase Oral Phase: Impaired Oral - Honey Oral - Honey Teaspoon: Right anterior bolus loss;Weak lingual manipulation;Piecemeal swallowing;Right pocketing in lateral sulci;Delayed oral transit Oral - Honey Cup: Right anterior bolus loss;Weak lingual manipulation;Piecemeal swallowing;Right pocketing in lateral sulci;Delayed oral transit Oral - Nectar Oral - Nectar Teaspoon: Right anterior bolus loss;Weak lingual manipulation;Piecemeal swallowing;Right  pocketing in lateral sulci;Delayed oral transit Oral - Nectar Cup: Right anterior bolus loss;Weak lingual manipulation;Piecemeal swallowing;Right pocketing in lateral sulci;Delayed oral transit Oral - Thin Oral - Thin Teaspoon: Right anterior bolus loss;Weak lingual manipulation;Piecemeal swallowing;Right pocketing in lateral sulci;Delayed oral transit Oral - Solids Oral - Puree: Delayed oral transit;Right pocketing in lateral sulci Pharyngeal Phase Pharyngeal Phase Pharyngeal Phase: Impaired Pharyngeal - Honey Pharyngeal - Honey Teaspoon: Pharyngeal residue - pyriform sinuses;Pharyngeal residue - valleculae;Premature spillage to valleculae;Within functional limits;Delayed swallow initiation Pharyngeal - Honey Cup: Delayed swallow initiation;Premature spillage to valleculae;Premature spillage to pyriform sinuses;Pharyngeal residue - valleculae;Pharyngeal residue - pyriform sinuses;Penetration/Aspiration during swallow Penetration/Aspiration details (honey cup): Material enters airway, remains ABOVE vocal cords then ejected out Pharyngeal - Nectar Pharyngeal - Nectar Teaspoon: Delayed swallow initiation;Premature spillage to pyriform sinuses;Pharyngeal residue - valleculae;Pharyngeal residue - pyriform sinuses;Reduced tongue base retraction;Reduced laryngeal elevation;Penetration/Aspiration during swallow Penetration/Aspiration details (nectar teaspoon): Material enters airway, remains ABOVE vocal cords then ejected out Pharyngeal - Nectar Cup: Delayed swallow initiation;Premature spillage to pyriform sinuses;Penetration/Aspiration during swallow (deeper penetration) Penetration/Aspiration details (nectar cup): Material enters airway, CONTACTS cords then ejected out Pharyngeal - Thin Pharyngeal - Thin Teaspoon: Delayed swallow initiation;Premature spillage to valleculae;Premature spillage to pyriform sinuses;Penetration/Aspiration during swallow (from pyriform sinus residue) Penetration/Aspiration details (thin  teaspoon): Material enters airway, CONTACTS cords then ejected out Pharyngeal - Solids Pharyngeal - Puree: Delayed swallow initiation;Pharyngeal residue - valleculae;Reduced tongue base retraction Cervical Esophageal Phase GO Cervical Esophageal Phase Cervical Esophageal Phase: Darryll Capers [place] Mon.Shonna Chock Pager [phone] 05/04/2013   Post Admission Physician Evaluation:  1. Functional deficits secondary to Left parietal ICH. 2. Patient is admitted to receive collaborative, interdisciplinary care between the physiatrist, rehab nursing staff, and therapy team. 3. Patient's level of medical complexity and substantial therapy needs in context of that medical necessity cannot be provided at a lesser intensity of care such as a SNF. 4. Patient has experienced substantial functional loss from his/her baseline which was documented above under the "Functional History" and "Functional Status" headings. Judging by the patient's diagnosis, physical exam, and functional history, the patient has potential for functional progress which will result in measurable gains while on inpatient rehab. These gains will be of substantial and practical use upon discharge in facilitating mobility and self-care at the household level. 5. Physiatrist  will provide 24 hour management of medical needs as well as oversight of the therapy plan/treatment and provide guidance as appropriate regarding the interaction of the two. 6. 24 hour rehab nursing will assist with bladder management, bowel management, safety, skin/wound care, disease management, medication administration, pain management and patient education and help integrate therapy concepts, techniques,education, etc. 7. PT will assess and treat for/with: pre gait, gait training, endurance , safety, equipment, neuromuscular re education. Goals are: Min A mob. 8. OT will assess and treat for/with: ADLs, Cognitive perceptual skills, Neuromuscular re education, safety,  endurance, equipment. Goals are: Min A ADL. 9. SLP will assess and treat for/with: Swallow, language. Goals are: adequate oral intake and express basic needs. 10. Case Management and Social Worker will assess and treat for psychological issues and discharge planning. 11. Team conference will be held weekly to assess progress toward goals and to determine barriers to discharge. 12. Patient will receive at least 3 hours of therapy per day at least 5 days per week. 13. ELOS: express basic needs, Intake of adequate caloric nutrition and hydration  14. Prognosis: good Medical Problem List and Plan:  1. 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 DVT Prophylaxis/Anticoagulation: Subcutaneous Lovenox initiated 05/05/2013. Monitor platelet counts any signs of bleeding  3. Pain Management: Tylenol as needed  4. Neuropsych: This patient is not capable of making decisions on his own behalf.  5. Dysphagia. Dysphagia 1 honey thick liquids. Monitor closely for any aspiration. Followup speech therapy  6. Hypertension. Norvasc 10 mg daily, hydrochlorothiazide 25 mg daily. Monitor with increased mobility  7. OSA. CPAP. Will follow pulmonary services as needed   Charlett Blake M.D. [facility] FAAPM&R (Sports Med, Neuromuscular Med) Diplomate Am Board of Electrodiagnostic Med Diplomate Am Board of Pain Medicine Fellow Am Board of Interventional Pain Physicians  05/06/2013

## 2013-05-07 ENCOUNTER — Inpatient Hospital Stay (HOSPITAL_COMMUNITY): Payer: Medicare Other | Admitting: Occupational Therapy

## 2013-05-07 ENCOUNTER — Inpatient Hospital Stay (HOSPITAL_COMMUNITY): Payer: Medicare Other | Admitting: Physical Therapy

## 2013-05-07 ENCOUNTER — Inpatient Hospital Stay (HOSPITAL_COMMUNITY): Payer: Medicare Other

## 2013-05-07 DIAGNOSIS — M25469 Effusion, unspecified knee: Secondary | ICD-10-CM | POA: Diagnosis not present

## 2013-05-07 DIAGNOSIS — I619 Nontraumatic intracerebral hemorrhage, unspecified: Secondary | ICD-10-CM

## 2013-05-07 DIAGNOSIS — I10 Essential (primary) hypertension: Secondary | ICD-10-CM

## 2013-05-07 LAB — CBC WITH DIFFERENTIAL/PLATELET
BASOS PCT: 0 % (ref 0–1)
Basophils Absolute: 0 10*3/uL (ref 0.0–0.1)
Eosinophils Absolute: 0.1 10*3/uL (ref 0.0–0.7)
Eosinophils Relative: 1 % (ref 0–5)
HCT: 45 % (ref 39.0–52.0)
Hemoglobin: 15.6 g/dL (ref 13.0–17.0)
Lymphocytes Relative: 9 % — ABNORMAL LOW (ref 12–46)
Lymphs Abs: 1 10*3/uL (ref 0.7–4.0)
MCH: 30.9 pg (ref 26.0–34.0)
MCHC: 34.7 g/dL (ref 30.0–36.0)
MCV: 89.1 fL (ref 78.0–100.0)
Monocytes Absolute: 1.3 10*3/uL — ABNORMAL HIGH (ref 0.1–1.0)
Monocytes Relative: 12 % (ref 3–12)
NEUTROS ABS: 8.5 10*3/uL — AB (ref 1.7–7.7)
NEUTROS PCT: 78 % — AB (ref 43–77)
PLATELETS: 219 10*3/uL (ref 150–400)
RBC: 5.05 MIL/uL (ref 4.22–5.81)
RDW: 12.8 % (ref 11.5–15.5)
WBC: 10.9 10*3/uL — ABNORMAL HIGH (ref 4.0–10.5)

## 2013-05-07 MED ORDER — HYDROCODONE-ACETAMINOPHEN 5-325 MG PO TABS
1.0000 | ORAL_TABLET | ORAL | Status: DC | PRN
  Administered 2013-05-07 – 2013-06-06 (×20): 1 via ORAL
  Filled 2013-05-07 (×21): qty 1

## 2013-05-07 NOTE — Progress Notes (Addendum)
PT notified rn that patient's right knee was warm to touch with some swelling. Some pain when PT moved that right leg. RN called MD with orders for xray, cb, ice pack and pain meds. Will monitor. Last PT held due to pain.  Spoke to patient's significant other [employer]( [name] Ragland) today and she has mentioned that the patient has been having some swelling on his right knee and he has not have it checked. He also gets swelling to both hands at times. Will monitor.

## 2013-05-07 NOTE — Progress Notes (Signed)
Patient ID: [name], male   DOB: 06/06/1950, 63 y.o.   MRN: [id number]  75/53.  63 year old patient who was admitted to the rehabilitation service yesterday for further treatment following a left parietal ICH. Patient has a history of hypertension that has not been well controlled since his admission yesterday. Labetalol 100 mg twice daily added to his regimen  Physical Medicine and Rehabilitation Admission H&P  Chief Complaint   Patient presents with   .  Cerebrovascular Accident   :  Chief complaint: Weakness  HPI: [name] is a 63 y.o. male with history of HTN--no medication; who was found down on floor with inability to speak or move right side. 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 BP elevated at 247/132 and patient started on cardene drip for control--NIH stroke score-22. 2 D echo with EF 60-65%, mild MR, mod LA dilation. Carotid dopplers without significant ICA stenosis. Patient with resultant dysarthria, expressive>receptive aphsia, left gaze preference, right hemiparesis with sensory deficits. Therapies initiated yesterday-diet recently advanced to dysphagia 1 honey liquids .latest cranial CT scan stable and Lovenox has been initiated for DVT prophylaxis as of 05/05/2013. Patient with acute respiratory distress 12/29 pm requiring BIPAP/CPAP suspect untreated obstructive sleep apnea and follow pulmonary services. MD, PT, ST recommending CIR. patient was felt to be a good candidate for inpatient rehabilitation services and was admitted for comprehensive rehabilitation program  Aphasic. Mutters occ words  ROS Review of Systems  Unable to perform ROS: language  Past Medical History   Diagnosis  Date   .  Hypertension    History reviewed. No pertinent past surgical  history.  History reviewed. No pertinent family history.  Social History: reports that he has been smoking. He does not have any smokeless tobacco history on file. His alcohol and drug histories are not on file.  Allergies: No Known Allergies  No prescriptions prior to admission     Physical Exam:  Blood pressure 174/97, pulse 78, temperature 98.6 F (37 C), temperature source Oral, resp. rate 24, height _0  (1.778 m), weight 88.2 kg (194 lb 7.1 oz), SpO2 98.00%.  Physical Exam  Constitutional: He appears well-developed and well-nourished.  HENT:  Head: Normocephalic and atraumatic.  Eyes: Conjunctivae are normal. Pupils are equal, round, and reactive to light.  Neck: Normal range of motion. Neck supple.  Cardiovascular: Regular rhythm. Tachycardia present.  Respiratory: Effort normal and breath sounds normal. No respiratory distress. He has no wheezes.   GI: Soft. Bowel sounds are normal. He exhibits no distension. There is no tenderness.  Neurological: He is alert. Dense right hemiparesis. Little verbal response  Skin: Skin is warm and dry ; SCDs in place    Intake/Output Summary (Last 24 hours) at 05/07/13 0950 Last data filed at 05/07/13 0800  Gross per 24 hour  Intake     50 ml  Output    600 ml  Net   -550 ml   Patient Vitals for the past 24 hrs:  BP Temp Temp src Pulse Resp SpO2 Height Weight  05/07/13 0625 176/95 mmHg 98.9 F (37.2 C) Oral 72 18 97 % - -  05/07/13 0025 164/88 mmHg 99.1 F (37.3 C) Axillary 77 17 96 % - -  05/06/13 2300 182/104 mmHg 97.9 F (36.6 C) Oral  69 16 93 % - -  05/06/13 2200 200/100 mmHg - - 82 17 95 % - -  05/06/13 2030 180/98 mmHg - - 82 20 96 % - -  05/06/13 1850 173/98 mmHg 100.4 F (38 C) Oral 79 26 93 % _0  (1.956 m) 83.462 kg (184 lb)     Results for orders placed during the hospital encounter of 05/01/13 (from the past 48 hour(s))   GLUCOSE, CAPILLARY Status: Abnormal    Collection Time    05/04/13 11:56 AM   Result  Value   Range    Glucose-Capillary  127 (*)  70 - 99 mg/dL   GLUCOSE, CAPILLARY Status: Abnormal    Collection Time    05/04/13 3:54 PM   Result  Value  Range    Glucose-Capillary  137 (*)  70 - 99 mg/dL   BASIC METABOLIC PANEL Status: Abnormal    Collection Time    05/05/13 4:53 AM   Result  Value  Range    Sodium  141  137 - 147 mEq/L    Comment:  Please note change in reference range.    Potassium  3.5 (*)  3.7 - 5.3 mEq/L    Comment:  Please note change in reference range.    Chloride  102  96 - 112 mEq/L    CO2  20  19 - 32 mEq/L    Glucose, Bld  100 (*)  70 - 99 mg/dL    BUN  19  6 - 23 mg/dL    Creatinine, Ser  0.93  0.50 - 1.35 mg/dL    Calcium  9.0  8.4 - 10.5 mg/dL    GFR calc non Af Amer  88 (*)  >90 mL/min    GFR calc Af Amer  >90  >90 mL/min    Comment:  (NOTE)     The eGFR has been calculated using the CKD EPI equation.     This calculation has not been validated in all clinical situations.     eGFR's persistently <90 mL/min signify possible Chronic Kidney     Disease.   BASIC METABOLIC PANEL Status: Abnormal    Collection Time    05/06/13 4:40 AM   Result  Value  Range    Sodium  142  137 - 147 mEq/L    Comment:  Please note change in reference range.    Potassium  3.9  3.7 - 5.3 mEq/L    Comment:  Please note change in reference range.    Chloride  104  96 - 112 mEq/L    CO2  24  19 - 32 mEq/L    Glucose, Bld  110 (*)  70 - 99 mg/dL    BUN  25 (*)  6 - 23 mg/dL    Creatinine, Ser  0.99  0.50 - 1.35 mg/dL    Calcium  9.4  8.4 - 10.5 mg/dL    GFR calc non Af Amer  86 (*)  >90 mL/min    GFR calc Af Amer  >90  >90 mL/min    Comment:  (NOTE)     The eGFR has been calculated using the CKD EPI equation.     This calculation has not been validated in all clinical situations.     eGFR's persistently <90 mL/min signify possible Chronic Kidney     Disease.   CBC Status: Abnormal    Collection Time    05/06/13 4:40 AM   Result  Value  Range    WBC  10.9 (*)  4.0 -  10.5 K/uL    RBC  4.98  4.22 - 5.81 MIL/uL    Hemoglobin  15.5  13.0 - 17.0 g/dL    HCT  44.1  39.0 - 52.0 %    MCV  88.6  78.0 - 100.0 fL    MCH  31.1  26.0 - 34.0 pg    MCHC  35.1  30.0 - 36.0 g/dL    RDW  12.8  11.5 - 15.5 %    Platelets  169  150 - 400 K/uL   Dg Chest Port 1 View  05/05/2013 CLINICAL DATA: Shortness of breath. EXAM: PORTABLE CHEST - 1 VIEW COMPARISON: 05/02/2013 FINDINGS: Cardiomegaly is stable. Mild increase in diffuse interstitial infiltrates is suspicious for mild interstitial edema. No evidence of pulmonary consolidation or pleural effusion. IMPRESSION: Increased mild diffuse interstitial infiltrates, suspicious for interstitial edema. Stable cardiomegaly. Electronically Signed By: Earle Gell M.D. On: 05/05/2013 20:20  Dg Swallowing Func-speech Pathology  05/04/2013 Orbie Pyo [place], CCC-SLP 05/04/2013 11:58 AM Objective Swallowing Evaluation: Modified Barium Swallowing Study Patient Details Name: [name] MRN: [id number] Date of Birth: 09-29-1950 Today's Date: 05/04/2013 Time: 8453-6468 SLP Time Calculation (min): 20 min Past Medical History: Past Medical History Diagnosis Date . Hypertension Past Surgical History: History reviewed. No pertinent past surgical history. HPI: 63 y/o male with history of HTN found on floor of his home early in AM 05/01/13 with speech difficulty and inability to move his right side. CT scan showed 8cm left parietal hemorrhage with extension into left lateral ventricle. Mild mass effect as well as mild vasogenic edema. Pt. adequately alert and able to participate in MBS. Assessment / Plan / Recommendation Clinical Impression Dysphagia Diagnosis: Moderate oral phase dysphagia;Moderate pharyngeal phase dysphagia;Severe oral phase dysphagia Clinical impression: Pt. exhibited moderate-severe oral dysphagia with right poor cohesion, manipulation and sensation leading to right buccal cavity pocketing, leakage and delayed transit. Pharyngeal phase  dysphagia is primarily sensory based with a mild motor component and delayed swallow to lower pharynx ranging from 5-20 seconds. No significant difference between nectar and honey thick pharygneal residue, in addition, honey barium provided for a slower and more cohesive entrance to pyriform sinuses. Laryngeal penetration observed with thin (reflexive cough), nectar and honey (flash). Pt. required max verbal and (intermittent) tactile cues to initiate second swallow (dry spoon). Pt. continues with a moderately high aspiration risk, however Dys 1 diet texture and honey thick liquids, full supervision and pills crushed recommended to decrease risk. ST services to continue on acute care. Treatment Recommendation Therapy as outlined in treatment plan below Diet Recommendation Dysphagia 1 (Puree);Honey-thick liquid Liquid Administration via: Cup;No straw Medication Administration: Crushed with puree Supervision: Full supervision/cueing for compensatory strategies;Staff to assist with self feeding Compensations: Slow rate;Small sips/bites;Clear throat intermittently (allow time for first swallow) Postural Changes and/or Swallow Maneuvers: Seated upright 90 degrees;Upright 30-60 min after meal Other Recommendations Oral Care Recommendations: Oral care Q4 per protocol Other Recommendations: Order thickener from pharmacy Follow Up Recommendations Inpatient Rehab Frequency and Duration min 2x/week 2 weeks Pertinent Vitals/Pain WDL Reason for Referral Objectively evaluate swallowing function Oral Phase Oral Preparation/Oral Phase Oral Phase: Impaired Oral - Honey Oral - Honey Teaspoon: Right anterior bolus loss;Weak lingual manipulation;Piecemeal swallowing;Right pocketing in lateral sulci;Delayed oral transit Oral - Honey Cup: Right anterior bolus loss;Weak lingual manipulation;Piecemeal swallowing;Right pocketing in lateral sulci;Delayed oral transit Oral - Nectar Oral - Nectar Teaspoon: Right anterior bolus loss;Weak  lingual manipulation;Piecemeal swallowing;Right pocketing  in lateral sulci;Delayed oral transit Oral - Nectar Cup: Right anterior bolus loss;Weak lingual manipulation;Piecemeal swallowing;Right pocketing in lateral sulci;Delayed oral transit Oral - Thin Oral - Thin Teaspoon: Right anterior bolus loss;Weak lingual manipulation;Piecemeal swallowing;Right pocketing in lateral sulci;Delayed oral transit Oral - Solids Oral - Puree: Delayed oral transit;Right pocketing in lateral sulci Pharyngeal Phase Pharyngeal Phase Pharyngeal Phase: Impaired Pharyngeal - Honey Pharyngeal - Honey Teaspoon: Pharyngeal residue - pyriform sinuses;Pharyngeal residue - valleculae;Premature spillage to valleculae;Within functional limits;Delayed swallow initiation Pharyngeal - Honey Cup: Delayed swallow initiation;Premature spillage to valleculae;Premature spillage to pyriform sinuses;Pharyngeal residue - valleculae;Pharyngeal residue - pyriform sinuses;Penetration/Aspiration during swallow Penetration/Aspiration details (honey cup): Material enters airway, remains ABOVE vocal cords then ejected out Pharyngeal - Nectar Pharyngeal - Nectar Teaspoon: Delayed swallow initiation;Premature spillage to pyriform sinuses;Pharyngeal residue - valleculae;Pharyngeal residue - pyriform sinuses;Reduced tongue base retraction;Reduced laryngeal elevation;Penetration/Aspiration during swallow Penetration/Aspiration details (nectar teaspoon): Material enters airway, remains ABOVE vocal cords then ejected out Pharyngeal - Nectar Cup: Delayed swallow initiation;Premature spillage to pyriform sinuses;Penetration/Aspiration during swallow (deeper penetration) Penetration/Aspiration details (nectar cup): Material enters airway, CONTACTS cords then ejected out Pharyngeal - Thin Pharyngeal - Thin Teaspoon: Delayed swallow initiation;Premature spillage to valleculae;Premature spillage to pyriform sinuses;Penetration/Aspiration during swallow (from pyriform sinus  residue) Penetration/Aspiration details (thin teaspoon): Material enters airway, CONTACTS cords then ejected out Pharyngeal - Solids Pharyngeal - Puree: Delayed swallow initiation;Pharyngeal residue - valleculae;Reduced tongue base retraction Cervical Esophageal Phase GO Cervical Esophageal Phase Cervical Esophageal Phase: Darryll Capers [place] Mon.Shonna Chock Pager [phone] 05/04/2013   Medical Problem List and Plan:  1. 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 DVT Prophylaxis/Anticoagulation: Subcutaneous Lovenox initiated 05/05/2013. Monitor platelet counts any signs of bleeding;   Continue SCDs 3. Hypertension. Norvasc 10 mg daily, hydrochlorothiazide 25 mg daily.  atenolol 100 mg twice daily added to his regimen yesterday. Blood pressure appears to be  trending down   Charlett Blake M.D.  [facility]  FAAPM&R (Sports Med, Neuromuscular Med)  Diplomate Am Board of Electrodiagnostic Med  Diplomate Am Board of Pain Medicine  Fellow Am Board of Interventional Pain Physicians  05/06/2013

## 2013-05-07 NOTE — Progress Notes (Signed)
Attempted to place pt on CPAP tonight but pt did not tolerate. Pt continued to pull at mask. Placed pt on 2L N/C. RN aware. No distress noted at this time.

## 2013-05-07 NOTE — Evaluation (Signed)
Occupational Therapy Assessment and Plan  Patient Details  Name: [name] MRN: [id number] Date of Birth: 1951/02/13  OT Diagnosis: abnormal posture, acute pain, altered mental status, apraxia, cognitive deficits, flaccid hemiplegia and hemiparesis, hemiplegia affecting dominant side and muscle weakness (generalized) Rehab Potential: Rehab Potential: Good ELOS: 26-28 days   Today's Date: 05/07/2013 Time: 0901-1000 Time Calculation (min): 59 min  Second Session: Time:  11:31-12:00 Time Calculation (min): 29 mins  Problem List:  Patient Active Problem List   Diagnosis Date Noted  . probable undiagnosed OSA on CPAP 05/06/2013  . Other and unspecified hyperlipidemia 05/06/2013  . Hypokalemia 05/06/2013  . Chews tobacco 05/06/2013  . Dysphagia, secondary to stroke 05/06/2013  . Malignant hypertension 05/02/2013  . Acute respiratory distress 05/02/2013  . Tobacco abuse 05/02/2013  . 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 Hypertension    Past Surgical History: No past surgical history on file.  Assessment & Plan Clinical Impression: Patient is a 63 y.o. year old male with recent admission to the hospital on 05/01/13 and found to have Large 4.6 x 3.9 cm intraparenchymal hematoma at the left parietal lobe, adjacent to the left basal ganglia and thalamus with extension of blood into the left lateral ventricle, with mild associated vasogenic edema. BP elevated at 247/132 and patient started on cardene drip for control--NIH stroke score-22.  Patient transferred to CIR on 05/06/2013 .    Patient currently requires total with basic self-care skills secondary to muscle weakness, impaired timing and sequencing, unbalanced muscle activation, motor apraxia, decreased coordination and decreased motor planning, decreased midline orientation, decreased attention to right, decreased motor  planning and ideational apraxia and decreased awareness, decreased problem solving, decreased safety awareness, decreased memory and delayed processing.  Prior to hospitalization, patient could complete ADLS and IADLS with independent .  Patient will benefit from skilled intervention to decrease level of assist with basic self-care skills and increase independence with basic self-care skills prior to discharge home with care partner.  Anticipate patient will require minimal physical assistance and follow up home health.  OT - End of Session Activity Tolerance: Decreased this session;Tolerates 10 - 20 min activity with multiple rests Endurance Deficit: Yes Endurance Deficit Description: Pt with dyspnea 2/4 when sitting EOB and working on sitting balance. OT Assessment Rehab Potential: Good OT Patient demonstrates impairments in the following area(s): Balance;Perception;Cognition;Sensory;Safety;Endurance;Motor OT Basic ADL's Functional Problem(s): Eating;Grooming;Bathing;Dressing;Toileting OT Transfers Functional Problem(s): Toilet;Tub/Shower OT Additional Impairment(s): Fuctional Use of Upper Extremity OT Plan OT Intensity: Minimum of 1-2 x/day, 45 to 90 minutes OT Frequency: 5 out of 7 days OT Duration/Estimated Length of Stay: 26-28 days OT Treatment/Interventions: Balance/vestibular training;Cognitive remediation/compensation;Community reintegration;Discharge planning;DME/adaptive equipment instruction;Functional electrical stimulation;Neuromuscular re-education;Patient/family education;Pain management;Functional mobility training;Therapeutic Activities;UE/LE Strength taining/ROM;Visual/perceptual remediation/compensation;UE/LE Coordination activities;Therapeutic Exercise;Splinting/orthotics;Self Care/advanced ADL retraining OT Self Feeding Anticipated Outcome(s): supervision OT Basic Self-Care Anticipated Outcome(s): min assist level OT Toileting Anticipated Outcome(s): min assist level OT  Bathroom Transfers Anticipated Outcome(s): min assist level OT Recommendation Patient destination: Home Follow Up Recommendations: 24 hour supervision/assistance;Home health OT Equipment Recommended: 3 in 1 bedside comode;Tub/shower bench   Skilled Therapeutic Intervention Second Session:  Pt transferred supine to sit EOB with total assist.  Pt resistant to participating in therapy initially but eventually agreed when it was explained that therapist would help him get back to bed.  Pt needing total assist for sitting statically EOB.  Increased falling/pushing to the right side.  Pt extends his left elbow at all times to push himself over and when therapist had him come down on his forearm to help decrease pushing he would only maintain for a few seconds.  Pt constantly searching for stable object to grab hold of and push with his LUE.  Transferred stand pivot to the wheelchair, placed on the right side with total assist.  Performed [place] transfer to return back to bed going to the left side.  Pt with increased frustration with therapist during session when therapist was holding his left arm to keep him from pushing.  Inconsistent responses to questioning and at times he would just shut his eyes and did not attempt to answer.  Positioned RUE on pillow in bed at end of session.  OT Evaluation Precautions/Restrictions  Precautions Precautions: Fall Precaution Comments: R sided inattention,Dense Right hemiparesis,  pusher toward Right, global aphasia deficits Restrictions Weight Bearing Restrictions: No  Vital Signs Therapy Vitals Pulse Rate: 75 Oxygen Therapy SpO2: 94 % O2 Device: None (Room air) Pain Pain Assessment Pain Assessment: Faces Pain Score: 4 Faces Pain Scale: Hurts little more Pain Type: Acute pain Pain Location: Leg Pain Orientation: Right Pain Intervention(s): Repositioned Multiple Pain Sites: No Home Living/Prior Functioning Home Living Available Help at Discharge:  Available 24 hours/day;Family Felicie Morn daughter of Annamaria Boots is a CNA and will be available some at discharge) Type of Home: House (rental x 11 yrs) Home Access: Stairs to enter Secretary/administrator of Steps: 2 Entrance Stairs-Rails: Right;Left;Can reach both Home Layout: One level  Lives With: Significant other (lives with GF x 11 years) Prior Function Level of Independence: Independent with basic ADLs;Independent with gait;Independent with transfers  Able to Take Stairs?: Yes Driving: Yes Vocation: On disability (shoulder major rotator cuff tear and ACDF caused disability) Leisure: Hobbies-yes (Comment) 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 Praxis Praxis: Impaired Praxis Impairment Details: Initiation;Ideomotor;Perseveration Praxis-Other Comments: Pt with motor planning difficulty when performing bathing tasks or bed mobility.   Cognition Overall Cognitive Status: Impaired/Different from baseline Arousal/Alertness: Awake/alert Attention: Sustained Sustained Attention: Appears intact Memory: Impaired Awareness: Impaired Problem Solving: Impaired Problem Solving Impairment: Functional basic Behaviors: Restless Safety/Judgment: Impaired Comments: Pt inconsistent with following one step commands during session related to selfcare tasks.  In one instance handed pt washcoth and told him to wash his face and he did.  Then when told to place the  washcloth in the washpan he tossed it over in the floor. Sensation Sensation Light Touch: Impaired Detail Light Touch Impaired Details: Impaired RUE Stereognosis: Impaired Detail Stereognosis Impaired Details: Impaired RUE Hot/Cold: Impaired Detail Hot/Cold Impaired Details: Impaired RUE Proprioception: Impaired Detail Proprioception Impaired Details: Impaired RUE Additional Comments: Pt unable to detect light touch or pinch in the right arm.  Difficult to accurrately assess secondary to receptive difficulties. Coordination Gross Motor Movements are Fluid and Coordinated: No Fine Motor Movements are Fluid and Coordinated: No Coordination and Movement Description: Pt currenlty Brunnstrum I in the right arm and hand. Motor  Motor Motor: Hemiplegia;Abnormal postural alignment and control Motor - Skilled Clinical  Observations: Pt with no attempted active movement in the RUE or LE to command or with functional ADL.  Sits in a posterior pelvic tilt with increased lean/pushing to the right side.   Mobility  Bed Mobility Bed Mobility: Rolling Right;Rolling Left;Sit to Supine;Supine to Sit Rolling Right: 2: Max assist;With rail Rolling Right Details: Verbal cues for sequencing;Manual facilitation for placement Rolling Left: 1: +1 Total assist Rolling Left Details: Manual facilitation for placement;Manual facilitation for weight shifting;Verbal cues for technique Supine to Sit: 1: +1 Total assist Supine to Sit Details: Manual facilitation for placement;Manual facilitation for weight shifting Supine to Sit Details (indicate cue type and reason): Pt needed assistance with moving the RLE over to the edge of the bed and also to bring trunk into sitting position. Sit to Supine: 1: +1 Total assist Sit to Supine - Details: Manual facilitation for weight bearing;Manual facilitation for weight shifting;Tactile cues for weight shifting  Trunk/Postural Assessment  Cervical Assessment Cervical  Assessment: Within Functional Limits Thoracic Assessment Thoracic Assessment: Within Functional Limits Lumbar Assessment Lumbar Assessment: Within Functional Limits Postural Control Postural Control: Deficits on evaluation Trunk Control: Pt sits with increased weightbearing over the right hip and increased posterior pelvic tilt. Righting Reactions: Pt with delay reactions with LOB to the right.  Pt reports feeling he is falling to the left even though he is severly leaning to the right side.  Balance Balance Balance Assessed: Yes Static Sitting Balance Static Sitting - Balance Support: Left upper extremity supported;Feet supported Static Sitting - Level of Assistance: 1: +1 Total assist Static Sitting - Comment/# of Minutes: Pt pushing heavily to the right side in sitting. Dynamic Sitting Balance Dynamic Sitting - Balance Support: Left upper extremity supported;Feet supported Dynamic Sitting - Level of Assistance: 1: +1 Total assist Extremity/Trunk Assessment RUE Assessment RUE Assessment: Exceptions to [facility] RUE Strength RUE Overall Strength Comments: Pt with PROM WFLs for all joints of the RUE.  No active movement noted to command or with functional use.  Pt currently Brunnstrom stage I movement in the RUE. LUE Assessment LUE Assessment: Within Functional Limits  FIM:  FIM - Grooming Grooming Steps: Wash, rinse, dry face Grooming: 3: Patient completes 2 of 4 or 3 of 5 steps FIM - Bathing Bathing Steps Patient Completed: Chest;Abdomen;Left upper leg Bathing: 1: Two helpers FIM - Upper Body Dressing/Undressing Upper body dressing/undressing: 0: Wears gown/pajamas-no public clothing FIM - Lower Body Dressing/Undressing Lower body dressing/undressing: 1: Total-Patient completed less than 25% of tasks FIM - Bed/Chair Transfer Bed/Chair Transfer: 1: Supine > Sit: Total A (helper does all/Pt. < 25%)   Refer to Care Plan for Long Term Goals  Recommendations for other services:  None  Discharge Criteria: Patient will be discharged from OT if patient refuses treatment 3 consecutive times without medical reason, if treatment goals not met, if there is a change in medical status, if patient makes no progress towards goals or if patient is discharged from hospital.  The above assessment, treatment plan, treatment alternatives and goals were discussed and mutually agreed upon: No family available/patient unable  During session worked on selfcare retraining sitting EOB and in supine position.  Pt with severely impaired midline perception leading to increased pushing to the right side in sitting.  He was unable to selfcorrect and needed total assist to maintain sitting balance.  Pt with no noted attempted use or movement in the RUE and expressed pain when the RLE was flexed to assist with bedmobility.  Sariyah Corcino OTR/L  05/07/2013, 12:27 PM

## 2013-05-07 NOTE — Evaluation (Signed)
Physical Therapy Assessment and Plan  Patient Details  Name: [name] MRN: [id number] Date of Birth: 08/16/1950  PT Diagnosis: Abnormal posture, Abnormality of gait, Coordination disorder, Hemiplegia dominant, Impaired cognition, Impaired sensation and Pain in joint Rehab Potential: Good ELOS: 4 weeks   Today's Date: 05/07/2013 Time: 1000-1100 Time Calculation (min): 60 min  Problem List:  Patient Active Problem List   Diagnosis Date Noted  . probable undiagnosed OSA on CPAP 05/06/2013  . Other and unspecified hyperlipidemia 05/06/2013  . Hypokalemia 05/06/2013  . Chews tobacco 05/06/2013  . Dysphagia, secondary to stroke 05/06/2013  . Malignant hypertension 05/02/2013  . Acute respiratory distress 05/02/2013  . Tobacco abuse 05/02/2013  . 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 Hypertension    Past Surgical History: No past surgical history on file.  Assessment & Plan Clinical Impression: [name] is a 62 y.o. male with history of HTN--no medication; who was found down on floor with inability to speak or move right side. 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 BP elevated at 247/132 and patient started on cardene drip for control--NIH stroke score-22. 2 D echo with EF 60-65%, mild MR, mod LA dilation. Carotid dopplers without significant ICA stenosis. Patient with resultant dysarthria, expressive>receptive aphsia, left gaze preference, right hemiparesis with sensory deficits. Therapies initiated yesterday-diet recently advanced to dysphagia 1 honey liquids .latest cranial CT scan stable and Lovenox has been initiated for DVT prophylaxis as of  05/05/2013. Patient with acute respiratory distress 12/29 pm requiring BIPAP/CPAP suspect untreated obstructive sleep apnea and follow pulmonary services. MD, PT, ST recommending CIR.   Patient transferred to CIR on 05/06/2013 .   Patient currently requires total with mobility secondary to impaired timing and sequencing, unbalanced muscle activation, motor apraxia, decreased coordination and decreased motor planning and decreased initiation, decreased problem solving, decreased safety awareness and delayed processing.  Prior to hospitalization, patient was independent  with mobility and lived with Significant other (lives with GF x 11 years) in a House (rental x 11 yrs) home.  Home access is 2Stairs to enter.  Patient will benefit from skilled PT intervention to maximize safe functional mobility, minimize fall risk and decrease caregiver burden for planned discharge home with 24 hour assist.  Anticipate patient will benefit from follow up [facility] at discharge.  PT - End of Session Activity Tolerance: Tolerates 10 - 20 min activity with multiple rests Endurance Deficit: Yes Endurance Deficit Description: Pt with dyspnea 2/4 when sitting EOB and working on sitting balance. PT Assessment Rehab Potential: Good Barriers to Discharge: Inaccessible home environment PT Patient demonstrates impairments in the following area(s): Balance;Edema;Endurance;Motor;Pain;Perception;Safety;Sensory PT Transfers Functional Problem(s): Bed Mobility;Bed to Chair;Car;Furniture PT Locomotion Functional Problem(s): Ambulation;Wheelchair Mobility;Stairs PT Plan PT Intensity: Minimum of 1-2 x/day ,45 to 90 minutes PT Frequency: 5 out of 7 days PT Duration Estimated Length of Stay: 4 weeks PT Treatment/Interventions: Ambulation/gait training;Balance/vestibular training;Cognitive remediation/compensation;DME/adaptive equipment instruction;Functional mobility training;Neuromuscular re-education;Pain management;Patient/family  education;Stair training;Therapeutic Activities;Therapeutic Exercise;UE/LE Strength taining/ROM;UE/LE Coordination activities;Wheelchair propulsion/positioning PT Transfers Anticipated Outcome(s): min-assist PT Locomotion Anticipated Outcome(s): TBD after R knee painful effusion and R ankle pain resolve, may ultimately require w/c for safety and mobility PT Recommendation Follow Up Recommendations: Home health  PT Patient destination: Home  PT Evaluation Precautions/Restrictions Precautions Precautions: Fall Precaution Comments: R sided inattention,Dense Right hemiparesis,  pusher toward Right, global aphasia deficits Restrictions Weight Bearing Restrictions: No General Chart Reviewed: Yes Family/Caregiver Present: Yes (girlfriend Tana Conch Ragland present with her daughter Janace Hoard)  Vital SignsTherapy Vitals Pulse Rate: 75 Oxygen Therapy SpO2: 94 % O2 Device: None (Room air) Pain Pain Assessment Pain Assessment: No/denies pain Pain Score: 0-No pain Faces Pain Scale: Hurts little more Pain Type: Acute pain Pain Location: Leg Pain Orientation: Right Pain Intervention(s): Repositioned Multiple Pain Sites: No Home Living/Prior Functioning Home Living Available Help at Discharge: Available 24 hours/day;Family Mal Misty daughter of Tana Conch is a Quarry manager and will be available some at discharge) Type of Home: House (rental x 11 yrs) Home Access: Stairs to enter Technical brewer of Steps: 2 Entrance Stairs-Rails: Right;Left;Can reach both Home Layout: One level  Lives With: Significant other (lives with GF x 11 years) Prior Function Level of Independence: Independent with basic ADLs;Independent with gait;Independent with transfers  Able to Take Stairs?: Yes Driving: Yes Vocation: On disability (shoulder major rotator cuff tear and ACDF caused disability) Leisure: Hobbies-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  In one instance handed pt washcoth and told him to wash his face and he did.  Then when told to place the washcloth in the washpan he tossed it over in the floor. Sensation Sensation Light Touch: Impaired Detail Light Touch Impaired Details: Impaired RUE Stereognosis: Impaired Detail Stereognosis Impaired Details: Impaired RUE Hot/Cold: Impaired Detail Hot/Cold Impaired Details: Impaired RUE Proprioception: Impaired Detail Proprioception Impaired Details: Impaired RUE Additional Comments: Pt unable to detect light touch or pinch in the right arm.  Difficult to accurrately assess secondary to receptive difficulties. Coordination Gross Motor Movements are Fluid and Coordinated: No Fine Motor Movements are Fluid and Coordinated: No Coordination and Movement Description: Pt currenlty Brunnstrum I in the right arm and hand. Motor  Motor Motor: Hemiplegia;Abnormal postural alignment and control Motor - Skilled Clinical Observations: Pt with no attempted active movement in the RUE or LE to command or with functional ADL.  Sits in a posterior pelvic tilt with increased lean/pushing to the right side.    Mobility Bed Mobility Bed Mobility: Rolling Right;Rolling Left;Sit to Supine;Supine to Sit Rolling Right: 2: Max assist;With rail Rolling Right Details: Verbal cues for sequencing;Manual facilitation for placement Rolling Left: 1: +1 Total assist Rolling Left Details: Manual facilitation for  placement;Manual facilitation for weight shifting;Verbal cues for technique Supine to Sit: 1: +1 Total assist Supine to Sit Details: Manual facilitation for placement;Manual facilitation for weight shifting Supine to Sit Details (indicate cue type and reason): Pt needed assistance with moving the RLE over to the edge of the bed and also to bring trunk into sitting position. Sit to Supine: 1: +1 Total assist Sit to Supine - Details: Manual facilitation for weight bearing;Manual facilitation for weight shifting;Tactile cues for weight shifting Locomotion  Ambulation Ambulation: No Ambulation/Gait Assistance: Not tested (comment) Gait Gait: No Stairs / Additional Locomotion Stairs: No Wheelchair Mobility Wheelchair Mobility: No  Trunk/Postural Assessment  Cervical Assessment Cervical Assessment: Within Functional  Limits Thoracic Assessment Thoracic Assessment: Within Functional Limits Lumbar Assessment Lumbar Assessment: Within Functional Limits Postural Control Postural Control: Deficits on evaluation Trunk Control: impaired midline orientation, tends to push with L UE with trunk lean backward to the Left. Righting Reactions: Pt with delay reactions with LOB to the right.  Pt reports feeling he is falling to the left even though he is severly leaning to the right side.  Balance Balance Balance Assessed: Yes Static Sitting Balance Static Sitting - Balance Support: Left upper extremity supported;Feet supported Static Sitting - Level of Assistance: 1: +1 Total assist Static Sitting - Comment/# of Minutes: Pt pushing heavily to the right side in sitting. Dynamic Sitting Balance Dynamic Sitting - Balance Support: Left upper extremity supported;Feet supported Dynamic Sitting - Level of Assistance: 1: +1 Total assist Extremity Assessment  RUE Assessment RUE Assessment: Exceptions to [facility] RUE Strength RUE Overall Strength Comments: Pt with PROM WFLs for all joints of the RUE.  No active  movement noted to command or with functional use.  Pt currently Brunnstrom stage I movement in the RUE. LUE Assessment LUE Assessment: Within Functional Limits RLE Assessment RLE Assessment: Exceptions to [employer] (large R knee joint efusion, PROM 90, MMT: 0/5) LLE Assessment LLE Assessment: Within Functional Limits  FIM:  FIM - Bed/Chair Transfer Bed/Chair Transfer: 1: Supine > Sit: Total A (helper does all/Pt. < 25%);1: Sit > Supine: Total A (helper does all/Pt. < 25%);1: Bed > Chair or W/C: Total A (helper does all/Pt. < 25%);1: Chair or W/C > Bed: Total A (helper does all/Pt. < 25%) FIM - Locomotion: Wheelchair Locomotion: Wheelchair: 0: Activity did not occur FIM - Locomotion: Ambulation Ambulation/Gait Assistance: Not tested (comment) FIM - Locomotion: Stairs Locomotion: Stairs: 0: Activity did not occur   Refer to Care Plan for Long Term Goals  Recommendations for other services: None  Discharge Criteria: Patient will be discharged from PT if patient refuses treatment 3 consecutive times without medical reason, if treatment goals not met, if there is a change in medical status, if patient makes no progress towards goals or if patient is discharged from hospital.  The above assessment, treatment plan, treatment alternatives and goals were discussed and mutually agreed upon: by patient and by family  Clearence Ped 05/07/2013, 5:00 PM

## 2013-05-07 NOTE — Plan of Care (Signed)
Problem: RH BLADDER ELIMINATION Goal: RH STG MANAGE BLADDER WITH ASSISTANCE STG Manage Bladder With mod Assistance  Outcome: Not Progressing Wears condom cath. Total incont. Bed changed x1

## 2013-05-08 ENCOUNTER — Inpatient Hospital Stay (HOSPITAL_COMMUNITY): Payer: Medicare Other | Admitting: Occupational Therapy

## 2013-05-08 MED ORDER — LABETALOL HCL 200 MG PO TABS
200.0000 mg | ORAL_TABLET | Freq: Two times a day (BID) | ORAL | Status: DC
  Administered 2013-05-08 – 2013-05-09 (×3): 200 mg via ORAL
  Filled 2013-05-08 (×7): qty 1

## 2013-05-08 MED ORDER — STARCH (THICKENING) PO POWD
ORAL | Status: DC | PRN
  Filled 2013-05-08 (×2): qty 227

## 2013-05-08 NOTE — Progress Notes (Signed)
Occupational Therapy Session Note  Patient Details  Name: [name] MRN: [id number] Date of Birth: March 11, 1951  Today's Date: 05/08/2013 Time: 0930-1030 Time Calculation (min): 60 min  Skilled Therapeutic Interventions/Progress Updates: ADL in bed today.   Patient appeared with global aphasia but was able to follow command to wash face and reach for head board to help pull up self in bed.  ADL completed in bed with overall Max to Total A.  He required Max A x2 to complete lateral rolls to his left.  Per staff report patient has exhibited pusher syndrome and cognitive inconsistency re: ability to follow commands and on yesterday the suggested safest transfer method was with mechanical lift.  Patient was positioned on his right side to allow pressure relief off his slightly reddened buttocks.    Therapy Documentation Precautions:  Precautions Precautions: Fall Precaution Comments: R sided inattention,Dense Right hemiparesis,  pusher toward Right, global aphasia deficits Restrictions Weight Bearing Restrictions: No  Pain: Patient winced when his sore R LE was moved but he was unable to report pain level   See FIM for current functional status  Therapy/Group: Individual Therapy  Bud Faceickett, Lola C[facility] 05/08/2013, 4:13 PM

## 2013-05-08 NOTE — Progress Notes (Signed)
Patient ID: HENDRICK PAVICH, male   DOB: 01-Feb-1951, 63 y.o.   MRN: [id number]  Patient ID: BENEN WEIDA, male   DOB: [birth year], 63 y.o.   MRN: [id number]  2/5.   63 year old patient who was admitted to the rehabilitation service 2 days ago  for further treatment following a left parietal ICH. Patient has a history of hypertension that has not been well controlled since his admission. Labetalol 100 mg twice daily added to his regimen Yesterday the patient noted increased pain and swelling involving his right knee; physical therapy was placed on hold.  Radiographs were obtained that revealed no bony abnormality. No history of gout or falls.  Blood pressure remains elevated in spite of labetalol 100 mg twice a day added to his regimen  Physical Medicine and Rehabilitation Admission H&P  Chief Complaint   Patient presents with   .  Cerebrovascular Accident   :  Chief complaint: Weakness  HPI: REYNALDO ROSSMAN is a 63 y.o. male with history of HTN--no medication; who was found down on floor with inability to speak or move right side. 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 BP elevated at 247/132 and patient started on cardene drip for control--NIH stroke score-22. 2 D echo with EF 60-65%, mild MR, mod LA dilation. Carotid dopplers without significant ICA stenosis. Patient with resultant dysarthria, expressive>receptive aphsia, left gaze preference, right hemiparesis with sensory deficits. Therapies initiated yesterday-diet recently advanced to dysphagia 1 honey liquids .latest cranial CT scan stable and Lovenox has been initiated for DVT prophylaxis as of 05/05/2013. Patient with acute respiratory distress 12/29 pm requiring BIPAP/CPAP suspect untreated obstructive sleep apnea and follow  pulmonary services. MD, PT, ST recommending CIR. patient was felt to be a good candidate for inpatient rehabilitation services and was admitted for comprehensive rehabilitation program  Aphasic. Mutters occ words  ROS Review of Systems  Unable to perform ROS: language  Past Medical History   Diagnosis  Date   .  Hypertension    History reviewed. No pertinent past surgical history.  History reviewed. No pertinent family history.  Social History: reports that he has been smoking. He does not have any smokeless tobacco history on file. His alcohol and drug histories are not on file.  Allergies: No Known Allergies  No prescriptions prior to admission     Physical Exam:  Blood pressure 174/97, pulse 78, temperature 98.6 F (37 C), temperature source Oral, resp. rate 24, height _0  (1.778 m), weight 88.2 kg (194 lb 7.1 oz), SpO2 98.00%.  Physical Exam  Constitutional: He appears well-developed and well-nourished.  HENT: Slightly dry mucosal membranes Head: Normocephalic and atraumatic.  Eyes: Conjunctivae are normal. Pupils are equal, round, and reactive to light.  Neck: Normal range of motion. Neck supple.  Cardiovascular: Regular rhythm.   Respiratory: Effort normal and breath sounds normal. No respiratory distress. He has no wheezes.  Extremities- prominent effusion right knee but no significant tenderness erythema or excessive warmth to touch GI: Soft. Bowel sounds are normal. He exhibits no distension. There is no tenderness.  Neurological: He is alert. Dense right hemiparesis. Little verbal response  Skin: Skin is warm and dry ; SCDs in place    Intake/Output Summary (Last 24 hours) at 05/08/13 1020 Last data filed at 05/08/13 [phone]  Gross per 24 hour  Intake    120 ml  Output    950 ml  Net   -830 ml   Patient Vitals for the past 24 hrs:  BP Temp Temp src Pulse Resp SpO2  05/08/13 0825 180/98 mmHg - - - - -  05/08/13 0628 182/92 mmHg - - - - -  05/08/13 0615 191/114 mmHg  99.9 F (37.7 C) Oral 79 20 94 %  05/07/13 2049 183/105 mmHg - - 79 - -  05/07/13 1523 170/90 mmHg - - - - -  05/07/13 1518 175/110 mmHg 98.2 F (36.8 C) Oral 77 19 95 %     Results for orders placed during the hospital encounter of 05/01/13 (from the past 48 hour(s))   GLUCOSE, CAPILLARY Status: Abnormal    Collection Time    05/04/13 11:56 AM   Result  Value  Range    Glucose-Capillary  127 (*)  70 - 99 mg/dL   GLUCOSE, CAPILLARY Status: Abnormal    Collection Time    05/04/13 3:54 PM   Result  Value  Range    Glucose-Capillary  137 (*)  70 - 99 mg/dL   BASIC METABOLIC PANEL Status: Abnormal    Collection Time    05/05/13 4:53 AM   Result  Value  Range    Sodium  141  137 - 147 mEq/L    Comment:  Please note change in reference range.    Potassium  3.5 (*)  3.7 - 5.3 mEq/L    Comment:  Please note change in reference range.    Chloride  102  96 - 112 mEq/L    CO2  20  19 - 32 mEq/L    Glucose, Bld  100 (*)  70 - 99 mg/dL    BUN  19  6 - 23 mg/dL    Creatinine, Ser  0.93  0.50 - 1.35 mg/dL    Calcium  9.0  8.4 - 10.5 mg/dL    GFR calc non Af Amer  88 (*)  >90 mL/min    GFR calc Af Amer  >90  >90 mL/min    Comment:  (NOTE)     The eGFR has been calculated using the CKD EPI equation.     This calculation has not been validated in all clinical situations.     eGFR's persistently <90 mL/min signify possible Chronic Kidney     Disease.   BASIC METABOLIC PANEL Status: Abnormal    Collection Time    05/06/13 4:40 AM   Result  Value  Range    Sodium  142  137 - 147 mEq/L    Comment:  Please note change in reference range.    Potassium  3.9  3.7 - 5.3 mEq/L    Comment:  Please note change in reference range.    Chloride  104  96 - 112 mEq/L    CO2  24  19 - 32 mEq/L    Glucose, Bld  110 (*)  70 - 99 mg/dL    BUN  25 (*)  6 - 23 mg/dL    Creatinine, Ser  0.99  0.50 - 1.35 mg/dL    Calcium  9.4  8.4 - 10.5 mg/dL    GFR calc non Af Amer  86 (*)  >90 mL/min    GFR calc Af  Amer  >90  >90 mL/min    Comment:  (NOTE)     The eGFR has been calculated using the  CKD EPI equation.     This calculation has not been validated in all clinical situations.     eGFR's persistently <90 mL/min signify possible Chronic Kidney     Disease.   CBC Status: Abnormal    Collection Time    05/06/13 4:40 AM   Result  Value  Range    WBC  10.9 (*)  4.0 - 10.5 K/uL    RBC  4.98  4.22 - 5.81 MIL/uL    Hemoglobin  15.5  13.0 - 17.0 g/dL    HCT  44.1  39.0 - 52.0 %    MCV  88.6  78.0 - 100.0 fL    MCH  31.1  26.0 - 34.0 pg    MCHC  35.1  30.0 - 36.0 g/dL    RDW  12.8  11.5 - 15.5 %    Platelets  169  150 - 400 K/uL   Dg Chest Port 1 View  05/05/2013 CLINICAL DATA: Shortness of breath. EXAM: PORTABLE CHEST - 1 VIEW COMPARISON: 05/02/2013 FINDINGS: Cardiomegaly is stable. Mild increase in diffuse interstitial infiltrates is suspicious for mild interstitial edema. No evidence of pulmonary consolidation or pleural effusion. IMPRESSION: Increased mild diffuse interstitial infiltrates, suspicious for interstitial edema. Stable cardiomegaly. Electronically Signed By: Earle Gell M.D. On: 05/05/2013 20:20  Dg Swallowing Func-speech Pathology  05/04/2013 Orbie Pyo [place], CCC-SLP 05/04/2013 11:58 AM Objective Swallowing Evaluation: Modified Barium Swallowing Study Patient Details Name: DERAN BARRO MRN: [id number] Date of Birth: [birth year] Today's Date: 05/04/2013 Time: 4174-0814 SLP Time Calculation (min): 20 min Past Medical History: Past Medical History Diagnosis Date . Hypertension Past Surgical History: History reviewed. No pertinent past surgical history. HPI: 63 y/o male with history of HTN found on floor of his home early in AM 05/01/13 with speech difficulty and inability to move his right side. CT scan showed 8cm left parietal hemorrhage with extension into left lateral ventricle. Mild mass effect as well as mild vasogenic edema. Pt. adequately alert and able to participate in MBS.  Assessment / Plan / Recommendation Clinical Impression Dysphagia Diagnosis: Moderate oral phase dysphagia;Moderate pharyngeal phase dysphagia;Severe oral phase dysphagia Clinical impression: Pt. exhibited moderate-severe oral dysphagia with right poor cohesion, manipulation and sensation leading to right buccal cavity pocketing, leakage and delayed transit. Pharyngeal phase dysphagia is primarily sensory based with a mild motor component and delayed swallow to lower pharynx ranging from 5-20 seconds. No significant difference between nectar and honey thick pharygneal residue, in addition, honey barium provided for a slower and more cohesive entrance to pyriform sinuses. Laryngeal penetration observed with thin (reflexive cough), nectar and honey (flash). Pt. required max verbal and (intermittent) tactile cues to initiate second swallow (dry spoon). Pt. continues with a moderately high aspiration risk, however Dys 1 diet texture and honey thick liquids, full supervision and pills crushed recommended to decrease risk. ST services to continue on acute care. Treatment Recommendation Therapy as outlined in treatment plan below Diet Recommendation Dysphagia 1 (Puree);Honey-thick liquid Liquid Administration via: Cup;No straw Medication Administration: Crushed with puree Supervision: Full supervision/cueing for compensatory strategies;Staff to assist with self feeding Compensations: Slow rate;Small sips/bites;Clear throat intermittently (allow time for first swallow) Postural Changes and/or Swallow Maneuvers: Seated upright 90 degrees;Upright 30-60 min after meal Other Recommendations Oral Care Recommendations: Oral care Q4 per protocol Other Recommendations: Order thickener from pharmacy Follow Up Recommendations Inpatient Rehab Frequency and Duration min 2x/week 2 weeks Pertinent Vitals/Pain WDL Reason for Referral Objectively evaluate swallowing function Oral Phase Oral Preparation/Oral Phase Oral  Phase: Impaired Oral -  Honey Oral - Honey Teaspoon: Right anterior bolus loss;Weak lingual manipulation;Piecemeal swallowing;Right pocketing in lateral sulci;Delayed oral transit Oral - Honey Cup: Right anterior bolus loss;Weak lingual manipulation;Piecemeal swallowing;Right pocketing in lateral sulci;Delayed oral transit Oral - Nectar Oral - Nectar Teaspoon: Right anterior bolus loss;Weak lingual manipulation;Piecemeal swallowing;Right pocketing in lateral sulci;Delayed oral transit Oral - Nectar Cup: Right anterior bolus loss;Weak lingual manipulation;Piecemeal swallowing;Right pocketing in lateral sulci;Delayed oral transit Oral - Thin Oral - Thin Teaspoon: Right anterior bolus loss;Weak lingual manipulation;Piecemeal swallowing;Right pocketing in lateral sulci;Delayed oral transit Oral - Solids Oral - Puree: Delayed oral transit;Right pocketing in lateral sulci Pharyngeal Phase Pharyngeal Phase Pharyngeal Phase: Impaired Pharyngeal - Honey Pharyngeal - Honey Teaspoon: Pharyngeal residue - pyriform sinuses;Pharyngeal residue - valleculae;Premature spillage to valleculae;Within functional limits;Delayed swallow initiation Pharyngeal - Honey Cup: Delayed swallow initiation;Premature spillage to valleculae;Premature spillage to pyriform sinuses;Pharyngeal residue - valleculae;Pharyngeal residue - pyriform sinuses;Penetration/Aspiration during swallow Penetration/Aspiration details (honey cup): Material enters airway, remains ABOVE vocal cords then ejected out Pharyngeal - Nectar Pharyngeal - Nectar Teaspoon: Delayed swallow initiation;Premature spillage to pyriform sinuses;Pharyngeal residue - valleculae;Pharyngeal residue - pyriform sinuses;Reduced tongue base retraction;Reduced laryngeal elevation;Penetration/Aspiration during swallow Penetration/Aspiration details (nectar teaspoon): Material enters airway, remains ABOVE vocal cords then ejected out Pharyngeal - Nectar Cup: Delayed swallow initiation;Premature spillage to pyriform  sinuses;Penetration/Aspiration during swallow (deeper penetration) Penetration/Aspiration details (nectar cup): Material enters airway, CONTACTS cords then ejected out Pharyngeal - Thin Pharyngeal - Thin Teaspoon: Delayed swallow initiation;Premature spillage to valleculae;Premature spillage to pyriform sinuses;Penetration/Aspiration during swallow (from pyriform sinus residue) Penetration/Aspiration details (thin teaspoon): Material enters airway, CONTACTS cords then ejected out Pharyngeal - Solids Pharyngeal - Puree: Delayed swallow initiation;Pharyngeal residue - valleculae;Reduced tongue base retraction Cervical Esophageal Phase GO Cervical Esophageal Phase Cervical Esophageal Phase: Darryll Capers [place] Mon.Shonna Chock Pager [phone] 05/04/2013   Medical Problem List and Plan:  1. 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 DVT Prophylaxis/Anticoagulation: Subcutaneous Lovenox initiated 05/05/2013. Monitor platelet counts any signs of bleeding;   Continue SCDs 3. Hypertension. Suboptimal control . Norvasc 10 mg daily, hydrochlorothiazide 25 mg daily.  Labetolol 100 mg twice daily added to his regimen 2 days ago. We'll uptitrate to 200 mg twice daily and continue to follow 4.  right knee effusion. Unclear etiology.  Does not appear to be a septic knee.  We'll challenge with physical therapy today. Consider orthopedic evaluation tomorrow for arthrocentesis

## 2013-05-09 ENCOUNTER — Inpatient Hospital Stay (HOSPITAL_COMMUNITY): Payer: Medicare Other | Admitting: Rehabilitation

## 2013-05-09 ENCOUNTER — Inpatient Hospital Stay (HOSPITAL_COMMUNITY): Payer: Medicare Other | Admitting: Speech Pathology

## 2013-05-09 ENCOUNTER — Encounter (HOSPITAL_COMMUNITY): Payer: Medicare Other | Admitting: Occupational Therapy

## 2013-05-09 DIAGNOSIS — I619 Nontraumatic intracerebral hemorrhage, unspecified: Secondary | ICD-10-CM

## 2013-05-09 DIAGNOSIS — I69920 Aphasia following unspecified cerebrovascular disease: Secondary | ICD-10-CM

## 2013-05-09 DIAGNOSIS — G8110 Spastic hemiplegia affecting unspecified side: Secondary | ICD-10-CM

## 2013-05-09 LAB — COMPREHENSIVE METABOLIC PANEL
ALK PHOS: 95 U/L (ref 39–117)
ALT: 70 U/L — ABNORMAL HIGH (ref 0–53)
AST: 33 U/L (ref 0–37)
Albumin: 2.8 g/dL — ABNORMAL LOW (ref 3.5–5.2)
BILIRUBIN TOTAL: 1.1 mg/dL (ref 0.3–1.2)
BUN: 40 mg/dL — AB (ref 6–23)
CHLORIDE: 107 meq/L (ref 96–112)
CO2: 23 meq/L (ref 19–32)
CREATININE: 1.26 mg/dL (ref 0.50–1.35)
Calcium: 9.7 mg/dL (ref 8.4–10.5)
GFR calc Af Amer: 69 mL/min — ABNORMAL LOW (ref >90)
GFR calc non Af Amer: 59 mL/min — ABNORMAL LOW (ref >90)
Glucose, Bld: 115 mg/dL — ABNORMAL HIGH (ref 70–99)
Potassium: 3.9 mEq/L (ref 3.7–5.3)
Sodium: 148 mEq/L — ABNORMAL HIGH (ref 137–147)
Total Protein: 7.8 g/dL (ref 6.0–8.3)

## 2013-05-09 LAB — CBC WITH DIFFERENTIAL/PLATELET
Basophils Absolute: 0 10*3/uL (ref 0.0–0.1)
Basophils Relative: 0 % (ref 0–1)
Eosinophils Absolute: 0.1 10*3/uL (ref 0.0–0.7)
Eosinophils Relative: 1 % (ref 0–5)
HCT: 48.3 % (ref 39.0–52.0)
HEMOGLOBIN: 16.3 g/dL (ref 13.0–17.0)
LYMPHS PCT: 10 % — AB (ref 12–46)
Lymphs Abs: 0.9 10*3/uL (ref 0.7–4.0)
MCH: 29.9 pg (ref 26.0–34.0)
MCHC: 33.7 g/dL (ref 30.0–36.0)
MCV: 88.6 fL (ref 78.0–100.0)
MONOS PCT: 10 % (ref 3–12)
Monocytes Absolute: 0.9 10*3/uL (ref 0.1–1.0)
NEUTROS ABS: 7 10*3/uL (ref 1.7–7.7)
Neutrophils Relative %: 79 % — ABNORMAL HIGH (ref 43–77)
Platelets: 231 10*3/uL (ref 150–400)
RBC: 5.45 MIL/uL (ref 4.22–5.81)
RDW: 12.7 % (ref 11.5–15.5)
WBC: 8.9 10*3/uL (ref 4.0–10.5)

## 2013-05-09 MED ORDER — SODIUM CHLORIDE 0.45 % IV SOLN
INTRAVENOUS | Status: DC
  Administered 2013-05-09 – 2013-05-10 (×2): via INTRAVENOUS
  Administered 2013-05-11: 1000 mL via INTRAVENOUS
  Administered 2013-05-14 – 2013-05-15 (×2): 75 mL/h via INTRAVENOUS
  Administered 2013-05-16 – 2013-05-28 (×7): via INTRAVENOUS
  Administered 2013-05-29: 75 mL/h via INTRAVENOUS
  Administered 2013-05-30 – 2013-06-01 (×3): via INTRAVENOUS

## 2013-05-09 NOTE — Progress Notes (Signed)
Pt continues to grimace, moan when RLE repositioned; knee swollen,ankle sl.swollen.  Vicodin given x 2  today, ice applied PRN. Incontinent urine x 2; large amt at 1700, scanned for 210cc, monitor, continue PVRs.  Pt. follows one-step commands inconsistently; irritable affect;  more cooperative  this eve.  Sclera red bilaterally.

## 2013-05-09 NOTE — Progress Notes (Signed)
Subjective/Complaints: Aphasic not able to voice c/os Review of Systems - not able to obtain secondary to aphasia Objective: Vital Signs: Blood pressure 176/103, pulse 87, temperature 99.6 F (37.6 C), temperature source Oral, resp. rate 18, height 6' 5" (1.956 m), weight 83.462 kg (184 lb), SpO2 97.00%. Dg Knee Right Port  05/07/2013   CLINICAL DATA:  Swollen right knee.  No known injury.  EXAM: PORTABLE RIGHT KNEE - 1-2 VIEW  COMPARISON:  None.  FINDINGS: There is a large suprapatellar joint effusion No foreign bodies are identified. There is no evidence of acute fracture, dislocation or bone destruction. Minimal patellofemoral spurring is noted.  IMPRESSION: Large knee joint effusion.  No acute osseous findings.   Electronically Signed   By: Bill  Veazey M.D.   On: 05/07/2013 21:01   Results for orders placed during the hospital encounter of 05/06/13 (from the past 72 hour(s))  CBC     Status: None   Collection Time    05/06/13  8:20 PM      Result Value Range   WBC 9.4  4.0 - 10.5 K/uL   RBC 5.11  4.22 - 5.81 MIL/uL   Hemoglobin 14.8  13.0 - 17.0 g/dL   HCT 44.6  39.0 - 52.0 %   MCV 87.3  78.0 - 100.0 fL   MCH 29.0  26.0 - 34.0 pg   MCHC 33.2  30.0 - 36.0 g/dL   RDW 12.6  11.5 - 15.5 %   Platelets 199  150 - 400 K/uL  CREATININE, SERUM     Status: Abnormal   Collection Time    05/06/13  8:20 PM      Result Value Range   Creatinine, Ser 1.05  0.50 - 1.35 mg/dL   GFR calc non Af Amer 74 (*) >90 mL/min   GFR calc Af Amer 86 (*) >90 mL/min   Comment: (NOTE)     The eGFR has been calculated using the CKD EPI equation.     This calculation has not been validated in all clinical situations.     eGFR's persistently <90 mL/min signify possible Chronic Kidney     Disease.  CBC WITH DIFFERENTIAL     Status: Abnormal   Collection Time    05/07/13  3:45 PM      Result Value Range   WBC 10.9 (*) 4.0 - 10.5 K/uL   RBC 5.05  4.22 - 5.81 MIL/uL   Hemoglobin 15.6  13.0 - 17.0 g/dL   HCT  45.0  39.0 - 52.0 %   MCV 89.1  78.0 - 100.0 fL   MCH 30.9  26.0 - 34.0 pg   MCHC 34.7  30.0 - 36.0 g/dL   RDW 12.8  11.5 - 15.5 %   Platelets 219  150 - 400 K/uL   Neutrophils Relative % 78 (*) 43 - 77 %   Neutro Abs 8.5 (*) 1.7 - 7.7 K/uL   Lymphocytes Relative 9 (*) 12 - 46 %   Lymphs Abs 1.0  0.7 - 4.0 K/uL   Monocytes Relative 12  3 - 12 %   Monocytes Absolute 1.3 (*) 0.1 - 1.0 K/uL   Eosinophils Relative 1  0 - 5 %   Eosinophils Absolute 0.1  0.0 - 0.7 K/uL   Basophils Relative 0  0 - 1 %   Basophils Absolute 0.0  0.0 - 0.1 K/uL  CBC WITH DIFFERENTIAL     Status: Abnormal   Collection Time    05/09/13    6:20 AM      Result Value Range   WBC 8.9  4.0 - 10.5 K/uL   RBC 5.45  4.22 - 5.81 MIL/uL   Hemoglobin 16.3  13.0 - 17.0 g/dL   HCT 48.3  39.0 - 52.0 %   MCV 88.6  78.0 - 100.0 fL   MCH 29.9  26.0 - 34.0 pg   MCHC 33.7  30.0 - 36.0 g/dL   RDW 12.7  11.5 - 15.5 %   Platelets 231  150 - 400 K/uL   Neutrophils Relative % 79 (*) 43 - 77 %   Neutro Abs 7.0  1.7 - 7.7 K/uL   Lymphocytes Relative 10 (*) 12 - 46 %   Lymphs Abs 0.9  0.7 - 4.0 K/uL   Monocytes Relative 10  3 - 12 %   Monocytes Absolute 0.9  0.1 - 1.0 K/uL   Eosinophils Relative 1  0 - 5 %   Eosinophils Absolute 0.1  0.0 - 0.7 K/uL   Basophils Relative 0  0 - 1 %   Basophils Absolute 0.0  0.0 - 0.1 K/uL      Constitutional: He appears well-developed and well-nourished.  HENT:  Head: Normocephalic and atraumatic.  Eyes: Conjunctivae are normal. Pupils are equal, round, and reactive to light.  Neck: Normal range of motion. Neck supple.  Cardiovascular: Regular rhythm. Tachycardia present.  Respiratory: Effort normal and breath sounds normal. No respiratory distress. He has no wheezes.   GI: Soft. Bowel sounds are normal. He exhibits no distension. There is no tenderness.  Neurological: He is alert.   Flat affect with delayed processing. Restless and distracted. Minimal verbal output. Dense right hemiparesis  with sensory deficits. Moves LUE/LLE with visual/verbal cues. Unable to follow simple commands. RUE and RLE are 0/5. Does seem to have some gross pain sense in the right leg. Able to only state name, moderate dysarthria Skin: Skin is warm and dry    Assessment/Plan: 1. 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 Physiatrist is providing close team supervision and 24 hour management of active medical problems listed below. Physiatrist and rehab team continue to assess barriers to discharge/monitor patient progress toward functional and medical goals. FIM: FIM - Bathing Bathing Steps Patient Completed: Chest;Abdomen;Left upper leg Bathing: 1: Two helpers  FIM - Upper Body Dressing/Undressing Upper body dressing/undressing: 0: Wears gown/pajamas-no public clothing FIM - Lower Body Dressing/Undressing Lower body dressing/undressing: 1: Total-Patient completed less than 25% of tasks     FIM - Toilet Transfers Toilet Transfers: 0-Activity did not occur  FIM - Bed/Chair Transfer Bed/Chair Transfer: 1: Supine > Sit: Total A (helper does all/Pt. < 25%);1: Sit > Supine: Total A (helper does all/Pt. < 25%);1: Bed > Chair or W/C: Total A (helper does all/Pt. < 25%);1: Chair or W/C > Bed: Total A (helper does all/Pt. < 25%)  FIM - Locomotion: Wheelchair Locomotion: Wheelchair: 0: Activity did not occur FIM - Locomotion: Ambulation Ambulation/Gait Assistance: Not tested (comment)  Comprehension Comprehension Mode: Auditory Comprehension: 2-Understands basic 25 - 49% of the time/requires cueing 51 - 75% of the time  Expression Expression Mode: Nonverbal Expression: 2-Expresses basic 25 - 49% of the time/requires cueing 50 - 75% of the time. Uses single words/gestures.  Social Interaction Social Interaction: 2-Interacts appropriately 25 - 49%  of time - Needs frequent redirection.  Problem Solving Problem Solving: 2-Solves basic 25 -   49% of the time - needs direction more than half the time to initiate, plan or complete simple activities  Memory Memory: 1-Recognizes or recalls less than 25% of the time/requires cueing greater than 75% of the time  Medical Problem List and Plan:  1. 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 DVT Prophylaxis/Anticoagulation: Subcutaneous Lovenox initiated 05/05/2013. Monitor platelet counts any signs of bleeding  3. Pain Management: Tylenol as needed  4. Neuropsych: This patient is not capable of making decisions on his own behalf.  5. Dysphagia. Dysphagia 1 honey thick liquids. Monitor closely for any aspiration. Followup speech therapy  6. Hypertension.Uncontrolled with tachycardia Norvasc 10 mg daily, hydrochlorothiazide 25 mg daily. Monitor with increased mobility, titrate BB 7. OSA. CPAP. Will follow pulmonary services as needed    LOS (Days) 3 A FACE TO FACE EVALUATION WAS PERFORMED  KIRSTEINS,ANDREW E 05/09/2013, 7:12 AM

## 2013-05-09 NOTE — Progress Notes (Addendum)
Physical Therapy Session Note  Patient Details  Name: [name] MRN: [id number] Date of Birth: 07/23/1950  Today's Date: 05/09/2013 Time: [id number] Time Calculation (min): 10 min  Short Term Goals: Week 1:  PT Short Term Goal 1 (Week 1): Patient will be able to perform bed mobility with Mod-assist. PT Short Term Goal 2 (Week 1): Patient will be able to perform transfers with Max-assist. PT Short Term Goal 3 (Week 1): Patient will be able to perform dynamic sitting balance with Mod-assist. PT Short Term Goal 4 (Week 1): Patient will be able to propel w/c 2850' with Mod-assist.  Skilled Therapeutic Interventions/Progress Updates:   Pt received lying in bed this morning.  Attempted to assist pt out of bed this am, however pt resisting all attempted movement provided from therapist.  Note he also kept stating "no."  Attempted to have pt participate in cognitive task of distinguishing between items in a field of two, with pt able to correctly grab item 50% of time.  Also provided pt with wash cloth with instruction to "wash face."  He initially resisted min hand over hand assist, however pt then able to wash face on his own.  Note he initially did not attend to R side of face, however on second rep, he did wash both sides of face.  Continued to provide max encouragement for getting OOB, benefits of therapy, etc, however pt continued to shake head "no" and repeat "no."  Provided pt with w/c cushion, as this therapist noted he did not have one in room.  Left pt in bed with 3 siderails and bed alarm set.  All needs in reach.  RN made aware.    Therapy Documentation Precautions:  Precautions Precautions: Fall Precaution Comments: R sided inattention,Dense Right hemiparesis,  pusher toward Right, global aphasia deficits Restrictions Weight Bearing Restrictions: No General: Amount of Missed PT Time (min): 50 Minutes Missed Time Reason: Patient unwilling/refused to participate without medical  reason Vital Signs:   Pain: Pt nodded "yes" when asked and therapist pointed to pain in knee/ R leg.  RN provided pain meds this am.   See FIM for current functional status  Therapy/Group: Individual Therapy  Vista Deckarcell, Aleric Froelich Ann 05/09/2013, 9:18 AM

## 2013-05-09 NOTE — Progress Notes (Signed)
Social Work Assessment and Plan Social Work Assessment and Plan  Patient Details  Name: [name] MRN: [id number] Date of Birth: Feb 20, 1951  Today's Date: 05/09/2013  Problem List:  Patient Active Problem List   Diagnosis Date Noted  . probable undiagnosed OSA on CPAP 05/06/2013  . Other and unspecified hyperlipidemia 05/06/2013  . Hypokalemia 05/06/2013  . Chews tobacco 05/06/2013  . Dysphagia, secondary to stroke 05/06/2013  . Malignant hypertension 05/02/2013  . Acute respiratory distress 05/02/2013  . Tobacco abuse 05/02/2013  . 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 Hypertension    Past Surgical History: No past surgical history on file. Social History:  reports that he has been smoking.  He does not have any smokeless tobacco history on file. His alcohol and drug histories are not on file.  Family / Support Systems Marital Status: Widow/Widower Patient Roles: Partner Spouse/Significant Other: [name] Ragland-Significant Other   [phone]-home  [phone] Other Supports: Evangeline Gula daughter  [phone]-home  [phone]-cell Anticipated Caregiver: Do not have 24 hr care Ability/Limitations of Caregiver: Annamaria Boots has a non w/c accessible home and she has history of back and neck issues and can not provide physical care.  Clydie Braun is a Lawyer but wroks Sports administrator Availability: Other (Comment) (Can be there but not provide care) Family Dynamics: [name] and pt have been together for 11 years and care about one another.  She is not able to provide care due to her own health issues.  He has a sister and brother in [place] who have come to visit him since this happened.  [name] reports: " It is up to them. I 'm not married to him."  Social History Preferred language: English Religion:  Cultural Background: No issues Education: High School Read: Yes Write:  Yes Employment Status: Disabled Fish farm manager Issues: No issues Guardian/Conservator: None-according to MD pt is not capable of making his own decisions at this time.  [name] reports: ": His borhter and sister have to make these decisions I ain't married to him."   Abuse/Neglect Physical Abuse: Denies Verbal Abuse: Denies Sexual Abuse: Denies Exploitation of patient/patient's resources: Denies Self-Neglect: Denies  Emotional Status Pt's affect, behavior adn adjustment status: Pt is participating in therapies, he is not able to communicate due to his stroke and his aphasia.  [name] reports he has always been independent and active, never one to sit still.  She hopes he will recover but realizes he is in a bad way from this stroke.  Will assess when pt able. Recent Psychosocial Issues: Other health issues-stopped taking meds last spring.  Unsure reason why-[name] reports Pyschiatric History: No history deferred depression screen due to pt's inability to communicate at this time.  Will monitor and evaluate once able too.  Also monitor through team. Substance Abuse History: Tobacco was smoking, unsure if plans to quit  Patient / Family Perceptions, Expectations & Goals Pt/Family understanding of illness & functional limitations: Annamaria Boots can explain his deficits, she has had other family members have strokes and she has been their caregivers when she was younger and healthy. She visits every other day due to the distance but wants the best for him and will do what she can for him. Premorbid pt/family roles/activities: Brother, Boyfriend, Retiree, Fixed lawnmovers, friend, etc Anticipated changes in roles/activities/participation: resume Pt/family expectations/goals: [name] states: " I know he is in a bad way, this was bad.  I will do  what I can but can't physically help him."  " My house is small and will not fit a wheelchair."  [employer] Community Agencies: None Premorbid Home  Care/DME Agencies: None Transportation available at discharge: Family Resource referrals recommended: Support group (specify) ([facility] Support Group)  Discharge Planning Living Arrangements: Spouse/significant other Support Systems: Spouse/significant other;Other relatives;Friends/neighbors Type of Residence: Private residence Insurance Resources: Medicare;Medicaid (specify county) Scientist, forensic([place]) Surveyor, quantityinancial Resources: SSD;Other (Comment) (Lawnmower buisness) Financial Screen Referred: No Living Expenses: Other (Comment) (Lives in [name]'s home) Money Management: Patient Does the patient have any problems obtaining your medications?: No Home Management: [name], 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  Have discussed NHP but [name] reports his siblings need to make that decision.  Will try to obtian the siblings phone number and [place] will look for it for worker.  Will meet with on Wed after team conf. Social Work Anticipated Follow Up Needs: SNF;Support Group  Clinical Impression Very unfortunate gentleman who has had a severe stroke.  Annamaria BootsOpal is supportive but can not provide any physical care which pt will need at discharge.  Will try to contact his siblings and see what their Questions, concerns and to work on a discharge plan.  Will also include pt and work on a safe feasible discharge plan.  Lucy Chrisupree, Dynasti Kerman G 05/09/2013, 3:02 PM

## 2013-05-09 NOTE — Progress Notes (Signed)
Occupational Therapy Session Note  Patient Details  Name: [name] MRN: [id number] Date of Birth: 1950/09/03  Today's Date: 05/09/2013 Time: 1001-1100 Time Calculation (min): 59 min  Short Term Goals: Week 1:  OT Short Term Goal 1 (Week 1): Pt will sit statically EOB with close supervision for 5 mins in preparation for selfcare tasks. OT Short Term Goal 2 (Week 1): Pt will perform UB bathing with min assist and mod demonstrational cueing supported in wheelchair. OT Short Term Goal 3 (Week 1): Pt will perform LB bathing sit to stand with total assist pt 60 %. OT Short Term Goal 4 (Week 1): Pt will perform toilet transfer with max assist to the right side. OT Short Term Goal 5 (Week 1): Pt/family will return demonstrate safe performance of PROM/AAROM exercises for the RUE.  Skilled Therapeutic Interventions/Progress Updates:    Pt began session in supine working on peri bathing and donning brief to begin session.  Pt needed total assist with max demonstrational cueing to sequence rolling to the left side and max assist with max demonstrational cueing to roll to the right side.  Pt initially needed max demonstrational cueing for him to wash his front peri area in supine as he was attempting to wash his right arm instead.  Initially pt was resistant to washing his peri area but eventually was able to perform.  Once he finished donning brief transferred to sitting position on the edge of the bed with total assist.  Pt with increased pushing to the right side in sitting.  Had him repeatedly come down onto his left forearm to help decrease pushing.  Pt needing max assist to maintain dynamic sitting balance while performing UB bathing and UB dressing.  Pt needing total assist for donning pants and socks over feet while sitting as well.  Total assist for sit to stand from the EOB to pull his pants over his hips.  Pt did not attempt to help pull pants over the hips and instead attempted to stabilize the  LUE on the bedside table.  Pt finished transfer to the wheelchair with total assist stand pivot to the right side. Half lap tray in place as well as safety belt.  Pt placed at nursing station for safety.     Therapy Documentation Precautions:  Precautions Precautions: Fall Precaution Comments: R sided inattention,Dense Right hemiparesis,  pusher toward Right, global aphasia deficits Restrictions Weight Bearing Restrictions: No  Pain: Pain Assessment Pain Assessment: Faces Faces Pain Scale: Hurts little more Pain Type: Acute pain Pain Location: Knee Pain Orientation: Right Pain Intervention(s): Repositioned ADL: See FIM for current functional status  Therapy/Group: Individual Therapy  Drake Wuertz OTR/L 05/09/2013, 12:47 PM

## 2013-05-09 NOTE — Evaluation (Signed)
Speech Language Pathology Assessment and Plan  Patient Details  Name: [name] MRN: [id number] Date of Birth: [birth year]  SLP Diagnosis: Aphasia;Dysarthria;Cognitive Impairments;Dysphagia  Rehab Potential: Good ELOS: 26-28 days   Today's Date: 05/09/2013 Time: 6203-5597 Time Calculation (min): 45 min  Problem List:  Patient Active Problem List   Diagnosis Date Noted  . probable undiagnosed OSA on CPAP 05/06/2013  . Other and unspecified hyperlipidemia 05/06/2013  . Hypokalemia 05/06/2013  . Chews tobacco 05/06/2013  . Dysphagia, secondary to stroke 05/06/2013  . Malignant hypertension 05/02/2013  . Acute respiratory distress 05/02/2013  . Tobacco abuse 05/02/2013  . 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 Hypertension    Past Surgical History: No past surgical history on file.  Assessment / Plan / Recommendation Clinical Impression  Pt is a 63 y.o. male with history of HTN--no medication; who was found down on floor with inability to speak or move right side. 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 BP elevated at 247/132 and patient started on cardene drip for control--NIH stroke score-22. 2 D echo with EF 60-65%, mild MR, mod LA dilation. Carotid dopplers without significant ICA stenosis. Patient with resultant dysarthria, expressive>receptive aphasia, left gaze preference, right hemiparesis with sensory deficits. Therapies initiated yesterday-diet recently advanced to dysphagia 1 honey liquids. Latest cranial CT scan stable and Lovenox has been initiated for DVT prophylaxis as of 05/05/13. Patient with acute respiratory distress 12/29 pm requiring  BIPAP/CPAP suspect untreated obstructive sleep apnea and follow pulmonary services. MD, PT, ST recommending CIR. patient was felt to be a good candidate for inpatient rehabilitation services and was admitted for comprehensive rehabilitation program 1/2; SLP bedside-swallow and cognitive-linguistic evals completed 1/5. Pt continues to present with signs of an oropharyngeal dysphagia with Dys 1 textures and honey thick liquids with anterior spillage, right-sided pocketing, and suspected delayed swallow initiation. Pt requires full supervision due to aspiration risk as well as impulsivity. Pt also presents with a mixed aphasia characterized by primarily nonverbal communication by means of gestures, impaired verbal communication marked by primarily single words that do not convey functional meaning, and decreased ability to follow one-step commands or receptively identify common objects from a field of two. Speech is marked by imprecise articulation and low vocal intensity, decreasing intelligibility. Pt also presents with decreased sustained attention; additional treatment focused on differential diagnosis of cognitive abilities is recommended as communication improves. Pt will benefit from skilled SLP services to maximize swallowing safety, functional communication, and cognitive function.    SLP Assessment  Patient will need skilled Speech Lanaguage Pathology Services during CIR admission    Recommendations  Diet Recommendations: Dysphagia 1 (Puree);Honey-thick liquid Liquid Administration via: Cup;No straw Medication Administration: Crushed with puree Supervision: Full supervision/cueing for compensatory strategies;Staff to assist with self feeding Compensations: Slow rate;Small sips/bites;Check for pocketing;Check for anterior loss Postural Changes and/or Swallow Maneuvers: Seated upright 90 degrees;Out of bed for meals;Upright 30-60 min after meal Oral Care Recommendations: Oral care BID Patient  destination: Home Follow up Recommendations: Home Health SLP;24 hour supervision/assistance Equipment Recommended: None recommended by SLP    SLP Frequency 5 out of 7 days   SLP Treatment/Interventions Cognitive remediation/compensation;Cueing hierarchy;Dysphagia/aspiration precaution training;Environmental controls;Functional tasks;Internal/external aids;Multimodal communication approach;Patient/family education;Speech/Language  facilitation    Pain Pain Assessment Pain Assessment: No/denies pain Prior Functioning Cognitive/Linguistic Baseline: Information not available Type of Home: House  Lives With: Significant other Available Help at Discharge: Available 24 hours/day;Family Vocation: On disability  Short Term Goals: Week 1: SLP Short Term Goal 1 (Week 1): Pt will utilize safe swallowing strategies with Dys 1 textures and honey thick liquids with Mod cues  SLP Short Term Goal 2 (Week 1): Pt will consume trials of nectar thick liquids with minimal overt s/s of aspiration with Max cues SLP Short Term Goal 3 (Week 1): Pt will sustain attention to basic functional task for 5 minutes with Max cues SLP Short Term Goal 4 (Week 1): Pt will communicate basic wants/needs via multimodal communication with Max cues SLP Short Term Goal 5 (Week 1): Pt will follow one-step commands with Max cues SLP Short Term Goal 6 (Week 1): Pt will receptively identify common objects from a field of three with Max cues  See FIM for current functional status Refer to Care Plan for Long Term Goals  Recommendations for other services: None  Discharge Criteria: Patient will be discharged from SLP if patient refuses treatment 3 consecutive times without medical reason, if treatment goals not met, if there is a change in medical status, if patient makes no progress towards goals or if patient is discharged from hospital.  The above assessment, treatment plan, treatment alternatives and goals were discussed and  mutually agreed upon: No family available/patient unable   Germain Osgood, M.A. CCC-SLP [phone]   Germain Osgood 05/09/2013, 5:58 PM

## 2013-05-09 NOTE — Care Management Note (Signed)
[facility]patient [facility] Individual Statement of Services  Patient Name:  [name] A Brester  Date:  05/09/2013  Welcome to the [facility]patient [facility].  Our goal is to provide you with an individualized program based on your diagnosis and situation, designed to meet your specific needs.  With this comprehensive rehabilitation program, you will be expected to participate in at least 3 hours of rehabilitation therapies Monday-Friday, with modified therapy programming on the weekends.  Your rehabilitation program will include the following services:  Physical Therapy (PT), Occupational Therapy (OT), Speech Therapy (ST), 24 hour per day rehabilitation nursing, Neuropsychology, Case Management (Social Worker), Rehabilitation Medicine, Nutrition Services and Pharmacy Services  Weekly team conferences will be held on Wednesday to discuss your progress.  Your Social Worker will talk with you frequently to get your input and to update you on team discussions.  Team conferences with you and your family in attendance may also be held.  Expected length of stay: 21-28 days  Overall anticipated outcome: min w/c level  Depending on your progress and recovery, your program may change. Your Social Worker will coordinate services and will keep you informed of any changes. Your Social Worker's name and contact numbers are listed  below.  The following services may also be recommended but are not provided by the Inpatient [facility]:   Driving Evaluations  Home Health Rehabiltiation Services  Outpatient Rehabilitation Services   Arrangements will be made to provide these services after discharge if needed.  Arrangements include referral to agencies that provide these services.  Your insurance has been verified to be:  Medicare & Medicaid Your primary doctor is:  None  Pertinent information will be shared with your doctor and your insurance company.  Social Worker:  Dossie DerBecky Arif Amendola, SW  [phone] or [phone]) [phone]  Information discussed with and copy given to patient by: Lucy Chrisupree, Leilan Bochenek G, 05/09/2013, 12:20 PM

## 2013-05-09 NOTE — Progress Notes (Addendum)
Physical Therapy Session Note  Patient Details  Name: Linna Capricedward A Hockenbury MRN: [id number] Date of Birth: 09/06/1950  Today's Date: 05/09/2013 Time: [id number] Time Calculation (min): 10 min  Short Term Goals: Week 1:  PT Short Term Goal 1 (Week 1): Patient will be able to perform bed mobility with Mod-assist. PT Short Term Goal 2 (Week 1): Patient will be able to perform transfers with Max-assist. PT Short Term Goal 3 (Week 1): Patient will be able to perform dynamic sitting balance with Mod-assist. PT Short Term Goal 4 (Week 1): Patient will be able to propel w/c 2650' with Mod-assist.  Skilled Therapeutic Interventions/Progress Updates:   Pt received sitting at nursing station with therapist provided max encouragement to participate with therapy.  Attempted to unlock w/c brakes and assist to gym for therapy, however pt resisted therapist using LUE and when therapist continued to attempt, he would grasp therapists hand and move away from brakes.  Allowed several mins to pass when OT attempted to speak with pt regarding pt having a stroke, being on rehab to participate with therapy in order to get stronger and return home.   Pt did allow OT to unlock brakes for this PT to assist pt to room in order to have pt attempt to brush teeth.  Upon entering room, provided pt with brushing sponge with small amount of biotene in order to wipe on lips, tongue and teeth.  Pt did grasp brushing sponge, however refused to self brush.  He did allow this therapist to assist with cleaning lips and teeth somewhat before also refusing again.  Then attempted to have pt transfer back to bed going to the R, however he continued to adamantly refuse this morning.  Donned quick release belt and R half lap tray and returned pt to nursing station.  RN aware.    Will likely have to communicate with pts spouse to attend therapy in order to have pt participate in therapy.    Therapy Documentation Precautions:  Precautions Precautions:  Fall Precaution Comments: R sided inattention,Dense Right hemiparesis,  pusher toward Right, global aphasia deficits Restrictions Weight Bearing Restrictions: No General: Amount of Missed PT Time (min): 35 Minutes Missed Time Reason: Patient unwilling/refused to participate without medical reason    Pain: Pt denies pain when asked.  See FIM for current functional status  Therapy/Group: Individual Therapy  Vista Deckarcell, Loukisha Gunnerson Ann 05/09/2013, 12:05 PM

## 2013-05-09 NOTE — Progress Notes (Signed)
Patient information reviewed and entered into eRehab system by Tora DuckMarie Vahe Pienta, RN, CRRN, PPS Coordinator.  Information including medical coding and functional independence measure will be reviewed and updated through discharge.     Per nursing patient was given "Data Collection Information Summary for Patients in Inpatient Rehabilitation Facilities with attached "Privacy Act Statement-Health Care Records" upon admission but due to mental status of patient will need to be reviewed with family.

## 2013-05-09 NOTE — Plan of Care (Signed)
Problem: RH BOWEL ELIMINATION Goal: RH STG MANAGE BOWEL WITH ASSISTANCE STG Manage Bowel with mod Assistance.  Outcome: Not Progressing Patients last BM 05/05/12

## 2013-05-09 NOTE — Progress Notes (Signed)
I was called by Pt RN who notified me Pt took himself off CPAP and refuses to go back on.  Pt placed back on [place] and is currently stable.  RT to monitor as needed.

## 2013-05-09 NOTE — IPOC Note (Addendum)
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     Functional Problem List: Nursing    PT Balance;Edema;Endurance;Motor;Pain;Perception;Safety;Sensory  OT Balance;Perception;Cognition;Sensory;Safety;Endurance;Motor  SLP Cognition;Linguistic  TR         Basic ADL's: OT Eating;Grooming;Bathing;Dressing;Toileting     Advanced  ADL's: OT       Transfers: PT Bed Mobility;Bed to Chair;Car;Furniture  OT Toilet;Tub/Shower     Locomotion: PT Ambulation;Wheelchair Mobility;Stairs     Additional Impairments: OT Fuctional Use of Upper Extremity  SLP Swallowing;Communication;Social Cognition comprehension;expression Attention;Problem Solving  TR      Anticipated Outcomes Item Anticipated Outcome  Self Feeding supervision  Swallowing  Min cues with least restrictive PO   Basic self-care  min assist level  Toileting  min assist level   Bathroom Transfers min assist level  Bowel/Bladder     Transfers  min-assist  Locomotion  TBD after R knee painful effusion and R ankle pain resolve, may ultimately require w/c for safety and mobility  Communication  Min with multimodal communiation  Cognition  Min  Pain     Safety/Judgment      Therapy Plan: PT Intensity: Minimum of 1-2 x/day ,45 to 90 minutes PT Frequency: 5 out of 7 days PT Duration Estimated Length of Stay: 4 weeks OT Intensity: Minimum of 1-2 x/day, 45 to 90 minutes OT Frequency: 5 out of 7 days OT Duration/Estimated Length of Stay: 26-28 days SLP Intensity: Minumum of 1-2 x/day, 30 to 90 minutes SLP Frequency: 5 out of 7 days SLP Duration/Estimated Length of Stay: 26-28 days       Team Interventions: Nursing Interventions    PT interventions Ambulation/gait training;Balance/vestibular training;Cognitive  remediation/compensation;DME/adaptive equipment instruction;Functional mobility training;Neuromuscular re-education;Pain management;Patient/family education;Stair training;Therapeutic Activities;Therapeutic Exercise;UE/LE Strength taining/ROM;UE/LE Coordination activities;Wheelchair propulsion/positioning  OT Interventions Balance/vestibular training;Cognitive remediation/compensation;Community reintegration;Discharge planning;DME/adaptive equipment instruction;Functional electrical stimulation;Neuromuscular re-education;Patient/family education;Pain management;Functional mobility training;Therapeutic Activities;UE/LE Strength taining/ROM;Visual/perceptual remediation/compensation;UE/LE Coordination activities;Therapeutic Exercise;Splinting/orthotics;Self Care/advanced ADL retraining  SLP Interventions Cognitive remediation/compensation;Cueing hierarchy;Dysphagia/aspiration precaution training;Environmental controls;Functional tasks;Internal/external aids;Multimodal communication approach;Patient/family education;Speech/Language facilitation  TR Interventions    SW/CM Interventions Discharge Planning;Psychosocial Support;Patient/Family Education    Team Discharge Planning: Destination: PT-Home ,OT- Home , SLP-Home Projected Follow-up: PT-Home health PT, OT-  24 hour supervision/assistance;Home health OT, SLP-Home Health SLP;24 hour supervision/assistance Projected Equipment Needs: PT- , OT- 3 in 1 bedside comode;Tub/shower bench, SLP-None recommended by SLP Equipment Details: PT- , OT-  Patient/family involved in discharge planning: PT- Family member/caregiver,  OT-Patient unable/family or caregiver not available, SLP-Patient unable/family or caregive not available  MD ELOS: 22-25 days Medical Rehab Prognosis:  Good Assessment: 63 y.o. male with history of HTN--no medication; who was found down on floor with inability to speak or move right side. He was admitted on 05/01/13 and found to have Large  4.6 x 3.9 cm intraparenchymal hematoma at the left parietal lobe, adjacent to the left basal ganglia and thalamus with extension of blood into the left lateral ventricle, with mild associated vasogenic edema. BP elevated at 247/132 and patient started on cardene drip for control--NIH stroke score-22. 2 D echo with EF 60-65%, mild MR, mod LA dilation. Carotid dopplers without significant ICA stenosis. Patient with resultant dysarthria, expressive>receptive aphsia, left gaze preference, right hemiparesis with sensory deficits. Therapies initiated yesterday-diet recently advanced to dysphagia 1 honey liquids .latest cranial CT scan stable and Lovenox has been initiated for DVT prophylaxis as of  05/05/2013  Now requiring 24/7 Rehab RN,MD, as well as CIR level PT, OT and SLP.  Treatment team will focus on ADLs and [facility]    See Team Conference Notes for weekly updates to the plan of care

## 2013-05-10 ENCOUNTER — Inpatient Hospital Stay (HOSPITAL_COMMUNITY): Payer: Medicare Other

## 2013-05-10 ENCOUNTER — Inpatient Hospital Stay (HOSPITAL_COMMUNITY): Payer: Medicare Other | Admitting: Physical Therapy

## 2013-05-10 DIAGNOSIS — G8110 Spastic hemiplegia affecting unspecified side: Secondary | ICD-10-CM

## 2013-05-10 DIAGNOSIS — M109 Gout, unspecified: Secondary | ICD-10-CM

## 2013-05-10 DIAGNOSIS — I619 Nontraumatic intracerebral hemorrhage, unspecified: Secondary | ICD-10-CM

## 2013-05-10 DIAGNOSIS — I69920 Aphasia following unspecified cerebrovascular disease: Secondary | ICD-10-CM

## 2013-05-10 MED ORDER — ENSURE PUDDING PO PUDG
1.0000 | Freq: Three times a day (TID) | ORAL | Status: DC
  Administered 2013-05-10 – 2013-05-19 (×26): 1 via ORAL

## 2013-05-10 MED ORDER — LABETALOL HCL 300 MG PO TABS
300.0000 mg | ORAL_TABLET | Freq: Two times a day (BID) | ORAL | Status: DC
  Administered 2013-05-10 – 2013-06-06 (×55): 300 mg via ORAL
  Filled 2013-05-10 (×58): qty 1

## 2013-05-10 NOTE — Progress Notes (Signed)
Attempted to place pt on nasal CPAP. Pt continued to pull at mask. RT placed pt back on [place] for the night. No distress noted.

## 2013-05-10 NOTE — Progress Notes (Signed)
Occupational Therapy Session Note  Patient Details  Name: [name] MRN: [id number] Date of Birth: 06-Sep-1950  Today's Date: 05/10/2013 Time: 1300-1330 (co-tx with PT 1300-1400) Time Calculation (min): 30 min  Short Term Goals: Week 1:  OT Short Term Goal 1 (Week 1): Pt will sit statically EOB with close supervision for 5 mins in preparation for selfcare tasks. OT Short Term Goal 2 (Week 1): Pt will perform UB bathing with min assist and mod demonstrational cueing supported in wheelchair. OT Short Term Goal 3 (Week 1): Pt will perform LB bathing sit to stand with total assist pt 60 %. OT Short Term Goal 4 (Week 1): Pt will perform toilet transfer with max assist to the right side. OT Short Term Goal 5 (Week 1): Pt/family will return demonstrate safe performance of PROM/AAROM exercises for the RUE.  Skilled Therapeutic Interventions/Progress Updates:  Pt resting in bed; pt initially closing eyes and shaking his head "no" to OOB activity/therapy. When therapist pointed out he had spilled lunch on his gown, pt agreeable to sitting EOB to don clean gown. Pt advanced LLE off of bed but required +2 to advance RLE to EOB and to transition from supine > sit EOB secondary to IV and pushing to Rt. Verbal and demonstration cues for pt to lean to Lt elbow EOB to minimize pushing and LOB during doffing of dirty gown and donning of clean gown. Encouraged pt to transfer to w/c by informing him that his sheets needed to be changed. Performed multiple squat scoots to w/c to Rt side with +2 A with pt performing ~30% of scooting. Once in w/c pt gesturing to be placed at sink where he began searching for items; determined that pt was requesting toothbrush through questioning and offering items. Pt brushed teeth with wet toothbrush but unable to rinse or spit into sink, shaking his head "no" when cueing or gesturing. Pt IV noted to not be holding charge; RN alerted who was able to switch pt to another IV pole. Pt  placing cup down near perineal area; when asked if he needed to urinate pt responded yes. This therapist retrieved urinal and assisted pt with removal of brief and placement of urinal in sitting; pt unable to void. Pt easily frustrated and required max cues and redirection to first get out of bed and then out of the room.    Therapy Documentation Precautions:  Precautions Precautions: Fall Precaution Comments: R sided inattention,Dense Right hemiparesis,  pusher toward Right, global aphasia deficits Restrictions Weight Bearing Restrictions: No General: General Amount of Missed OT Time (min): 30 Minutes Vital Signs:   Pain: Pain Assessment Pain Assessment: No/denies pain  See FIM for current functional status  Therapy/Group: Co-Treatment  Masud Holub 05/10/2013, 2:50 PM

## 2013-05-10 NOTE — Progress Notes (Signed)
Physical Therapy Session Note  Patient Details  Name: [name] MRN: [id number] Date of Birth: 1950-05-27  Today's Date: 05/10/2013 Time: 1330 (full time 1300-1400 cotreat with OT)-1400 Time Calculation (min): 30 min  Short Term Goals: Week 1:  PT Short Term Goal 1 (Week 1): Patient will be able to perform bed mobility with Mod-assist. PT Short Term Goal 2 (Week 1): Patient will be able to perform transfers with Max-assist. PT Short Term Goal 3 (Week 1): Patient will be able to perform dynamic sitting balance with Mod-assist. PT Short Term Goal 4 (Week 1): Patient will be able to propel w/c 7150' with Mod-assist.  Skilled Therapeutic Interventions/Progress Updates:   Pt resting in bed; pt initially closing eyes and shaking his head "no" to OOB activity/therapy.  Pt noted to have spilled lunch on his gown.  Pt agreeable to sitting EOB to don clean gown.  Pt advance LLE off of bed but required +2 to advance RLE to EOB and to transition from supine > sit EOB secondary to IV and pushing to R.  Cued pt to lean to L elbow EOB during doffing of dirty gown and donning of clean gown to minimize pushing and LOB.  While transferring supine > sit pt sheets popped off of bed; alerted pt that his sheets needed to be changed and encouraged pt to transfer to w/c.  Performed multiple squat scoots to w/c to R side with +2 A with pt performing 30% of scooting.  Once in w/c pt placed at sink where he began searching for items; determined that pt was requesting toothbrush through questioning and offering items.  Pt brushed teeth with wet toothbrush but unable to rinse or spit into sink.  Pt IV noted to not be holding charge; RN alerted who was able to switch pt to another IV pole.    Pt placing cup down near perineal area; when asked if he needed to urinate pt responded yes.  OT retrieved urinal and assisted pt with removal of brief and placement of urinal in sitting; pt unable to void.  Pt informed that one of his leg  rests were missing and that he needed to travel with therapy down to the w/c storage to find a matching set of leg rests.  Pt agreeable but pt refused to perform any w/c mobility with LLE.  Pt set up with ELR on R (secondary to ER and LE sliding off regular LR) and regular LR for LLE.  Transported to SLP office to begin speech therapy session.    Therapy Documentation Precautions:  Precautions Precautions: Fall Precaution Comments: R sided inattention,Dense Right hemiparesis,  pusher toward Right, global aphasia deficits Restrictions Weight Bearing Restrictions: No Pain: Pain Assessment Pain Assessment: No/denies pain  See FIM for current functional status  Therapy/Group: Co-Treatment  [name], [name] 05/10/2013, 2:46 PM

## 2013-05-10 NOTE — Progress Notes (Signed)
Subjective/Complaints: Aphasic not able to voice c/os Appears frustrated Review of Systems - not able to obtain secondary to aphasia Objective: Vital Signs: Blood pressure 168/84, pulse 80, temperature 97.1 F (36.2 C), temperature source Oral, resp. rate 18, height $RemoveBe'6\' 5"'ciqZIyvrD$  (1.956 m), weight 83.462 kg (184 lb), SpO2 97.00%. No results found. Results for orders placed during the hospital encounter of 05/06/13 (from the past 72 hour(s))  CBC WITH DIFFERENTIAL     Status: Abnormal   Collection Time    05/07/13  3:45 PM      Result Value Range   WBC 10.9 (*) 4.0 - 10.5 K/uL   RBC 5.05  4.22 - 5.81 MIL/uL   Hemoglobin 15.6  13.0 - 17.0 g/dL   HCT 45.0  39.0 - 52.0 %   MCV 89.1  78.0 - 100.0 fL   MCH 30.9  26.0 - 34.0 pg   MCHC 34.7  30.0 - 36.0 g/dL   RDW 12.8  11.5 - 15.5 %   Platelets 219  150 - 400 K/uL   Neutrophils Relative % 78 (*) 43 - 77 %   Neutro Abs 8.5 (*) 1.7 - 7.7 K/uL   Lymphocytes Relative 9 (*) 12 - 46 %   Lymphs Abs 1.0  0.7 - 4.0 K/uL   Monocytes Relative 12  3 - 12 %   Monocytes Absolute 1.3 (*) 0.1 - 1.0 K/uL   Eosinophils Relative 1  0 - 5 %   Eosinophils Absolute 0.1  0.0 - 0.7 K/uL   Basophils Relative 0  0 - 1 %   Basophils Absolute 0.0  0.0 - 0.1 K/uL  CBC WITH DIFFERENTIAL     Status: Abnormal   Collection Time    05/09/13  6:20 AM      Result Value Range   WBC 8.9  4.0 - 10.5 K/uL   RBC 5.45  4.22 - 5.81 MIL/uL   Hemoglobin 16.3  13.0 - 17.0 g/dL   HCT 48.3  39.0 - 52.0 %   MCV 88.6  78.0 - 100.0 fL   MCH 29.9  26.0 - 34.0 pg   MCHC 33.7  30.0 - 36.0 g/dL   RDW 12.7  11.5 - 15.5 %   Platelets 231  150 - 400 K/uL   Neutrophils Relative % 79 (*) 43 - 77 %   Neutro Abs 7.0  1.7 - 7.7 K/uL   Lymphocytes Relative 10 (*) 12 - 46 %   Lymphs Abs 0.9  0.7 - 4.0 K/uL   Monocytes Relative 10  3 - 12 %   Monocytes Absolute 0.9  0.1 - 1.0 K/uL   Eosinophils Relative 1  0 - 5 %   Eosinophils Absolute 0.1  0.0 - 0.7 K/uL   Basophils Relative 0  0 - 1 %    Basophils Absolute 0.0  0.0 - 0.1 K/uL  COMPREHENSIVE METABOLIC PANEL     Status: Abnormal   Collection Time    05/09/13  6:20 AM      Result Value Range   Sodium 148 (*) 137 - 147 mEq/L   Comment: Please note change in reference range.   Potassium 3.9  3.7 - 5.3 mEq/L   Comment: Please note change in reference range.   Chloride 107  96 - 112 mEq/L   CO2 23  19 - 32 mEq/L   Glucose, Bld 115 (*) 70 - 99 mg/dL   BUN 40 (*) 6 - 23 mg/dL   Creatinine, Ser 1.26  0.50 - 1.35 mg/dL   Calcium 9.7  8.4 - 10.5 mg/dL   Total Protein 7.8  6.0 - 8.3 g/dL   Albumin 2.8 (*) 3.5 - 5.2 g/dL   AST 33  0 - 37 U/L   ALT 70 (*) 0 - 53 U/L   Alkaline Phosphatase 95  39 - 117 U/L   Total Bilirubin 1.1  0.3 - 1.2 mg/dL   GFR calc non Af Amer 59 (*) >90 mL/min   GFR calc Af Amer 69 (*) >90 mL/min   Comment: (NOTE)     The eGFR has been calculated using the CKD EPI equation.     This calculation has not been validated in all clinical situations.     eGFR's persistently <90 mL/min signify possible Chronic Kidney     Disease.      Constitutional: He appears well-developed and well-nourished.  HENT:  Head: Normocephalic and atraumatic.  Eyes: Conjunctivae are normal. Pupils are equal, round, and reactive to light.  Neck: Normal range of motion. Neck supple.  Cardiovascular: Regular rhythm. Tachycardia present.  Respiratory: Effort normal and breath sounds normal. No respiratory distress. He has no wheezes.   GI: Soft. Bowel sounds are normal. He exhibits no distension. There is no tenderness.  Neurological: He is alert.   Flat affect with delayed processing. Restless and distracted. Minimal verbal output. Dense right hemiparesis with sensory deficits. Moves LUE/LLE with visual/verbal cues. Unable to follow simple commands. RUE and RLE are 0/5. Does seem to have some gross pain sense in the right leg. Able to only state name, moderate dysarthria Skin: Skin is warm and dry    Assessment/Plan: 1.  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 Physiatrist is providing close team supervision and 24 hour management of active medical problems listed below. Physiatrist and rehab team continue to assess barriers to discharge/monitor patient progress toward functional and medical goals. Team conf in am FIM: FIM - Bathing Bathing Steps Patient Completed: Chest;Right Arm;Abdomen;Right upper leg;Left upper leg Bathing: 1: Two helpers  FIM - Upper Body Dressing/Undressing Upper body dressing/undressing steps patient completed: Put head through opening of pull over shirt/dress Upper body dressing/undressing: 0: Wears gown/pajamas-no public clothing FIM - Lower Body Dressing/Undressing Lower body dressing/undressing: 1: Two helpers  FIM - Toileting Toileting: 1: Total-Patient completed zero steps, helper did all 3 (per Berkley Harvey, NT report)  FIM - Toilet Transfers Toilet Transfers: 0-Activity did not occur  FIM - Bed/Chair Transfer Bed/Chair Transfer: 1: Supine > Sit: Total A (helper does all/Pt. < 25%);1: Two helpers  FIM - Locomotion: Wheelchair Locomotion: Wheelchair: 0: Activity did not occur FIM - Locomotion: Ambulation Ambulation/Gait Assistance: Not tested (comment)  Comprehension Comprehension Mode: Auditory Comprehension: 2-Understands basic 25 - 49% of the time/requires cueing 51 - 75% of the time  Expression Expression Mode: Nonverbal Expression: 2-Expresses basic 25 - 49% of the time/requires cueing 50 - 75% of the time. Uses single words/gestures.  Social Interaction Social Interaction: 2-Interacts appropriately 25 - 49% of time - Needs frequent redirection.  Problem Solving Problem Solving: 2-Solves basic 25 - 49% of the time - needs direction more than half the time to initiate, plan or complete simple  activities  Memory Memory: 1-Recognizes or recalls less than 25% of the time/requires cueing greater than 75% of the time  Medical Problem List and Plan:  1. Left parietal/basal ganglia ICH felt  to be secondary to malignant hypertension  2. DVT Prophylaxis/Anticoagulation: Subcutaneous Lovenox initiated 05/05/2013. Monitor platelet counts any signs of bleeding  3. Pain Management: Tylenol as needed  4. Neuropsych: This patient is not capable of making decisions on his own behalf.  5. Dysphagia. Dysphagia 1 honey thick liquids. Monitor closely for any aspiration. Followup speech therapy  6. Hypertension.Uncontrolled with tachycardia Norvasc 10 mg daily, hydrochlorothiazide 25 mg daily. Monitor with increased mobility, titrate BB 7. OSA. CPAP. Will follow pulmonary services as needed    LOS (Days) 4 A FACE TO FACE EVALUATION WAS PERFORMED  KIRSTEINS,ANDREW E 05/10/2013, 8:20 AM

## 2013-05-10 NOTE — Progress Notes (Addendum)
Physical Therapy Note  Patient Details  Name: Linna Capricedward A Munyan MRN: [id number] Date of Birth: Jun 09, 1950 Today's Date: 05/10/2013  [id number] (15 minutes) individual (missed 15 minutes /fatigue) Pain: pt displays increased pain Rt LE with movement/ premedicated Focus of treatment: transfer training; sitting balance Treatment: Pt up in wc at nurses station with increased restlessness ; transfer squat/pivot max assist +1 with second person for safety wc to bed; sitting edge of bed max assist with posterior push (attempted to work on sitting balance but pt returned to supine; sit to supine mod/max assist with pt assisting with left LE only; bed alarm activated and 3 rails up.    Jasmon Graffam,JIM 05/10/2013, 3:48 PM

## 2013-05-10 NOTE — Progress Notes (Signed)
Pt. States that he doesn't want to wear CPAP at this time. Pt. Is aware to call if he changes his mind & decides to wear CPAP tonight.

## 2013-05-10 NOTE — Progress Notes (Signed)
Occupational Therapy Session Note  Patient Details  Name: [name] MRN: [id number] Date of Birth: 02-Feb-1951  Today's Date: 05/10/2013 Time: 1030-1100 Time Calculation (min): 30 min  Short Term Goals: Week 1:  OT Short Term Goal 1 (Week 1): Pt will sit statically EOB with close supervision for 5 mins in preparation for selfcare tasks. OT Short Term Goal 2 (Week 1): Pt will perform UB bathing with min assist and mod demonstrational cueing supported in wheelchair. OT Short Term Goal 3 (Week 1): Pt will perform LB bathing sit to stand with total assist pt 60 %. OT Short Term Goal 4 (Week 1): Pt will perform toilet transfer with max assist to the right side. OT Short Term Goal 5 (Week 1): Pt/family will return demonstrate safe performance of PROM/AAROM exercises for the RUE.  Skilled Therapeutic Interventions/Progress Updates:    Pt seen for ADL retraining with focus on bed mobility, following commands, and scanning to right. Pt received supine in bed. Completed peri bathing and donning brief while supine in bed with pt assisting with peri hygiene with mod cueing. Pt required +2 assist for rolling to L and R for buttocks hygiene. Pt required total assist and max demonstrational cues to sequence rolling. Pt noted in R knee during bed mobility with flexion. Provided auditory cues to turn head to R side during bed mobility. Encouraged pt to sit on EOB with therapists however pt declining stating "no." Encouraged participation by discussing getting up for therapy to get stronger and go home however pt continued to decline and shutting eyes occasionally. Pt agreeable to wash face when presented wash cloth with min demonstrational cues. Pt continued to decline participation in therapy. Required +2 assist for optimal positioning in bed. Pt supine in bed with bed alarm on and all needs in reach. RN notified.    Therapy Documentation Precautions:  Precautions Precautions: Fall Precaution Comments: R  sided inattention,Dense Right hemiparesis,  pusher toward Right, global aphasia deficits Restrictions Weight Bearing Restrictions: No General: General Amount of Missed OT Time (min): 30 Minutes Vital Signs: Therapy Vitals Pulse Rate: 82 BP: 153/92 mmHg Patient Position, if appropriate: Lying Oxygen Therapy SpO2: 97 % O2 Device: Nasal cannula Pain: Pain noted in R knee during bed mobility. Pt unable to accurately report pain secondary to global aphasia.  See FIM for current functional status  Therapy/Group: Individual Therapy  Miciah Shealy, Vara GuardianKayla N 05/10/2013, 11:08 AM

## 2013-05-10 NOTE — Progress Notes (Signed)
Speech Language Pathology Daily Session Note  Patient Details  Name: [name] A Heiss MRN: [id number] Date of Birth: [birth year]  Today's Date: 05/10/2013 Time: [id number] Time Calculation (min): 45 min  Short Term Goals: Week 1: SLP Short Term Goal 1 (Week 1): Pt will utilize safe swallowing strategies with Dys 1 textures and honey thick liquids with Mod cues  SLP Short Term Goal 2 (Week 1): Pt will consume trials of nectar thick liquids with minimal overt s/s of aspiration with Max cues SLP Short Term Goal 3 (Week 1): Pt will sustain attention to basic functional task for 5 minutes with Max cues SLP Short Term Goal 4 (Week 1): Pt will communicate basic wants/needs via multimodal communication with Max cues SLP Short Term Goal 5 (Week 1): Pt will follow one-step commands with Max cues SLP Short Term Goal 6 (Week 1): Pt will receptively identify common objects from a field of three with Max cues  Skilled Therapeutic Interventions: Skilled treatment focused on cognitive-linguistic and swallowing goals. SLP facilitated session with skilled observation of trials of nectar thick liquids via cup sips and pureed solids. When asked if he wanted milk or juice, pt replied "milk." Pt consumed nectar thick liquids with Max cues for small sips. Right anterior spillage observed, with no overt s/s of aspiration. Pt appeared to demonstrate an ideational apraxia, trying to drink from a spoon. Max visual cues were provided for pt to clear anterior spillage with napkin. Pt was restless and agitated throughout session, requiring Max cues for sustained attention to tasks in brief (<30 second) intervals. He identified colored blocks from a field of three with 100% accuracy. When asked if pt was tired, he replied "I am." Pt made more attempts at verbal communication today, although minimal verbal output was marked primarily by neologisms. At the end of session, pt spontaneously said "cut loose." Continue plan of care.    FIM:  Comprehension Comprehension Mode: Auditory Comprehension: 2-Understands basic 25 - 49% of the time/requires cueing 51 - 75% of the time Expression Expression Mode: Nonverbal Expression: 2-Expresses basic 25 - 49% of the time/requires cueing 50 - 75% of the time. Uses single words/gestures. Social Interaction Social Interaction: 2-Interacts appropriately 25 - 49% of time - Needs frequent redirection. Problem Solving Problem Solving: 2-Solves basic 25 - 49% of the time - needs direction more than half the time to initiate, plan or complete simple activities Memory Memory: 1-Recognizes or recalls less than 25% of the time/requires cueing greater than 75% of the time FIM - Eating Eating Activity: 5: Needs verbal cues/supervision;4: Helper occasionally scoops food on utensil;4: Helper checks for pocketed food  Pain Pain Assessment Pain Assessment: No/denies pain  Therapy/Group: Individual Therapy   Maxcine HamLaura Paiewonsky, M.A. CCC-SLP [phone]   Maxcine Hamaiewonsky, Raydel Hosick 05/10/2013, 4:30 PM

## 2013-05-10 NOTE — Progress Notes (Signed)
INITIAL NUTRITION ASSESSMENT  DOCUMENTATION CODES Per approved criteria  -Not Applicable   INTERVENTION: 1.  General healthful diet; encourage intake of foods and beverages as able.  RD to follow and assess for nutritional adequacy.  2.  Supplements; Ensure Pudding po TID, each supplement provides 170 kcal and 4 grams of protein. 3.  Enteral nutrition; if pt continues to refuse nutrition PO, consider initiation of TFs. Please consult RD if warranted.   NUTRITION DIAGNOSIS: Inadequate oral intake related to poor appetite as evidenced by refusing meals, PO 0-5%.   Monitor:  1.  Food/Beverage; pt meeting >/=90% estimated needs with tolerance. 2.  Wt/wt change; monitor trends  Reason for Assessment: Low Braden  63 y.o. male  Admitting Dx: Left MCA infarct  ASSESSMENT: Pt admitted with functional deficits r/t left MCA infarct, R hemiplegia, aphasia, and dysphagia. Pt not in room at time of visit.   Discussed nutrition with NT who reports pt is largely refusing meals.  Takings 1-2 spoonfuls at each meal.   Pt initially was not appropriate for POs requiring a feeding tube and TFs x ~24 hrs before passing swallowing eval.  Pt is aphasic, unable to communicate needs.   Pt has not been able to meet his nutrition needs x4 days.  Will order additional supplements for pt, however acceptance/MS is largest barrier to intake at this time.  Pt very frustrated during attempted conversation. Attempting to speak, however unable to communicate needs.   Height: Ht Readings from Last 1 Encounters:  05/06/13 6\' 5"  (1.956 m)  **Does not appear to be accurate. Most recent ht of 5'10" appears to be more accurate.   Weight: Wt Readings from Last 1 Encounters:  05/06/13 184 lb (83.462 kg)    Ideal Body Weight: 75.4 kg  % Ideal Body Weight: 110%  Wt Readings from Last 10 Encounters:  05/06/13 184 lb (83.462 kg)  05/04/13 194 lb 7.1 oz (88.2 kg)    Usual Body Weight: 190 lbs, on average  %  Usual Body Weight: 96%  BMI:  Body mass index is 21.81 kg/(m^2).  Estimated Nutritional Needs: Kcal: 2000-2200 Protein: 105-125g Fluid: >2.0 L/day  Skin: intact  Diet Order: Dysphagia 1, honey-thick  EDUCATION NEEDS: -Education not appropriate at this time   Intake/Output Summary (Last 24 hours) at 05/10/13 1427 Last data filed at 05/10/13 1200  Gross per 24 hour  Intake    940 ml  Output    403 ml  Net    537 ml    Last BM: 1/5  Labs:   Recent Labs Lab 05/05/13 0453 05/06/13 0440 05/06/13 2020 05/09/13 0620  NA 141 142  --  148*  K 3.5* 3.9  --  3.9  CL 102 104  --  107  CO2 20 24  --  23  BUN 19 25*  --  40*  CREATININE 0.93 0.99 1.05 1.26  CALCIUM 9.0 9.4  --  9.7  GLUCOSE 100* 110*  --  115*    CBG (last 3)  No results found for this basename: GLUCAP,  in the last 72 hours  Scheduled Meds: . amLODipine  10 mg Oral Daily  . antiseptic oral rinse  15 mL Mouth Rinse q12n4p  . chlorhexidine  15 mL Mouth Rinse BID  . enoxaparin (LOVENOX) injection  40 mg Subcutaneous Q24H  . hydrochlorothiazide  25 mg Oral Daily  . labetalol  300 mg Oral BID  . pantoprazole sodium  40 mg Per Tube Daily  Continuous Infusions: . sodium chloride 75 mL/hr at 05/09/13 2005    Past Medical History  Diagnosis Date  . Hypertension     No past surgical history on file.  Loyce DysKacie Crystian Frith, MS RD LDN Clinical Inpatient Dietitian Pager: [phone] Weekend/After hours pager: [phone]

## 2013-05-11 ENCOUNTER — Inpatient Hospital Stay (HOSPITAL_COMMUNITY): Payer: Medicare Other

## 2013-05-11 ENCOUNTER — Encounter (HOSPITAL_COMMUNITY): Payer: Medicare Other | Admitting: Occupational Therapy

## 2013-05-11 ENCOUNTER — Inpatient Hospital Stay (HOSPITAL_COMMUNITY): Payer: Medicare Other | Admitting: Physical Therapy

## 2013-05-11 ENCOUNTER — Inpatient Hospital Stay (HOSPITAL_COMMUNITY): Payer: Medicare Other | Admitting: Rehabilitation

## 2013-05-11 DIAGNOSIS — IMO0002 Reserved for concepts with insufficient information to code with codable children: Secondary | ICD-10-CM | POA: Diagnosis not present

## 2013-05-11 DIAGNOSIS — M1710 Unilateral primary osteoarthritis, unspecified knee: Secondary | ICD-10-CM | POA: Diagnosis not present

## 2013-05-11 LAB — URIC ACID: URIC ACID, SERUM: 11.3 mg/dL — AB (ref 4.0–7.8)

## 2013-05-11 NOTE — Progress Notes (Signed)
Patient has been refusing the CPAP machine and is aware to call RT if he does change his mind about wearing the machine. RT will continue to assist as needed.

## 2013-05-11 NOTE — Progress Notes (Signed)
Physical Therapy Note  Patient Details  Name: [name] MRN: [id number] Date of Birth: 1951-02-26 Today's Date: 05/11/2013  Pt continues to adamantly refuse to participate in therapy this morning.  Provided max encouragement, education as to type of stroke and pts potential to progress in therapy if he were to participate.  Attempted to remove pts covers to begin assisting pt to EOB, however resisted removal of covers.  Spoke with CSW in order to retreive significant others phone number.  Called and spoke with significant other's daughter who then spoke with pt, providing max encouragement for pt to participate in therapy.  Pt still refusing to get OOB.  Also had therapy supervisor Gaylyn RongKris speak with pt to provide motivation and education to pt and also explaining that if he does not participate, that he will then have to D/C to SNF.  Note pts family going to visit pt this morning, will attempt to assist pt OOB at that time.     Vista Deckarcell, Joliene Salvador Ann 05/11/2013, 10:04 AM

## 2013-05-11 NOTE — Progress Notes (Signed)
Speech Language Pathology Daily Session Note  Patient Details  Name: [name] MRN: [id number] Date of Birth: [birth year]  Today's Date: 05/11/2013 Time: [id number] Time Calculation (min): 45 min  Short Term Goals: Week 1: SLP Short Term Goal 1 (Week 1): Pt will utilize safe swallowing strategies with Dys 1 textures and honey thick liquids with Mod cues  SLP Short Term Goal 2 (Week 1): Pt will consume trials of nectar thick liquids with minimal overt s/s of aspiration with Max cues SLP Short Term Goal 3 (Week 1): Pt will sustain attention to basic functional task for 5 minutes with Max cues SLP Short Term Goal 4 (Week 1): Pt will communicate basic wants/needs via multimodal communication with Max cues SLP Short Term Goal 5 (Week 1): Pt will follow one-step commands with Max cues SLP Short Term Goal 6 (Week 1): Pt will receptively identify common objects from a field of three with Max cues  Skilled Therapeutic Interventions: Skilled treatment focused on cognitive-linguistic goals. SLP facilitated session with structured linguistic tasks. Pt was unable to receptively identify objects from a field of 3 or 2 despite Max multimodal cue. Pt counted 1-10 and stated 3 days of the week with Max cues. Pt spontaneously stated, "have to go to bathroom" and verbally communicated that he had to have a bowel movement. Pt was returned to room and assisted to bedside commode with NT assistance, requiring Max cues for following commands and impulsivity throughout toileting. Pt was returned to bed with all needs in reach. Continue plan of care.   FIM:  Comprehension Comprehension Mode: Auditory Comprehension: 2-Understands basic 25 - 49% of the time/requires cueing 51 - 75% of the time Expression Expression Mode: Nonverbal;Verbal Expression: 2-Expresses basic 25 - 49% of the time/requires cueing 50 - 75% of the time. Uses single words/gestures. Social Interaction Social Interaction: 2-Interacts  appropriately 25 - 49% of time - Needs frequent redirection. Problem Solving Problem Solving: 2-Solves basic 25 - 49% of the time - needs direction more than half the time to initiate, plan or complete simple activities Memory Memory: 1-Recognizes or recalls less than 25% of the time/requires cueing greater than 75% of the time  Pain Pain Assessment Pain Assessment: Faces Faces Pain Scale: No hurt Pain Type: Chronic pain Pain Location: Knee Pain Orientation: Right Pain Intervention(s): Repositioned  Therapy/Group: Individual Therapy   Maxcine HamLaura Paiewonsky, M.A. CCC-SLP [phone]   Maxcine Hamaiewonsky, Carlee Tesfaye 05/11/2013, 4:26 PM

## 2013-05-11 NOTE — Progress Notes (Signed)
Social Work Lucy Chrisebecca G Anushka Hartinger, LCSW Social Worker Signed  Patient Care Conference Service date: 05/11/2013 2:07 PM  Inpatient RehabilitationTeam Conference and Plan of Care Update Date: 05/11/2013   Time: 11;15 AM     Patient Name: [name] Capricedward A Stclair       Medical Record Number: [id number]   Date of Birth: 1951/01/09 Sex: Male         Room/Bed: 4W09C/4W09C-01 Payor Info: Payor: MEDICARE / Plan: MEDICARE PART A AND B / Product Type: *No Product type* /   Admitting Diagnosis: LT ICH   Admit Date/Time:  05/06/2013  6:42 PM Admission Comments: No comment available   Primary Diagnosis:  <principal problem not specified> Principal Problem: <principal problem not specified>    Patient Active Problem List     Diagnosis  Date Noted   .  probable undiagnosed OSA on CPAP  05/06/2013   .  Other and unspecified hyperlipidemia  05/06/2013   .  Hypokalemia  05/06/2013   .  Chews tobacco  05/06/2013   .  Dysphagia, secondary to stroke  05/06/2013   .  Malignant hypertension  05/02/2013   .  Acute respiratory distress  05/02/2013   .  Tobacco abuse  05/02/2013   .  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 Claudette LawsAndrew Kirsteins Social Worker Present: Dossie DerBecky Dennison Mcdaid, LCSW Nurse Present: Carlean PurlMaryann Barbour, RN PT Present: Edman CircleAudra Hall, PT;Emily Marya AmslerParcell, PT OT Present: Rosalio LoudSarah Hoxie, Felipa EthT;Kris Gellert, OT PPS Coordinator present : Tora DuckMarie Noel, RN, CRRN        Current Status/Progress  Goal  Weekly Team Focus   Medical     knee pain per sister has hx of gout, poor compliance with therapy, severe aphasia  improve cooperation  involve family to improve participation   Bowel/Bladder     Incontinent of bowel/bladder  Continent of bowel/bladder with max assist  Timed toileting q2hr.   Swallow/Nutrition/ Hydration     Dys 1 textures and honey thick liquids with full supervision, trials of  nectar thick liquids  least restrictive PO intake  diet tolerance, utilization of swallowing strategies, continue trials of nectar   ADL's     Pt is currently max assist for UB selfcare and total assist to total +2 for LB selfcare sit to stand.  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  Brunnstrum stage I to II in the right arm and hand.  Resistant at times to working with therapy.  Set at overall min assist level  selfcare retraining, sitting and standing balance, pt/family education, spatial awareness and self correction   Mobility     Total A for bed mobility, transfers and w/c mobility; gait not yet attempted; pt refusing multiple therapies  Min A overall w/c level  Transfers, increasing time OOB, sitting balance and trunk control   Communication     90% accurate with basic yes/no questions, Max cues for receptive/expressive verbal communication, primarily nonverbal communication  Min A with multimodal communication     Safety/Cognition/ Behavioral Observations    Max A sustained attention, overall impacted by communication  Min  sustained attention, continue to address with communication goals   Pain     No c/o pain  <3 on a scale of 0-10  Assess for pain and medicate prn   Skin     Bottom red but blachable  Skin to remain free from breakdown  Assess q  shift for skin breakdown     *See Care Plan and progress notes for long and short-term goals.    Barriers to Discharge:  see above      Possible Resolutions to Barriers:    see above      Discharge Planning/Teaching Needs:    Girlfriend can not provide physical care due to own health issues.  Plan at this time is NHP, need to talk with sister and brother also.      Team Discussion:    Participation lacking until family here and they plan to come more now, since he does better when here.  Y/N more accurate.  O2 night due to will not wear CPAP machine.  Will need 24 hr physical care, ques who will do this.    Revisions to Treatment Plan:    Confirm discharge plan-ques NHP    Continued Need for Acute Rehabilitation Level of Care: The patient requires daily medical management by a physician with specialized training in physical medicine and rehabilitation for the following conditions: Daily direction of a multidisciplinary physical rehabilitation program to ensure safe treatment while eliciting the highest outcome that is of practical value to the patient.: Yes Daily medical management of patient stability for increased activity during participation in an intensive rehabilitation regime.: Yes Daily analysis of laboratory values and/or radiology reports with any subsequent need for medication adjustment of medical intervention for : Neurological problems;Other  Lucy Chris 05/11/2013, 2:07 PM          Patient ID: [name], male   DOB: 09-21-1950, 63 y.o.   MRN: [id number]

## 2013-05-11 NOTE — Progress Notes (Signed)
Social Work Patient ID: [name], male   DOB: 08-16-1950, 63 y.o.   MRN: [id number] Met with pt and [name] along with her three daughter's who are here to observe in therapies and encourage him to participate. All inform they are not able to provide care to him at discharge and option is NHP.  But pt's sister or brother will need to give the final Decision since he is not married to [name] and his siblings are next of kin.  [name] would like him closer to her due to she will visit and make sure He is getting good care.  Will talk with sister or brother.

## 2013-05-11 NOTE — Progress Notes (Signed)
Subjective/Complaints: Aphasic not able to voice c/os Appears frustrated Review of Systems - not able to obtain secondary to aphasia Objective: Vital Signs: Blood pressure 150/92, pulse 68, temperature 98 F (36.7 C), temperature source Oral, resp. rate 18, height _0  (1.956 m), weight 85.004 kg (187 lb 6.4 oz), SpO2 97.00%. No results found. Results for orders placed during the hospital encounter of 05/06/13 (from the past 72 hour(s))  CBC WITH DIFFERENTIAL     Status: Abnormal   Collection Time    05/09/13  6:20 AM      Result Value Range   WBC 8.9  4.0 - 10.5 K/uL   RBC 5.45  4.22 - 5.81 MIL/uL   Hemoglobin 16.3  13.0 - 17.0 g/dL   HCT 48.3  39.0 - 52.0 %   MCV 88.6  78.0 - 100.0 fL   MCH 29.9  26.0 - 34.0 pg   MCHC 33.7  30.0 - 36.0 g/dL   RDW 12.7  11.5 - 15.5 %   Platelets 231  150 - 400 K/uL   Neutrophils Relative % 79 (*) 43 - 77 %   Neutro Abs 7.0  1.7 - 7.7 K/uL   Lymphocytes Relative 10 (*) 12 - 46 %   Lymphs Abs 0.9  0.7 - 4.0 K/uL   Monocytes Relative 10  3 - 12 %   Monocytes Absolute 0.9  0.1 - 1.0 K/uL   Eosinophils Relative 1  0 - 5 %   Eosinophils Absolute 0.1  0.0 - 0.7 K/uL   Basophils Relative 0  0 - 1 %   Basophils Absolute 0.0  0.0 - 0.1 K/uL  COMPREHENSIVE METABOLIC PANEL     Status: Abnormal   Collection Time    05/09/13  6:20 AM      Result Value Range   Sodium 148 (*) 137 - 147 mEq/L   Comment: Please note change in reference range.   Potassium 3.9  3.7 - 5.3 mEq/L   Comment: Please note change in reference range.   Chloride 107  96 - 112 mEq/L   CO2 23  19 - 32 mEq/L   Glucose, Bld 115 (*) 70 - 99 mg/dL   BUN 40 (*) 6 - 23 mg/dL   Creatinine, Ser 1.26  0.50 - 1.35 mg/dL   Calcium 9.7  8.4 - 10.5 mg/dL   Total Protein 7.8  6.0 - 8.3 g/dL   Albumin 2.8 (*) 3.5 - 5.2 g/dL   AST 33  0 - 37 U/L   ALT 70 (*) 0 - 53 U/L   Alkaline Phosphatase 95  39 - 117 U/L   Total Bilirubin 1.1  0.3 - 1.2 mg/dL   GFR calc non Af Amer 59 (*) >90 mL/min    GFR calc Af Amer 69 (*) >90 mL/min   Comment: (NOTE)     The eGFR has been calculated using the CKD EPI equation.     This calculation has not been validated in all clinical situations.     eGFR's persistently <90 mL/min signify possible Chronic Kidney     Disease.      Constitutional: He appears well-developed and well-nourished.  HENT:  Head: Normocephalic and atraumatic.  Eyes: Conjunctivae are normal. Pupils are equal, round, and reactive to light.  Neck: Normal range of motion. Neck supple.  Cardiovascular: Regular rhythm. Tachycardia present.  Respiratory: Effort normal and breath sounds normal. No respiratory distress. He has no wheezes.   GI: Soft. Bowel sounds are normal. He  exhibits no distension. There is no tenderness.  Neurological: He is alert.   Flat affect with delayed processing. Restless and distracted. Minimal verbal output. Dense right hemiparesis with sensory deficits. Moves LUE/LLE with visual/verbal cues. Unable to follow simple commands. RUE and RLE are 0/5. Does seem to have some gross pain sense in the right leg. Able to only state name, moderate dysarthria Skin: Skin is warm and dry    Assessment/Plan: 1. 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 Physiatrist is providing close team supervision and 24 hour management of active medical problems listed below. Physiatrist and rehab team continue to assess barriers to discharge/monitor patient progress toward functional and medical goals. Team conference today please see physician documentation under team conference tab, met with team face-to-face to discuss problems,progress, and goals. Formulized individual treatment plan based on medical history, underlying problem and comorbidities. FIM: FIM - Bathing Bathing Steps Patient Completed: Front perineal  area Bathing: 1: Two helpers  FIM - Upper Body Dressing/Undressing Upper body dressing/undressing steps patient completed: Put head through opening of pull over shirt/dress Upper body dressing/undressing: 0: Wears gown/pajamas-no public clothing FIM - Lower Body Dressing/Undressing Lower body dressing/undressing: 1: Two helpers  FIM - Toileting Toileting: 1: Total-Patient completed zero steps, helper did all 3  FIM - Air cabin crew Transfers: 0-Activity did not occur  FIM - Control and instrumentation engineer Devices: Arm rests Bed/Chair Transfer: 1: Supine > Sit: Total A (helper does all/Pt. < 25%);1: Two helpers  FIM - Locomotion: Wheelchair Locomotion: Wheelchair: 1: Total Assistance/staff pushes wheelchair (Pt<25%) FIM - Locomotion: Ambulation Ambulation/Gait Assistance: Not tested (comment) Locomotion: Ambulation: 0: Activity did not occur  Comprehension Comprehension Mode: Auditory Comprehension: 2-Understands basic 25 - 49% of the time/requires cueing 51 - 75% of the time  Expression Expression Mode: Nonverbal Expression: 2-Expresses basic 25 - 49% of the time/requires cueing 50 - 75% of the time. Uses single words/gestures.  Social Interaction Social Interaction: 2-Interacts appropriately 25 - 49% of time - Needs frequent redirection.  Problem Solving Problem Solving: 2-Solves basic 25 - 49% of the time - needs direction more than half the time to initiate, plan or complete simple activities  Memory Memory: 1-Recognizes or recalls less than 25% of the time/requires cueing greater than 75% of the time  Medical Problem List and Plan:  1. 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 DVT Prophylaxis/Anticoagulation: Subcutaneous Lovenox initiated 05/05/2013. Monitor platelet counts any signs of bleeding  3. Pain Management: Tylenol as needed  4. Neuropsych: This patient is not capable of making decisions on  his own behalf.  5. Dysphagia. Dysphagia 1 honey thick liquids. Monitor closely for any aspiration. Followup speech therapy  6. Hypertension.Uncontrolled with tachycardia Norvasc 10 mg daily, hydrochlorothiazide 25 mg daily. Monitor with increased mobility, titrate BB 7. OSA. CPAP. Will follow pulmonary services as needed    LOS (Days) 5 A FACE TO FACE EVALUATION WAS PERFORMED  KIRSTEINS,ANDREW E 05/11/2013, 9:51 AM

## 2013-05-11 NOTE — Progress Notes (Signed)
Physical Therapy Session Note  Patient Details  Name: [name] MRN: [id number] Date of Birth: [birth year]  Today's Date: 05/11/2013 Time: 1400 (full time 1330-1420 cotreat with OT)-1420 Time Calculation (min): 20 min  Short Term Goals: Week 1:  PT Short Term Goal 1 (Week 1): Patient will be able to perform bed mobility with Mod-assist. PT Short Term Goal 2 (Week 1): Patient will be able to perform transfers with Max-assist. PT Short Term Goal 3 (Week 1): Patient will be able to perform dynamic sitting balance with Mod-assist. PT Short Term Goal 4 (Week 1): Patient will be able to propel w/c 8350' with Mod-assist.  Skilled Therapeutic Interventions/Progress Updates:   PT/OT cotreat with focus on trunk/postural control, transfers and functional mobility/gait.  Pt asleep in bed attempting to wave therapists away.  Pt transferred supine > sit with total A of OT and max encouragement.  Performed squat scooting and squat pivots bed > w/c > mat to R and L with total A to maintain trunk position and minimize pushing during transfer.  Once on mat focus treatment on pt awareness and self correction of trunk position to midline with use of mirror and therapist on L side as visual feedback and target.  Pt unable to maintain attention to mirror.  Changed to functional reaching and tool sorting task for more active/dynamic trunk control, sitting balance and correction of R lateropulsion by reaching forwards and to the L to place tools in storage bin.  Pt very restless and appeared anxious during task.  Transitioned to sit > stand and performed gait with +2A (3 Musketeers style) x 20' with assistance for lateral and anterior weight shifting to fully shift weight to LLE in stance and assistance for trunk elongation and stabilization of RLE in stance.  Pt able to initiate advancement of RLE but unable to fully advance.  After rest break perform stand pivot to w/c with +2 total A (3 musketeers style) with verbal cues  for sequencing and manual facilitation for weight shifting.  Pt placed at RN station to rest before Speech therapy session.  Therapy Documentation Precautions:  Precautions Precautions: Fall Precaution Comments: R sided inattention,Dense Right hemiparesis,  pusher toward Right, global aphasia deficits Restrictions Weight Bearing Restrictions: No Pain: Pain Assessment Pain Assessment: Faces Faces Pain Scale: No hurt Pain Location: Knee Pain Orientation: Right Pain Intervention(s): Repositioned Locomotion : Ambulation Ambulation/Gait Assistance: 1: +2 Total assist   See FIM for current functional status  Therapy/Group: Individual Therapy and Co-Treatment  [name], Tere Mcconaughey Advanced Pain ManagementFaucette 05/11/2013, 2:46 PM

## 2013-05-11 NOTE — Progress Notes (Signed)
Occupational Therapy Session Note  Patient Details  Name: [name] MRN: [id number] Date of Birth: 01/06/1951  Today's Date: 05/11/2013 Time: 1330-1400 Time Calculation (min): 30 min  Short Term Goals: Week 1:  OT Short Term Goal 1 (Week 1): Pt will sit statically EOB with close supervision for 5 mins in preparation for selfcare tasks. OT Short Term Goal 2 (Week 1): Pt will perform UB bathing with min assist and mod demonstrational cueing supported in wheelchair. OT Short Term Goal 3 (Week 1): Pt will perform LB bathing sit to stand with total assist pt 60 %. OT Short Term Goal 4 (Week 1): Pt will perform toilet transfer with max assist to the right side. OT Short Term Goal 5 (Week 1): Pt/family will return demonstrate safe performance of PROM/AAROM exercises for the RUE.  Skilled Therapeutic Interventions/Progress Updates:    Pt transferred supine to sit EOB with total assist and max demonstrational cueing for technique including RUE management and RLE management.  Once in sitting pt with increased bias to the right side with increased pushing the same direction.  Transferred to the wheelchair with total assist +2 pt 40% going to the right side, using Bobath over the back technique.  Rolled pt down to the gym and transferred to the therapy mat, going to the left side with total assist using same previous technique.  In sitting pt with greater weightbearing over right hip and increased lean. Worked on having pt bring his left shoulder toward therapist sitting on his left side to help center weightbearing and decrease pushing.  Pt unable to perform weightshift to the left and maintain for more than a couple of seconds.  Attempted to have pt work on sorting tools placed on bedside table and place them in container placed on his left side to promote forward and lateral weightshift to the right.  Pt with decreased initiation to participate and needed hand over hand assist for the first couple  attempted and then was unable to grasp concept of placing them in the container.  Noted pt with increased shaking in his left arm at times as well as pt placing his left hand on his forehead and attempting to prop on the bedside table.  Unsure if response was secondary to anxiety, fatigue, or behavioral.   Transitioned session to more automatic functional task of standing and attempted mobility to make pt more engaged.  Pt performed sit to stand with "3 muskateers" format with total +2 pt (40%).  Pt ambulated approximately 20 ft with +2 assist (pt 25%) with therapist having to advance the RLE with each step and stabilize the right knee and trunk in stance phase when stepping with the left.  Pt taking short steps on the left side as well secondary to not being able to un weight the foot adequately for larger stepping.  Pt transferred back to the wheelchair with stand step as well with +2 and was taken to the nurses station for safety.  Half lap tray and orange safety belt also in place.  Therapy Documentation Precautions:  Precautions Precautions: Fall Precaution Comments: R sided inattention,Dense Right hemiparesis,  pusher toward Right, global aphasia deficits Restrictions Weight Bearing Restrictions: No  Pain: Pain Assessment Pain Assessment: Faces Faces Pain Scale: No hurt Pain Type: Chronic pain Pain Location: Knee Pain Orientation: Right Pain Intervention(s): Repositioned ADL: See FIM for current functional status  Therapy/Group: Co-Treatment  Ayianna Darnold OTR/L 05/11/2013, 3:41 PM

## 2013-05-11 NOTE — Progress Notes (Signed)
Occupational Therapy Session Note  Patient Details  Name: [name] MRN: [id number] Date of Birth: 1950-10-05  Today's Date: 05/11/2013 Time: 1040-1110 Time Calculation (min): 30 min  Short Term Goals: Week 1:  OT Short Term Goal 1 (Week 1): Pt will sit statically EOB with close supervision for 5 mins in preparation for selfcare tasks. OT Short Term Goal 2 (Week 1): Pt will perform UB bathing with min assist and mod demonstrational cueing supported in wheelchair. OT Short Term Goal 3 (Week 1): Pt will perform LB bathing sit to stand with total assist pt 60 %. OT Short Term Goal 4 (Week 1): Pt will perform toilet transfer with max assist to the right side. OT Short Term Goal 5 (Week 1): Pt/family will return demonstrate safe performance of PROM/AAROM exercises for the RUE.  Skilled Therapeutic Interventions/Progress Updates:    Pt worked on bathing and dressing sitting EOB during session with PT co-tx.  Pt resistant initially to participating but eventually accepted working in therapy.  Static sitting EOB initially with min guard assist with pt stabilizing his LUE on the bed rail.  Dynamically pt still needing max to total assist for balance while working on washing.  Pt needed max demonstrational cueing to initiate and perform all bathing.  Therapist having to present washcloth to pt's hand and give him cueing to wash body parts.  At times therapist placed the washcloth on his body part and gave him the cueing to wash.  Still with significant pushing to the right side in sitting.  Also with occasional LOB backwards as well. Frequently during session had pt lean on PT on his left to avoid pushing and reset his sitting balance.  Needed total assist +2 pt 50% for sit to stand and standing when performing peri washing and pulling pants over hips.  Pt took 4-5 steps to the wheelchair at end of session with total assist + 2 pt 20%.  Pt's girlfriend and her daughters present at end of session.   Discussed pt's limited participation in therapies over the past few days and how it may be beneficial for family to be present to help encourage his participation.  Pt with no active movement noted during session in the RUE and RLE.    Therapy Documentation Precautions:  Precautions Precautions: Fall Precaution Comments: R sided inattention,Dense Right hemiparesis,  pusher toward Right, global aphasia deficits Restrictions Weight Bearing Restrictions: No  Pain: Pain Assessment Pain Assessment: Faces Faces Pain Scale: Hurts a little bit Pain Location: Knee Pain Orientation: Right Pain Intervention(s): Repositioned ADL: See FIM for current functional status  Therapy/Group: Co-tx with PT  Phyllicia Dudek OTR/L 05/11/2013, 11:28 AM

## 2013-05-11 NOTE — Progress Notes (Signed)
Physical Therapy Session Note  Patient Details  Name: [name] MRN: [id number] Date of Birth: January 06, 1951  Today's Date: 05/11/2013 Time: 1040-1110 Time Calculation (min): 30 min  Short Term Goals: Week 1:  PT Short Term Goal 1 (Week 1): Patient will be able to perform bed mobility with Mod-assist. PT Short Term Goal 2 (Week 1): Patient will be able to perform transfers with Max-assist. PT Short Term Goal 3 (Week 1): Patient will be able to perform dynamic sitting balance with Mod-assist. PT Short Term Goal 4 (Week 1): Patient will be able to propel w/c 3650' with Mod-assist.  Skilled Therapeutic Interventions/Progress Updates:   Pt received sitting at EOB with OT in room.  Joined session for skilled co-treat with OT in order to perform ADL retraining and unsupported sitting balance at EOB, as well as several reps of standing to adjust clothing.  Performed sitting balance at varying levels of assist from min assist to total, depending on task and amount of UE support, as well as pusher tendencies.  Note he was intermittently able to obtain midline in sitting, however only for several secs at a time due to increased pusher tendencies and need to use LUE/LE to push to R side.  When pt lifting RUE to perform bathing task, note increased posterior and R lateral lean, however he was able to intermittently correct with verbal and visual cues "lean towards me" with PT sitting at pts L side.  Performed 3 reps of standing with +2 assist with OT assisting with stand and PT assisting with peri care/cleaning and adjusting clothing.  When pt demonstrates increased pushing, had pt lean to L elbow for several secs to 'reset' posture, again he was unable to hold longer than several secs before pushing again to the R.  See OT note for further details on bathing/dressing details.  At end of session, set up w/c approx 5' from bed in order to ambulate to w/c.  Performed gait "three muskateer style" with OT assisting to  advance RLE and to assist maintaining upright posture.  Returned pt to w/c with R half lap tray and quick release belt.  Pts girlfriend and her daughters arrived at end of session.  Discussed pts resistance to participate in therapy, deficits from pts stroke and importance of participating to get stronger.  Educated family to begin coming to more therapy sessions in order to increase pts participation.  Note when family came into room, pts overall attitude did change for the better.  Continue to feel that pt will require SN[facility] however girlfriend states that she cannot perform any lifting due to previous back injuries.  Family verbalized understanding and plan to attend therapy sessions as much as possible.   Therapy Documentation Precautions:  Precautions Precautions: Fall Precaution Comments: R sided inattention,Dense Right hemiparesis,  pusher toward Right, global aphasia deficits Restrictions Weight Bearing Restrictions: No General: Amount of Missed PT Time (min): 45 Minutes Missed Time Reason: Patient unwilling/refused to participate without medical reason (joined in OT session for co-treat)   Pain: Pain Assessment Pain Assessment: Faces Faces Pain Scale: Hurts a little bit Pain Location: Knee Pain Orientation: Right Pain Intervention(s): Repositioned   Locomotion : Ambulation Ambulation/Gait Assistance: 1: +2 Total assist ("three muskateer style")   See FIM for current functional status  Therapy/Group: Co-Treatment (with OT)  Sorayah Schrodt, Meribeth MattesEmily Ann 05/11/2013, 11:29 AM

## 2013-05-11 NOTE — Progress Notes (Signed)
Pt continues to grimace when rt knee repositioned, tender to touch.  GF states pt. does have H/O rt knee and hand tenderness and edema but " would not see a doctor about it." MD aware, orders received.  1 Vicodin given; monitor.

## 2013-05-11 NOTE — Patient Care Conference (Signed)
Inpatient RehabilitationTeam Conference and Plan of Care Update Date: 05/11/2013   Time: 11;15 AM    Patient Name: [name]      Medical Record Number: [id number]  Date of Birth: [birth year] Sex: Male         Room/Bed: 4W09C/4W09C-01 Payor Info: Payor: MEDICARE / Plan: MEDICARE PART A AND B / Product Type: *No Product type* /    Admitting Diagnosis: LT ICH  Admit Date/Time:  05/06/2013  6:42 PM Admission Comments: No comment available   Primary Diagnosis:  <principal problem not specified> Principal Problem: <principal problem not specified>  Patient Active Problem List   Diagnosis Date Noted  . probable undiagnosed OSA on CPAP 05/06/2013  . Other and unspecified hyperlipidemia 05/06/2013  . Hypokalemia 05/06/2013  . Chews tobacco 05/06/2013  . Dysphagia, secondary to stroke 05/06/2013  . Malignant hypertension 05/02/2013  . Acute respiratory distress 05/02/2013  . Tobacco abuse 05/02/2013  . 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 Claudette LawsAndrew Kirsteins Social Worker Present: Dossie DerBecky Amarii Amy, LCSW Nurse Present: Carlean PurlMaryann Barbour, RN PT Present: Edman CircleAudra Hall, PT;Emily Marya AmslerParcell, PT OT Present: Rosalio LoudSarah Hoxie, Felipa EthT;Kris Gellert, OT PPS Coordinator present : Tora DuckMarie Noel, RN, CRRN     Current Status/Progress Goal Weekly Team Focus  Medical   knee pain per sister has hx of gout, poor compliance with therapy, severe aphasia  improve cooperation  involve family to improve participation   Bowel/Bladder   Incontinent of bowel/bladder  Continent of bowel/bladder with max assist  Timed toileting q2hr.   Swallow/Nutrition/ Hydration   Dys 1 textures and honey thick liquids with full supervision, trials of nectar thick liquids  least restrictive PO intake  diet tolerance, utilization of swallowing strategies, continue trials of nectar   ADL's   Pt is currently max  assist for UB selfcare and total assist to total +2 for LB selfcare sit to stand.  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  Brunnstrum stage I to II in the right arm and hand.  Resistant at times to working with therapy.  Set at overall min assist level  selfcare retraining, sitting and standing balance, pt/family education, spatial awareness and self correction   Mobility   Total A for bed mobility, transfers and w/c mobility; gait not yet attempted; pt refusing multiple therapies  Min A overall w/c level  Transfers, increasing time OOB, sitting balance and trunk control   Communication   90% accurate with basic yes/no questions, Max cues for receptive/expressive verbal communication, primarily nonverbal communication  Min A with multimodal communication      Safety/Cognition/ Behavioral Observations  Max A sustained attention, overall impacted by communication  Min  sustained attention, continue to address with communication goals   Pain   No c/o pain  <3 on a scale of 0-10  Assess for pain and medicate prn   Skin   Bottom red but blachable  Skin to remain free from breakdown  Assess q shift for skin breakdown      *See Care Plan and progress notes for long and short-term goals.  Barriers to Discharge: see above    Possible Resolutions to Barriers:  see above    Discharge Planning/Teaching Needs:  Girlfriend can not provide physical care due to own health issues.  Plan at this time is NHP, need to talk with sister and brother also.      Team Discussion:  Participation lacking until family here and they plan to come more now, since he does better when here.  Y/N more accurate.  O2 night due to will not wear CPAP machine.  Will need 24 hr physical care, ques who will do this.  Revisions to Treatment Plan:  Confirm discharge plan-[facility]   Continued Need for Acute Rehabilitation Level of Care: The patient requires daily medical management by a physician with  specialized training in physical medicine and rehabilitation for the following conditions: Daily direction of a multidisciplinary physical rehabilitation program to ensure safe treatment while eliciting the highest outcome that is of practical value to the patient.: Yes Daily medical management of patient stability for increased activity during participation in an intensive rehabilitation regime.: Yes Daily analysis of laboratory values and/or radiology reports with any subsequent need for medication adjustment of medical intervention for : Neurological problems;Other  Nickolette Espinola, [name] 05/11/2013, 2:07 PM

## 2013-05-12 ENCOUNTER — Ambulatory Visit (HOSPITAL_COMMUNITY): Payer: Medicare Other | Admitting: Rehabilitation

## 2013-05-12 ENCOUNTER — Encounter (HOSPITAL_COMMUNITY): Payer: Medicare Other | Admitting: Occupational Therapy

## 2013-05-12 ENCOUNTER — Ambulatory Visit (HOSPITAL_COMMUNITY): Payer: Medicare Other | Admitting: Occupational Therapy

## 2013-05-12 ENCOUNTER — Inpatient Hospital Stay (HOSPITAL_COMMUNITY): Payer: Medicare Other | Admitting: Speech Pathology

## 2013-05-12 DIAGNOSIS — G8110 Spastic hemiplegia affecting unspecified side: Secondary | ICD-10-CM

## 2013-05-12 DIAGNOSIS — I69920 Aphasia following unspecified cerebrovascular disease: Secondary | ICD-10-CM

## 2013-05-12 DIAGNOSIS — I619 Nontraumatic intracerebral hemorrhage, unspecified: Secondary | ICD-10-CM

## 2013-05-12 MED ORDER — CELECOXIB 200 MG PO CAPS
200.0000 mg | ORAL_CAPSULE | Freq: Two times a day (BID) | ORAL | Status: AC
  Administered 2013-05-12 – 2013-05-16 (×10): 200 mg via ORAL
  Filled 2013-05-12 (×11): qty 1

## 2013-05-12 MED ORDER — ALPRAZOLAM 0.25 MG PO TABS: 0.2500 mg | ORAL_TABLET | Freq: Three times a day (TID) | ORAL | Status: DC | PRN

## 2013-05-12 NOTE — Progress Notes (Signed)
Occupational Therapy Session Note  Patient Details  Name: [name] MRN: [id number] Date of Birth: [birth year]  Today's Date: 05/12/2013 Time: 0901-1000 Time Calculation (min): 59 min  Session 2: Time:  11:30-12:00 Time Calculation (min):  30 mins  Short Term Goals: Week 1:  OT Short Term Goal 1 (Week 1): Pt will sit statically EOB with close supervision for 5 mins in preparation for selfcare tasks. OT Short Term Goal 2 (Week 1): Pt will perform UB bathing with min assist and mod demonstrational cueing supported in wheelchair. OT Short Term Goal 3 (Week 1): Pt will perform LB bathing sit to stand with total assist pt 60 %. OT Short Term Goal 4 (Week 1): Pt will perform toilet transfer with max assist to the right side. OT Short Term Goal 5 (Week 1): Pt/family will return demonstrate safe performance of PROM/AAROM exercises for the RUE.  Skilled Therapeutic Interventions/Progress Updates:    Pt worked on bathing and dressing during morning session, sitting EOB.  He initially needed max instructional cueing to participate secondary to agitation.  Sat EOB with overall max assist during bathing.  Needed initial hand over hand assistance to begin washing face and was also resistant to participating.  Pt with increased lean to the right in sitting and needs max assist to maintain balance while performing UB and LB bathing.  Pt attempts to verbalize but unfortunately cannot get out the words when therapist asks him questions.  Occasionally he can state one word phrases such as "No".  Pt needed total +2 (pt 40%) for sit to stand during peri washing and pulling pants over hips.  Transferred to wheelchair with total assist squat pivot (Bobath) method to the right side.  Finished session by having pt work on brushing his teeth at the sink from wheelchair level.  Pt needed max demonstrational cueing to sequence for thoroughness but was able to initiate brushing with min instructional cueing and therapist  handing him the tooth brush.  Pt still with increased pushing to the right side throughout session in standing and sitting.    Session 2: Co-tx with PT during session.  Transferred to therapy mat in the gym with total assist +2 (pt 40%) to the right side using the Bobath method.  In sitting pt demonstrating increased pushing to the right side.  Pt's family present during session as well.  Pt still with increased anxiety and resistance to attempting initial tasks with therapist.  Needed max demonstrational cueing initially to work on placing clothespins on vertical pole using his LUE.  Vertical pole placed forward and left of pt to help promote weight shift to the left and help decrease pushing.  Pt eventually was able to repeat placing clothespins with only mod instructional cueing after a few repetitions.  Pt still grew frustrated throughout session as therapists attempted to have him correct his balance, shift weight toward his left side, or perform reaching activity.  Family present did not have any affect on motivating him to participate even though they were encouraging him to try and that he was doing good.  Utlized Sara Plus for sit to stand and sustained standing during session as well.  Pt demonstrating increased posterior lean and pushing to the right side in standing as well.  Able to perform 2 intervals of standing with the [facility] for 5 mins and 2 mins.  Finished session with "3 muskateers" ambulation to the wheelchair which was placed approximately 10 ft away.  Pt with decreased ability to  maintain upright trunk and posture.  Did not note any attempts to activate the RLE with advancing the LE or with stance phase.    Therapy Documentation Precautions:  Precautions Precautions: Fall Precaution Comments: R sided inattention,Dense Right hemiparesis,  pusher toward Right, global aphasia deficits Restrictions Weight Bearing Restrictions: No  Pain: Pain Assessment Pain Assessment: Faces Pain  Score: 0-No pain ADL: See FIM for current functional status  Therapy/Group: Individual Therapy  Salil Raineri OTR/L 05/12/2013, 12:22 PM

## 2013-05-12 NOTE — Progress Notes (Signed)
SLP Cancellation Note  Patient Details Name: [name] MRN: [id number] DOB: 20-Oct-1950   Cancelled treatment:        Patient missed 45 minutes of skilled SLP service due to decreased level of arousal in bed and unwillingness to sit up.  When SLP attempted to raise head of bed patient swatted at SLP and shook head no to all SLP requests.  Will Follow up tomorrow.  Fae PippinMelissa Leeasia Secrist, M.A., CCC-SLP [phone]  Ayush Boulet 05/12/2013, 4:00 PM

## 2013-05-12 NOTE — Progress Notes (Signed)
Pt has VERY poor appetite;  much encouragement, cueing, offered variety of foods as diet appropriate , few bites of each meal only, poor po fluids ( IVF's as ordered.)  Continues to grimace with repositioning  right leg, right hand. 1 Vicodin x 2 today.

## 2013-05-12 NOTE — Progress Notes (Signed)
Occupational Therapy Session Note  Patient Details  Name: [name] MRN: [id number] Date of Birth: Jun 21, 1950  Today's Date: 05/12/2013 Time: [id number] Time Calculation (min): 15 min  Skilled Therapeutic Interventions/Progress Updates:    Pt performed bed mobility to the left side of the bed with total assist including rolling and sidelying to sit EOB.  Pt initially resistant to sitting EOB but eventually therapist was able to coax him into participating.  Pt sat EOB statically with close supervision for 5 mins.  Worked on donning shirt sitting EOB with therapist assisting with placing the RUE in the right arm hole.  Pt then worked on donning left sleeve with mod assist.  Pt was unable to sequence pulling shirt over head and then over trunk so needed mod facilitation to complete.  Pt with increased pushing to the right during task and with static sitting but much less compared to this morning's session.   Transitioned to working on left lateral reaching while sitting EOB.  Pt using the LUE to retrieve playing cards from his bedside table, placed strategically to help reinforce lateral weightshift to the left and reduce amount of pushing he had been performing.  Pt initially resistant but then began to participate with initial mod demonstrational cueing.  Finished session by having pt stand at bedside with total assist +2 (pt 40%) and take 2 small steps up to the top of the bed.  Pt with decreased ability to take adequate steps in the LLE secondary to not being able to unweight his foot.  He needed total assist to adduct the RLE as well.    Therapy Documentation Precautions:  Precautions Precautions: Fall Precaution Comments: R sided inattention,Dense Right hemiparesis,  pusher toward Right, global aphasia deficits Restrictions Weight Bearing Restrictions: No  Pain: Pain Assessment Pain Assessment: Faces Faces Pain Scale: Hurts a little bit Pain Type: Chronic pain Pain Location: Knee Pain  Orientation: Right ADL: See FIM for current functional status  Therapy/Group: Co-tx with PT  Tosha Belgarde OTR/L 05/12/2013, 3:36 PM

## 2013-05-12 NOTE — Progress Notes (Signed)
Social Work Patient ID: [name] CapriceEdward A [name], male   DOB: [birth year], 63 y.o.   MRN: [id number] Spoke with pt's sister-Sandy to discuss team conference and what the plan is at discharge.  She reports: " With the severity of the stroke he will need nursing home, then hopefully home." She is aware of [name]'s health issues and just hopes she and her daughter's will stay involved and supportive.  They have expressed they would and visit more often pt assist with his Participation.  She and her brother will be here Sun-Tues will touch base then to discuss progress and NH options.

## 2013-05-12 NOTE — Progress Notes (Signed)
Physical Therapy Note  Patient Details  Name: [name] MRN: [id number] Date of Birth: [birth year] Today's Date: 05/12/2013 1345-1400 15 min co-treat with OT for skilled treatment  Pt sat bedside with bil feet supported, requiring intermittent tactile cues for trunk activation for midline orientation, statically and dynamically during donning T shirt.  Dynamic sitting balance reaching out of BOS with L hand, for playing cards.  Pt demonstrated interest in the cards and attended to this task x 5 cards x 4.    Sit> stand with assist of 1 person, stepping L several steps +2 assist, to position nearer [facility].  Sit> supine with total assist.  Pt positioned for comfort with RUE on pillow.  [name] 05/12/2013, 4:27 PM

## 2013-05-12 NOTE — Progress Notes (Signed)
Physical Therapy Session Note  Patient Details  Name: [name] A Keithly MRN: [id number] Date of Birth: 09/09/1950  Today's Date: 05/12/2013 Time: 1100 (1100-1200 total w/ cotx)-1130 Time Calculation (min): 30 min  Short Term Goals: Week 1:  PT Short Term Goal 1 (Week 1): Patient will be able to perform bed mobility with Mod-assist. PT Short Term Goal 2 (Week 1): Patient will be able to perform transfers with Max-assist. PT Short Term Goal 3 (Week 1): Patient will be able to perform dynamic sitting balance with Mod-assist. PT Short Term Goal 4 (Week 1): Patient will be able to propel w/c 6450' with Mod-assist.  Skilled Therapeutic Interventions/Progress Updates:   Skilled co-treatment with OT during session to address sitting balance, decreasing pusher tendencies and standing/gait activities.  Pt received sitting at nursing station this am, not necessarily agreeable to therapy, however did not resist therapist assisting him to gym.  Family arrived in hallway on way to gym and attended first half of session to provide encouragement and motivation during session.  Performed squat pivot transfer vis Bobath method w/c to mat at +2 assist (pt assist 40%) with max cues for leaning into therapists R hip for increased forward and lateral weight shift to clear buttocks during transfer.  Also provided assist at R knee to prevent buckle and to increase WB through extremity during transfer.  [facility], focused on reaching activities to the L to decrease pusher tendencies with mirror placed in front of pt for increased visual feedback.  Pt initially very resistant to participate, however did complete several reps of placing yellow clothespins on pin rod to the L.  Also had pt push OT away to the L, however pt only able to perform a few secs at a time due to increased anxiety and agitation with therapists.  Progressed to performing standing activity with stairs to pts L to use for UE support and "pull" on.  Requires +2  assist to maintain standing with PT blocking R knee to prevent buckle and to provide feedback for increased quad activation.  Note there are times that pt able to maintain flexed knee, however not buckle.  Most of time without support, pts knee does buckle.  Performed standing in [employer] for increased UE support with therapist blocking R knee to allow pt to step forward and backward with LLE to increase weight shifting and weight bearing through RLE.  Provided mod to max tactile and manual cues for upright posture and chest and back, as well as at hips.  Ended session with approx 15' of gait with +2 "three muskateer style" assist to w/c with assist to advance RLE (however pt slightly initiating hip flex) and at R knee during R stance to prevent buckling.  Pt also requires assist at L knee, as that knee would buckle and pt would attempt to "hop" on extremity.  Pt returned to w/c with quick release belt on and with half lap tray. Assisted back to room with family with all needs in reach and educated family to call nursing if family leaves for increased pt safety.    Therapy Documentation Precautions:  Precautions Precautions: Fall Precaution Comments: R sided inattention,Dense Right hemiparesis,  pusher toward Right, global aphasia deficits Restrictions Weight Bearing Restrictions: No   Pain: Pain Assessment Pain Assessment: Faces Pain Score: 0-No pain   Locomotion : Ambulation Ambulation/Gait Assistance: 1: +2 Total assist   See FIM for current functional status  Therapy/Group: Co-Treatment w/ OT  Vista DeckParcell, Syesha Thaw Ann 05/12/2013, 12:42  PM  

## 2013-05-12 NOTE — Progress Notes (Addendum)
Subjective/Complaints: Aphasic but spontaneously stated "light on during day, light off night" Discussed need for full therapy participation Review of Systems - not able to obtain secondary to aphasia Objective: Vital Signs: Blood pressure 156/86, pulse 68, temperature 98.2 F (36.8 C), temperature source Oral, resp. rate 18, height 6\' 5"  (1.956 m), weight 85.004 kg (187 lb 6.4 oz), SpO2 96.00%. Dg Knee 1-2 Views Right  05/11/2013   CLINICAL DATA:  Right knee pain, altered mental status.  EXAM: RIGHT KNEE - 1-2 VIEW  COMPARISON:  Portable views of the right knee dated May 07, 2012.  FINDINGS: There remains soft tissue swelling in the suprapatellar region which may reflect an underlying joint effusion. There is no evidence of an acute fracture nor dislocation of the right knee. Very mild degenerative change is present manifested by beaking of the tibial spines and tiny spurs from the lateral femoral condyle. The patella appears normally positioned. There is no evidence of soft tissue gas.  IMPRESSION: 1. A joint effusion is likely present. 2. There are mild degenerative changes of the right knee. 3. There is no evidence of an acute fracture nor dislocation.   Electronically Signed   By: David  [place]   On: 05/11/2013 17:03   Results for orders placed during the hospital encounter of 05/06/13 (from the past 72 hour(s))  URIC ACID     Status: Abnormal   Collection Time    05/11/13 12:20 PM      Result Value Range   Uric Acid, Serum 11.3 (*) 4.0 - 7.8 mg/dL      Constitutional: He appears well-developed and well-nourished.  HENT:  Head: Normocephalic and atraumatic.  Eyes: Conjunctivae are normal. Pupils are equal, round, and reactive to light.  Neck: Normal range of motion. Neck supple.  Cardiovascular: Regular rhythm. Tachycardia present.  Respiratory: Effort normal and breath sounds normal. No respiratory distress. He has no wheezes.   GI: Soft. Bowel sounds are normal. He exhibits no  distension. There is no tenderness.  Neurological: He is alert.   Flat affect with delayed processing. Restless and distracted. Minimal verbal output. Dense right hemiparesis with sensory deficits. Moves LUE/LLE with visual/verbal cues. Unable to follow simple commands. RUE and RLE are 0/5. Does seem to have some gross pain sense in the right leg. Able to only state name, moderate dysarthria Skin: Skin is warm and dry  MSK R knee effusion but not painful to palp   Assessment/Plan: 1. 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 Physiatrist is providing close team supervision and 24 hour management of active medical problems listed below. Physiatrist and rehab team continue to assess barriers to discharge/monitor patient progress toward functional and medical goals.  FIM: FIM - Bathing Bathing Steps Patient Completed: Chest;Abdomen;Right Arm;Left upper leg;Right lower leg (including foot) Bathing: 1: Two helpers  FIM - Upper Body Dressing/Undressing Upper body dressing/undressing steps patient completed: Put head through opening of pull over shirt/dress Upper body dressing/undressing: 2: Max-Patient completed 25-49% of tasks FIM - Lower Body Dressing/Undressing Lower body dressing/undressing steps patient completed: Thread/unthread left pants leg Lower body dressing/undressing: 1: Two helpers  FIM - Toileting Toileting: 1: Two helpers  FIM - Archivist Transfers: 1-Two helpers  FIM - Banker Devices: Arm rests Bed/Chair Transfer: 1: Two helpers;1: Supine > Sit: Total A (helper does all/Pt. < 25%)  FIM - Locomotion: Wheelchair  Locomotion: Wheelchair: 1: Total Assistance/staff pushes wheelchair (Pt<25%) FIM - Locomotion: Ambulation Ambulation/Gait Assistance: 1: +2 Total  assist Locomotion: Ambulation: 1: Two helpers  Comprehension Comprehension Mode: Auditory Comprehension: 2-Understands basic 25 - 49% of the time/requires cueing 51 - 75% of the time  Expression Expression Mode: Nonverbal Expression: 2-Expresses basic 25 - 49% of the time/requires cueing 50 - 75% of the time. Uses single words/gestures.  Social Interaction Social Interaction: 2-Interacts appropriately 25 - 49% of time - Needs frequent redirection.  Problem Solving Problem Solving: 2-Solves basic 25 - 49% of the time - needs direction more than half the time to initiate, plan or complete simple activities  Memory Memory: 1-Recognizes or recalls less than 25% of the time/requires cueing greater than 75% of the time  Medical Problem List and Plan:  1. 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  Doppler no carotid stenosis 2. DVT Prophylaxis/Anticoagulation: Subcutaneous Lovenox initiated 05/05/2013. Monitor platelet counts any signs of bleeding  3. Pain Management: Tylenol as needed  4. Neuropsych: This patient is not capable of making decisions on his own behalf.  5. Dysphagia. Dysphagia 1 honey thick liquids. Monitor closely for any aspiration. Followup speech therapy  6. Hypertension.Uncontrolled with tachycardia Norvasc 10 mg daily, hydrochlorothiazide 25 mg daily. Monitor with increased mobility, titrate BB 7. OSA. CPAP. Will follow pulmonary services as needed  8.  R knee effusion mild OA, elevated urate, possible hx gout noted by sister, pt unable to provide hx, will treat with Cox 2   LOS (Days) 6 A FACE TO FACE EVALUATION WAS PERFORMED  Brittiny Levitz E 05/12/2013, 9:26 AM

## 2013-05-13 ENCOUNTER — Inpatient Hospital Stay (HOSPITAL_COMMUNITY): Payer: Medicare Other | Admitting: Speech Pathology

## 2013-05-13 ENCOUNTER — Encounter (HOSPITAL_COMMUNITY): Payer: Medicare Other | Admitting: Occupational Therapy

## 2013-05-13 ENCOUNTER — Ambulatory Visit (HOSPITAL_COMMUNITY): Payer: Medicare Other | Admitting: Rehabilitation

## 2013-05-13 ENCOUNTER — Ambulatory Visit (HOSPITAL_COMMUNITY): Payer: Medicare Other | Admitting: Occupational Therapy

## 2013-05-13 LAB — CREATININE, SERUM
CREATININE: 1.17 mg/dL (ref 0.50–1.35)
GFR calc Af Amer: 75 mL/min — ABNORMAL LOW (ref >90)
GFR calc non Af Amer: 65 mL/min — ABNORMAL LOW (ref >90)

## 2013-05-13 MED ORDER — ALPRAZOLAM 0.25 MG PO TABS
0.2500 mg | ORAL_TABLET | Freq: Three times a day (TID) | ORAL | Status: DC
  Administered 2013-05-13 – 2013-06-06 (×68): 0.25 mg via ORAL
  Filled 2013-05-13 (×71): qty 1

## 2013-05-13 MED ORDER — MIRTAZAPINE 15 MG PO TBDP
15.0000 mg | ORAL_TABLET | Freq: Every day | ORAL | Status: DC
  Administered 2013-05-13 – 2013-05-21 (×4): 15 mg via ORAL
  Filled 2013-05-13 (×11): qty 1

## 2013-05-13 NOTE — Progress Notes (Signed)
Physical Therapy Session Note  Patient Details  Name: [name] MRN: [id number] Date of Birth: 1951/02/17  Today's Date: 05/13/2013 Time: [id number] Time Calculation (min): 30 min  Short Term Goals: Week 1:  PT Short Term Goal 1 (Week 1): Patient will be able to perform bed mobility with Mod-assist. PT Short Term Goal 2 (Week 1): Patient will be able to perform transfers with Max-assist. PT Short Term Goal 3 (Week 1): Patient will be able to perform dynamic sitting balance with Mod-assist. PT Short Term Goal 4 (Week 1): Patient will be able to propel w/c 5950' with Mod-assist.  Skilled Therapeutic Interventions/Progress Updates:   Pt received sitting at nursing station and demonstrates increased resistance when therapist attempting to place LEs on leg rests, however eventually allowed therapist to place them on leg rests.  Assisted pt to gym to participate in gait training with maxi sky lift as well as standing activity for NMR through RLE and reaching/shifting to the L to address pusher tendencies.  Performed squat pivot transfer w/c <> mat at total to +2 assist using Bobath method with pt again resisting movement initially but then able to lean forward with total assist manual cues.  Donned maxi sky harness and began gait training with PT on pts R side to assist with weight shift R for increased WB through RLE during stance, and assist at R knee to prevent buckling.  Also assisted with weight shift L to help advance RLE.  Feel that if pt would fully shift over to the L that he would be able to clear R foot mostly on his own, as he does have some hip flex.  OT on pts L in order to provide assist at pts hips and ribs to facilitate upright posture and to increased weight shift to the L during gait.  Transitioned to performing ring toss activity reaching to the L for rings in seated position progressing to standing to decreased pusher tendencies.  In sitting pt able to perform at min to mod assist for  mostly facilitation for weight shift L, however in standing requires +2 and MAX encouragement to continue with activity, as he tended to attempt to just sit while still in maxi sky.  Continue to provide total manual cues as mentioned above.  Pt returned to w/c as mentioned above with quick release belt donned and half lap tray.  Returned to nursing station.    Therapy Documentation Precautions:  Precautions Precautions: Fall Precaution Comments: R sided inattention,Dense Right hemiparesis,  pusher toward Right, global aphasia deficits Restrictions Weight Bearing Restrictions: No   Vital Signs: Therapy Vitals Pulse Rate: 83 BP: 137/76 mmHg Pain: Pain Assessment Pain Assessment: No/denies pain   Locomotion : Ambulation Ambulation/Gait Assistance: 1: +2 Total assist   See FIM for current functional status  Therapy/Group: Individual Therapy  Vista Deckarcell, Amora Sheehy Ann 05/13/2013, 12:43 PM

## 2013-05-13 NOTE — Progress Notes (Addendum)
Subjective/Complaints: Aphasic , OT noted apraxia during ADLs but also with intermittent refusal of tasks Fluid intake up to 1058ml on 1/8 Takes 0-50% meals OT notes anxiety and intermittent tremor Discussed need for full therapy participation Review of Systems - not able to obtain secondary to aphasia Objective: Vital Signs: Blood pressure 137/76, pulse 83, temperature 98.1 F (36.7 C), temperature source Oral, resp. rate 18, height $RemoveBe'6\' 5"'hPPHWiRrF$  (1.956 m), weight 85.004 kg (187 lb 6.4 oz), SpO2 96.00%. Dg Knee 1-2 Views Right  05/11/2013   CLINICAL DATA:  Right knee pain, altered mental status.  EXAM: RIGHT KNEE - 1-2 VIEW  COMPARISON:  Portable views of the right knee dated May 07, 2012.  FINDINGS: There remains soft tissue swelling in the suprapatellar region which may reflect an underlying joint effusion. There is no evidence of an acute fracture nor dislocation of the right knee. Very mild degenerative change is present manifested by beaking of the tibial spines and tiny spurs from the lateral femoral condyle. The patella appears normally positioned. There is no evidence of soft tissue gas.  IMPRESSION: 1. A joint effusion is likely present. 2. There are mild degenerative changes of the right knee. 3. There is no evidence of an acute fracture nor dislocation.   Electronically Signed   By: David  [place]   On: 05/11/2013 17:03   Results for orders placed during the hospital encounter of 05/06/13 (from the past 72 hour(s))  URIC ACID     Status: Abnormal   Collection Time    05/11/13 12:20 PM      Result Value Range   Uric Acid, Serum 11.3 (*) 4.0 - 7.8 mg/dL  CREATININE, SERUM     Status: Abnormal   Collection Time    05/13/13  6:45 AM      Result Value Range   Creatinine, Ser 1.17  0.50 - 1.35 mg/dL   GFR calc non Af Amer 65 (*) >90 mL/min   GFR calc Af Amer 75 (*) >90 mL/min   Comment: (NOTE)     The eGFR has been calculated using the CKD EPI equation.     This calculation has not been  validated in all clinical situations.     eGFR's persistently <90 mL/min signify possible Chronic Kidney     Disease.      Constitutional: He appears well-developed and well-nourished.  HENT:  Head: Normocephalic and atraumatic.  Eyes: Conjunctivae are normal. Pupils are equal, round, and reactive to light.  Neck: Normal range of motion. Neck supple.  Cardiovascular: Regular rhythm. Tachycardia present.  Respiratory: Effort normal and breath sounds normal. No respiratory distress. He has no wheezes.   GI: Soft. Bowel sounds are normal. He exhibits no distension. There is no tenderness.  Neurological: He is alert.   Flat affect with delayed processing. Restless and distracted. Minimal verbal output. Dense right hemiparesis with sensory deficits. Moves LUE/LLE with visual/verbal cues. Unable to follow simple commands. RUE and RLE are 0/5. No gross pain sense in the right fingers or toes. Able to only state name, moderate dysarthria Skin: Skin is warm and dry  MSK R knee effusion but not painful to palp   Assessment/Plan: 1. 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 Physiatrist is providing close team supervision and 24 hour management of active medical problems listed below. Physiatrist and rehab team continue to assess barriers to discharge/monitor patient  progress toward functional and medical goals.  FIM: FIM - Bathing Bathing Steps Patient Completed: Chest;Abdomen;Right Arm;Left upper leg;Right upper leg Bathing: 1: Two helpers  FIM - Upper Body Dressing/Undressing Upper body dressing/undressing steps patient completed: Put head through opening of pull over shirt/dress Upper body dressing/undressing: 0: Wears gown/pajamas-no public clothing FIM - Lower Body Dressing/Undressing Lower body dressing/undressing steps patient  completed: Thread/unthread left pants leg;Don/Doff left sock Lower body dressing/undressing: 1: Two helpers  FIM - Toileting Toileting: 1: Two helpers  FIM - Air cabin crew Transfers: 1-Two helpers  FIM - Control and instrumentation engineer Devices: Arm rests Bed/Chair Transfer: 1: Supine > Sit: Total A (helper does all/Pt. < 25%)  FIM - Locomotion: Wheelchair Locomotion: Wheelchair: 1: Total Assistance/staff pushes wheelchair (Pt<25%) FIM - Locomotion: Ambulation Locomotion: Ambulation Assistive Devices:  ("three muskateer style") Ambulation/Gait Assistance: 1: +2 Total assist Locomotion: Ambulation: 1: Two helpers  Comprehension Comprehension Mode: Auditory Comprehension: 2-Understands basic 25 - 49% of the time/requires cueing 51 - 75% of the time  Expression Expression Mode: Nonverbal Expression: 2-Expresses basic 25 - 49% of the time/requires cueing 50 - 75% of the time. Uses single words/gestures.  Social Interaction Social Interaction: 2-Interacts appropriately 25 - 49% of time - Needs frequent redirection.  Problem Solving Problem Solving: 2-Solves basic 25 - 49% of the time - needs direction more than half the time to initiate, plan or complete simple activities  Memory Memory: 1-Recognizes or recalls less than 25% of the time/requires cueing greater than 75% of the time  Medical Problem List and Plan:  1. 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  Doppler no carotid stenosis 2. DVT Prophylaxis/Anticoagulation: Subcutaneous Lovenox initiated 05/05/2013. Monitor platelet counts any signs of bleeding  3. Pain Management: Tylenol as needed  4. Neuropsych: This patient is not capable of making decisions on his own behalf.  5. Dysphagia. Dysphagia 1 honey thick liquids. Monitor closely for any aspiration. Followup speech therapy  6. Hypertension.Uncontrolled with tachycardia Norvasc 10 mg daily,  hydrochlorothiazide 25 mg daily. Monitor with increased mobility, titrate BB 7. OSA. CPAP. Will follow pulmonary services as needed  8.  R knee effusion mild OA, elevated urate, possible hx gout noted by sister, pt unable to provide hx, will treat with Cox 2  9.  Pt with poor cooperation with therapy, this is likely multifactorial includes neuro def of aphasia, apraxia, but may also have mood disorder and poss behavioral/personality issues.  Family input may be helpful but OT notes he doesn't cooperate much more with family in attendance, trial remeron and low dose xanax will schedule and monitor sedation LOS (Days) 7 A FACE TO FACE EVALUATION WAS PERFORMED  Joclyn Alsobrook E 05/13/2013, 9:51 AM

## 2013-05-13 NOTE — Progress Notes (Signed)
NUTRITION FOLLOW UP  Intervention:   1. General healthful diet; encourage intake of foods and beverages as able. RD to follow and assess for nutritional adequacy.  2. Supplements; Ensure Pudding po TID, each supplement provides 170 kcal and 4 grams of protein.  3. Enteral nutrition; if pt continues to refuse nutrition PO, consider initiation of TFs. Please consult RD if warranted.   NUTRITION DIAGNOSIS:  Inadequate oral intake related to poor appetite as evidenced by refusing meals, PO 0-5%.   Monitor:  1. Food/Beverage; pt meeting >/=90% estimated needs with tolerance.  2. Wt/wt change; monitor trends  Assessment:   Pt admitted with functional deficits r/t to left MCA infarct, R hemiplegia, aphasia, and dysphagia. Pt sleeping soundly at time of visit.  Pt with minimal improvement in his PO intake since last assessment.  Largely refusing meals due to frustration and MS. Remains aphasic.   Height: Ht Readings from Last 1 Encounters:  05/06/13 6\' 5"  (1.956 m)    Weight Status:   Wt Readings from Last 1 Encounters:  05/11/13 187 lb 6.4 oz (85.004 kg)    Re-estimated needs:  Kcal: 2000-2200  Protein: 105-125g  Fluid: >2.0 L/day  Skin: intact  Diet Order: Dysphagia 1, honey-thick   Intake/Output Summary (Last 24 hours) at 05/13/13 1527 Last data filed at 05/13/13 1300  Gross per 24 hour  Intake   1706 ml  Output      3 ml  Net   1703 ml    Last BM: 1/8   Labs:   Recent Labs Lab 05/06/13 2020 05/09/13 0620 05/13/13 0645  NA  --  148*  --   K  --  3.9  --   CL  --  107  --   CO2  --  23  --   BUN  --  40*  --   CREATININE 1.05 1.26 1.17  CALCIUM  --  9.7  --   GLUCOSE  --  115*  --     CBG (last 3)  No results found for this basename: GLUCAP,  in the last 72 hours  Scheduled Meds: . ALPRAZolam  0.25 mg Oral TID  . amLODipine  10 mg Oral Daily  . antiseptic oral rinse  15 mL Mouth Rinse q12n4p  . celecoxib  200 mg Oral BID  . chlorhexidine  15 mL  Mouth Rinse BID  . enoxaparin (LOVENOX) injection  40 mg Subcutaneous Q24H  . feeding supplement (ENSURE)  1 Container Oral TID BM  . hydrochlorothiazide  25 mg Oral Daily  . labetalol  300 mg Oral BID  . mirtazapine  15 mg Oral QHS  . pantoprazole sodium  40 mg Per Tube Daily    Continuous Infusions: . sodium chloride 75 mL/hr at 05/13/13 0700    Loyce DysKacie Jasline Buskirk, MS RD LDN Clinical Inpatient Dietitian Pager: [phone] Weekend/After hours pager: [phone]

## 2013-05-13 NOTE — Progress Notes (Signed)
Occupational Therapy Session Note  Patient Details  Name: [name] MRN: [id number] Date of Birth: 29-Apr-1951  Today's Date: 05/13/2013 Time: 1330-1350 Time Calculation (min): 20 min  Skilled Therapeutic Interventions/Progress Updates:    Co-tx with PT this session.  Began with transfer from supine to EOB with total assist and then to wheelchair placed on the left side with total assist +2 pt 40%.  Took pt down to the OT gym and worked in sitting initially on weightshifting toward the left side to help decrease pushing.  Noted that pt was able to sit initially EOM for at least 2-3 mins with close supervision before engaging in lateral reaching activities.  Pt also noted to have more midline orientation as well.  Pt needing max coaxing to reach laterally to the right side using his right hand, to grasp rings from therapist.  Pt on most occasions would reach blindly and required max instructional cueing to locate the ring.  Also integrated naming the color of the ring as well which he was able to do with approximately 20% accuracy.  Transitioned to static standing after conclusion of the reaching task.  Used 3 muskateers technique to stand with therapist stabilizing the right knee with max facilitation.  Pt continues to exhibit trunk and cervical flexion in standing as well as pushing to the right side and moving his RLE out to the side for increased pushing.  Stood for approximately 4 mins.  Returned to room at end of session and transferred back to bed with total assist +2 (pt 40%).  All transfers performed were Bobath technique over the back with emphasis on pt weightshifting forward and to the left before beginning transfer.    Therapy Documentation Precautions:  Precautions Precautions: Fall Precaution Comments: R sided inattention,Dense Right hemiparesis,  pusher toward Right, global aphasia deficits Restrictions Weight Bearing Restrictions: No  Pain: Pain Assessment Pain Assessment:  No/denies pain Pain Score: 0-No pain  See FIM for current functional status  Therapy/Group: Individual Therapy  Laray Corbit OTR/L 05/13/2013, 3:43 PM

## 2013-05-13 NOTE — Progress Notes (Signed)
Speech Language Pathology Daily Session Note  Patient Details  Name: [name] MRN: [id number] Date of Birth: 1950/06/28  Today's Date: 05/13/2013 Time: [id number] Time Calculation (min): 45 min  Short Term Goals: Week 1: SLP Short Term Goal 1 (Week 1): Pt will utilize safe swallowing strategies with Dys 1 textures and honey thick liquids with Mod cues  SLP Short Term Goal 2 (Week 1): Pt will consume trials of nectar thick liquids with minimal overt s/s of aspiration with Max cues SLP Short Term Goal 3 (Week 1): Pt will sustain attention to basic functional task for 5 minutes with Max cues SLP Short Term Goal 4 (Week 1): Pt will communicate basic wants/needs via multimodal communication with Max cues SLP Short Term Goal 5 (Week 1): Pt will follow one-step commands with Max cues SLP Short Term Goal 6 (Week 1): Pt will receptively identify common objects from a field of three with Max cues  Skilled Therapeutic Interventions: Skilled treatment session focused on addressing dysphagia and language goals. SLP facilitated session with set-up of Dys. 1 textures and honey-thick liquids with Total assist feed, due to patient frustration and refusal to self-feed.  Patient demonstrated no overt s/s of aspiration throughout session.  Patient able to express to SLP when he was done by putting hand up and moving p.o. away.  SLP also facilitated session with automatic verbal sequences; patient counted 1-10 with 80% accuracy and stated days of the week with 100% accuracy and Max multi-modal cues. Patient also participated in structured sentence completion task with picture prompts with Max assist and 60% accuracy.  Continue plan of care.   FIM:  Comprehension Comprehension Mode: Auditory Comprehension: 2-Understands basic 25 - 49% of the time/requires cueing 51 - 75% of the time Expression Expression Mode: Nonverbal;Verbal Expression: 2-Expresses basic 25 - 49% of the time/requires cueing 50 - 75% of the  time. Uses single words/gestures. Social Interaction Social Interaction: 2-Interacts appropriately 25 - 49% of time - Needs frequent redirection. Problem Solving Problem Solving: 2-Solves basic 25 - 49% of the time - needs direction more than half the time to initiate, plan or complete simple activities Memory Memory: 1-Recognizes or recalls less than 25% of the time/requires cueing greater than 75% of the time  Pain Pain Assessment Pain Assessment: No/denies pain  Therapy/Group: Individual Therapy  Charlane FerrettiMelissa Briscoe Daniello, M.A., CCC-SLP [phone]  Carly Sabo 05/13/2013, 11:50 AM

## 2013-05-13 NOTE — Progress Notes (Signed)
Patient refuses CPAP and will call if he changes his mind. RT will continue to monitor.

## 2013-05-13 NOTE — Progress Notes (Signed)
Occupational Therapy Session Note  Patient Details  Name: [name] MRN: [id number] Date of Birth: [birth year]  Today's Date: 05/13/2013 Time: [id number] Time Calculation (min): 60 min  Second session: Time: 11:45-12:00 Time Calculation (min): 15 mins  Short Term Goals: Week 1:  OT Short Term Goal 1 (Week 1): Pt will sit statically EOB with close supervision for 5 mins in preparation for selfcare tasks. OT Short Term Goal 2 (Week 1): Pt will perform UB bathing with min assist and mod demonstrational cueing supported in wheelchair. OT Short Term Goal 3 (Week 1): Pt will perform LB bathing sit to stand with total assist pt 60 %. OT Short Term Goal 4 (Week 1): Pt will perform toilet transfer with max assist to the right side. OT Short Term Goal 5 (Week 1): Pt/family will return demonstrate safe performance of PROM/AAROM exercises for the RUE.  Skilled Therapeutic Interventions/Progress Updates:    First session: Pt worked on bathing and dressing during session at the sink.  Pt needing max demonstrational cueing to sequence through during bathing and dressing but did note some automatic responses, more with dressing such as lifting his left leg to place in the pants leg.  Pt still resistant initially to attempting to wash and takes the wash cloth and places it back on the sink.  Pt needing total assist to integrate the RUE into functional tasks as well.  Demonstrates severe pushing to the right in sitting and avoid weightshifting to the left in standing.  Needed multiple attempts for sit to stand at the sink for therapist to help wash his peri area and pull pants over hips.  Pt given nectar thick liquids during session but does not follow swallowing precautions.  Had one episode of coughing that persisted for 1-2 mins.  Therapist attempted to suction out residual liquids left in his mouth as well.    Second session: Co-tx with PT during session.   Utilized walking sling in the maxisky down in  the gym for mobility and standing.  Pt at times hanging out in the sling and not attempting to stand completely.  Demonstrates increased trunk flexion in standing as well and needs max facilitation to maintain right knee extension.  He also needs max facilitation to weightshift to the left side to attempt advancing the RLE.  Pt needing total assist to advance the LLE with short distance mobility.  Worked in sitting and standing as well to weightshift to the left for reaching rings with the LUE.  Pt initially resists participation in activity and frustration can be seen but with persistence he will perform.    Therapy Documentation Precautions:  Precautions Precautions: Fall Precaution Comments: R sided inattention,Dense Right hemiparesis,  pusher toward Right, global aphasia deficits Restrictions Weight Bearing Restrictions: No  Pain: Pain Assessment Pain Assessment: No/denies pain ADL: See FIM for current functional status  Therapy/Group: Individual Therapy  [name] OTR/L 05/13/2013, 12:36 PM

## 2013-05-13 NOTE — Progress Notes (Addendum)
Physical Therapy Session Note  Patient Details  Name: [name] A Booher MRN: [id number] Date of Birth: 04-10-1951  Today's Date: 05/13/2013 Time: 1350-1420 (Co-tx with JM (entire session from 1330-1420)) Time Calculation (min): 30 min  Short Term Goals: Week 1:  PT Short Term Goal 1 (Week 1): Patient will be able to perform bed mobility with Mod-assist. PT Short Term Goal 2 (Week 1): Patient will be able to perform transfers with Max-assist. PT Short Term Goal 3 (Week 1): Patient will be able to perform dynamic sitting balance with Mod-assist. PT Short Term Goal 4 (Week 1): Patient will be able to propel w/c 4450' with Mod-assist.  Skilled Therapeutic Interventions/Progress Updates:    Co-tx with primary OT. Session focused on dynamic sitting balance, static/dynamic standing balance, and promoting weight shift to R side to facilitate midline posture. Pt received semi-reclined in bed, asleep. Performed supine>sit with +2Total A, manual facilitation of R knee flexion, initiation of RUE movement across midline. Squat pivot (Bobath transfer) from bed<>w/c (to R side initially, then to L side for second transfer) with manual stabilization of R knee. Max verbal cues for pt to lift RUE using LUE for w/c parts management. Transported pt gym in w/c, where pt performed squat pivot from w/c<>mat table (to R side for initial transfer; to L side for second transfer) with assist as described above. See below for detailed description of NMR. Transported pt to room, where session ended. In supine, +2A for scooting to [facility] with pt assisting using LUE and bilat LE's, manual facilitation of RLE weightbearing. Therapists departed with pt semi-reclined in bed with 3 rails up, bed alarm on, and all needs within reach.  Therapy Documentation Precautions:  Precautions Precautions: Fall Precaution Comments: R sided inattention,Dense Right hemiparesis,  pusher toward Right, global aphasia deficits Restrictions Weight Bearing  Restrictions: No Pain: Pain Assessment Pain Assessment: No/denies pain Pain Score: 0-No pain Locomotion : Ambulation Ambulation/Gait Assistance: 1: +2 Total assist  Balance:   - Seated EOM, pt exhibits static sitting balance with accurate midline orientation with intermittent LUE support x1-2 consecutive minutes prior to LOB; SBA to mod A (to recover from LOB x2). - Dynamic sitting balance with min-max A: reaching to L side with manual facilitation of L-sided weightbearing, L lateral flexion of trunk; max verbal cues for visual fixation on target (ring), followed by LUE ring toss x10-15 reps.  - Dynamic standing balance x3 min with +2Total A, manual stabilization of R knee, manual stabilization of LLE secondary to pt tendency to position LLE anterior to RLE. M/L weight shifting to increase pt awareness of trunk position.  See FIM for current functional status  Therapy/Group: Co-Treatment  Hobble, Blair A 05/13/2013, 3:40 PM

## 2013-05-14 ENCOUNTER — Inpatient Hospital Stay (HOSPITAL_COMMUNITY): Payer: Medicare Other | Admitting: Physical Therapy

## 2013-05-14 NOTE — Progress Notes (Signed)
Patient ID: [name], male   DOB: 05-01-1951, 63 y.o.   MRN: [id number]   Subjective/Complaints:  05/14/13. 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  Review of Systems - not able to obtain secondary to aphasia  Patient Vitals for the past 24 hrs:  BP Temp Temp src Pulse Resp SpO2 Weight  05/14/13 0620 154/78 mmHg 98.8 F (37.1 C) Oral 70 17 98 % -  05/13/13 2001 146/71 mmHg - - 76 - - -  05/13/13 1901 - - - - - - 84.278 kg (185 lb 12.8 oz)  05/13/13 1500 142/71 mmHg 97.3 F (36.3 C) Oral 75 22 98 % -     Intake/Output Summary (Last 24 hours) at 05/14/13 0941 Last data filed at 05/14/13 0844  Gross per 24 hour  Intake    120 ml  Output      0 ml  Net    120 ml   Objective:  Constitutional: He appears well-developed and well-nourished.  HENT:  N/C oxygen in place Head: Normocephalic and atraumatic.  Eyes: Conjunctivae are normal. Pupils are equal, round, and reactive to light.  Neck: Normal range of motion. Neck supple.  Cardiovascular: Regular rhythm.  Respiratory: Effort normal and breath sounds normal. No respiratory distress. He has no wheezes.   GI: Soft. Bowel sounds are normal. He exhibits no distension. There is no tenderness.  Neurological: He is alert.   Flat affect with delayed processing. Restless and distracted. Minimal verbal output. Dense right hemiparesis with sensory deficits. Skin: Skin is warm and dry     Assessment/Plan: 1. 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 2. HTN- stable 3. DVT Prophylaxis/Anticoagulation: Subcutaneous Lovenox initiated 05/05/2013. Monitor platelet counts any signs of bleeding   LOS (Days) 8 A FACE TO FACE EVALUATION WAS PERFORMED  Rogelia BogaKWIATKOWSKI,Cathyann Kilfoyle FRANK 05/14/2013, 9:37 AM

## 2013-05-14 NOTE — Progress Notes (Signed)
Pt. Refuses CPAP at this time. Pt. Was made aware to call RT if he changes his mind & decides to wear CPAP anytime during the night.

## 2013-05-15 ENCOUNTER — Inpatient Hospital Stay (HOSPITAL_COMMUNITY): Payer: Medicare Other | Admitting: *Deleted

## 2013-05-15 ENCOUNTER — Inpatient Hospital Stay (HOSPITAL_COMMUNITY): Payer: Medicare Other

## 2013-05-15 NOTE — Progress Notes (Signed)
Patient ID: [name], male   DOB: Mar 08, 1951, 63 y.o.   MRN: [id number]  Patient ID: [name], male   DOB: Mar 08, 1951, 63 y.o.   MRN: [id number]   Subjective/Complaints:  05/15/13. 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  Review of Systems - not able to obtain secondary to aphasia  Patient Vitals for the past 24 hrs:  BP Temp Temp src Pulse Resp SpO2  05/15/13 0548 132/80 mmHg 97.4 F (36.3 C) Axillary 71 19 96 %  05/14/13 1947 134/74 mmHg - - 73 - -  05/14/13 1019 155/87 mmHg - - - - -     Intake/Output Summary (Last 24 hours) at 05/15/13 0903 Last data filed at 05/15/13 [id number]  Gross per 24 hour  Intake   1065 ml  Output    476 ml  Net    589 ml   Objective:  Constitutional: He appears well-developed and well-nourished.  HENT:  N/C oxygen in place Head: Normocephalic and atraumatic.  Eyes: Conjunctivae are normal. Pupils are equal, round, and reactive to light.  Neck: Normal range of motion. Neck supple.  Cardiovascular: Regular rhythm.  Respiratory: Effort normal and breath sounds normal. No respiratory distress. He has no wheezes.   GI: Soft. Bowel sounds are normal. He exhibits no distension. There is no tenderness.  GU- condom cath in place Neurological: He is alert.   Dense right hemiparesis with sensory deficits. Skin: Skin is warm and dry     Assessment/Plan: 1. 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 2. HTN- stable 3. DVT Prophylaxis/Anticoagulation: Subcutaneous Lovenox initiated 05/05/2013. Monitor platelet counts any signs of bleeding   LOS (Days) 9 A FACE TO FACE EVALUATION WAS PERFORMED  Rogelia BogaKWIATKOWSKI,Loana Salvaggio FRANK 05/15/2013, 9:03 AM

## 2013-05-15 NOTE — Progress Notes (Signed)
Dr. Amador CunasKwiatkowski notified of patient sleepin soundly; given Xanax 0.25 around 2000; and due Remeron 15 mg now.  Order received:  Hold Remeron dose tonight only.

## 2013-05-15 NOTE — Progress Notes (Signed)
Pt. Would not state yes or no when asked if he was going to wear his cpap. Pt. Would only wake up and go back to sleep. RN stated that pt. Does not wear his cpap and that he refuses. RT told pt. To notify if he changes his mind.

## 2013-05-15 NOTE — Progress Notes (Signed)
Dr. Posey ReaPlotnikov notified of patient receiving Xanax 0.25 mg at 1952, due Remeron 15 mg now; patient sleeping soundly; per report, patient difficult to wake up in morning if both meds given and first therapy early in morning.  Order received:  Hold Remeron dose tonight only.

## 2013-05-15 NOTE — Progress Notes (Signed)
Occupational Therapy Note Patient Details  Name: [name] MRN: [id number] Date of Birth: March 10, 1951 Today's Date: 05/15/2013  Time:1130-1200  (30 MIN)  1st session Pain:  NONE NOTED Individual session  1st session   Pt.lying in bed upon OT arrival with eyes closed.  Aroused pt.  Placed HOB at 60 degrees and performed PROM to RUE.  Had pt place LUE to assist with movement.  Provided washcloth for pt to wash face. Maintained sustained attention for 5 seconds during task.  Provided tooth brush for pt to brush teeth.  Ideational apraxia noted as pt using toothbrush to comb beard.  Provided HOH assist to initiate task but stopped as soon as OT removed assistance.  Pt. Followed one step directions when asked to stick out tongue.to allow OT to brush tongue which is heavily  coated in white.  Left pt in bed with call bell,phone within reach.    Time:   1400- 1440  (40 min) 2nd session Pain:  none Individual session:  2nd session:  Pt. Lying in bed.  Pt refused to get up into wc.  No +2 available during session.  Performed NMRE to RUE.  Did tracking exercises to right during conversation.  Pt. Initiating more words today.  Asked him questions that required one word answers.  He could say no consistently but could not say yes.  He spoke 3 word sentences but did not make sense.  Provided some oral care.  Left pt in bed with bed alarm on and call bell,phone within reach.     Humberto Seals.    Luella Gardenhire J 05/15/2013, 12:23 PM

## 2013-05-16 ENCOUNTER — Inpatient Hospital Stay (HOSPITAL_COMMUNITY): Payer: Medicare Other | Admitting: Speech Pathology

## 2013-05-16 ENCOUNTER — Ambulatory Visit (HOSPITAL_COMMUNITY): Payer: Medicare Other | Admitting: Occupational Therapy

## 2013-05-16 ENCOUNTER — Ambulatory Visit (HOSPITAL_COMMUNITY): Payer: Medicare Other | Admitting: Rehabilitation

## 2013-05-16 ENCOUNTER — Encounter (HOSPITAL_COMMUNITY): Payer: Medicare Other | Admitting: Occupational Therapy

## 2013-05-16 NOTE — Progress Notes (Signed)
Speech Language Pathology Daily Session Note  Patient Details  Name: [name] MRN: [id number] Date of Birth: May 03, 1951  Today's Date: 05/16/2013 Time: 4098-11911150-1205 (Co-tx with JM; entire session from 1120-1205) Time Calculation (min): 15 min  Short Term Goals: Week 1: SLP Short Term Goal 1 (Week 1): Pt will utilize safe swallowing strategies with Dys 1 textures and honey thick liquids with Mod cues  SLP Short Term Goal 2 (Week 1): Pt will consume trials of nectar thick liquids with minimal overt s/s of aspiration with Max cues SLP Short Term Goal 3 (Week 1): Pt will sustain attention to basic functional task for 5 minutes with Max cues SLP Short Term Goal 4 (Week 1): Pt will communicate basic wants/needs via multimodal communication with Max cues SLP Short Term Goal 5 (Week 1): Pt will follow one-step commands with Max cues SLP Short Term Goal 6 (Week 1): Pt will receptively identify common objects from a field of three with Max cues  Skilled Therapeutic Interventions: Skilled treatment session focused on addressing dysphagia goals and family education. SLP facilitated session with set-up of Dys. 1 textures and honey-thick liquids with Total assist feed, due to patient frustration and refusal to self-feed.  Patient willing to manage cup and required Mod cues for portion control, which prevented overt s/s of aspiration throughout session.  Sister present for session which positively impacted patient's participation; she asked appropriate questions regarding aphasia and dysphagia and positively motivated patient.  Patient also participated in confrontation naming task during meal with Mod assist semantic and phonemic cues.  Patient required Max cues for eye contact with communication partners throughout session.  Recommend repeat objective assessment tomorrow while sister is on town to assist with motivation and willingness to participate.    FIM:  Comprehension Comprehension Mode:  Auditory Comprehension: 2-Understands basic 25 - 49% of the time/requires cueing 51 - 75% of the time Expression Expression Mode: Verbal Expression: 2-Expresses basic 25 - 49% of the time/requires cueing 50 - 75% of the time. Uses single words/gestures. Social Interaction Social Interaction: 2-Interacts appropriately 25 - 49% of time - Needs frequent redirection. Problem Solving Problem Solving: 1-Solves basic less than 25% of the time - needs direction nearly all the time or does not effectively solve problems and may need a restraint for safety Memory Memory: 2-Recognizes or recalls 25 - 49% of the time/requires cueing 51 - 75% of the time FIM - Eating Eating Activity: 1: Helper feeds patient;4: Help with managing cup/glass;5: Needs verbal cues/supervision;4: Helper checks for pocketed food  Pain Pain Assessment Pain Assessment: No/denies pain Pain Score: 0-No pain  Therapy/Group: Individual Therapy  Charlane FerrettiMelissa Venetta Knee, M.A., CCC-SLP [phone]  [name] 05/16/2013, 12:21 PM

## 2013-05-16 NOTE — Progress Notes (Signed)
Physical Therapy Weekly Progress Note  Patient Details  Name: [name] MRN: [id number] Date of Birth: 1951-01-25  Today's Date: 05/16/2013  Patient has met 0 of 4 short term goals.  Pt continues to demonstrate decreased participation and increased resistance to assistance during therapy sessions, which is probably greatest limiting factor.  Also continues to demonstrate increased pusher postures to the R during sitting, transfers and standing.  Requires +2 assist for transfers, standing and ambulation.  He is able to perform static and dynamic sitting balance and mod to max assist.  Sister has been present during sessions today and note pts participation has increased.  Feel he will continue to make improvements if he continues to initiate/participate in sessions.    Patient continues to demonstrate the following deficits: decreased static and dynamic sitting balance, decreased functional use of RUE/LE, decreased attention, decreased awareness, decreased perception/orientation to midline, R inattention and therefore will continue to benefit from skilled PT intervention to enhance overall performance with activity tolerance, balance, postural control, ability to compensate for deficits, functional use of  right upper extremity and right lower extremity, attention, awareness, coordination and knowledge of precautions.  Patient progressing toward long term goals..  Continue plan of care.  PT Short Term Goals Week 1:  PT Short Term Goal 1 (Week 1): Patient will be able to perform bed mobility with Mod-assist. PT Short Term Goal 1 - Progress (Week 1): Progressing toward goal PT Short Term Goal 2 (Week 1): Patient will be able to perform transfers with Max-assist. PT Short Term Goal 2 - Progress (Week 1): Progressing toward goal PT Short Term Goal 3 (Week 1): Patient will be able to perform dynamic sitting balance with Mod-assist. PT Short Term Goal 3 - Progress (Week 1): Progressing toward  goal PT Short Term Goal 4 (Week 1): Patient will be able to propel w/c 40' with Mod-assist. PT Short Term Goal 4 - Progress (Week 1): Progressing toward goal Week 2:  PT Short Term Goal 1 (Week 2): Patient will be able to perform bed mobility with mod assist. PT Short Term Goal 2 (Week 2): Patient will be able to perform transfers with Max-assist of one person PT Short Term Goal 3 (Week 2): Patient will be able to perform static sitting balance x 3-5 mins with min assist. PT Short Term Goal 4 (Week 2): Pt will perform dynamic sitting balance at max assist level consistently.  PT Short Term Goal 5 (Week 2): Patient will be able to propel w/c 88' with Mod-assist.  Skilled Therapeutic Interventions/Progress Updates:     See previous notes  Therapy Documentation Precautions:  Precautions Precautions: Fall Precaution Comments: R sided inattention,Dense Right hemiparesis,  pusher toward Right, global aphasia deficits Restrictions Weight Bearing Restrictions: No   Pain: Pain Assessment Pain Assessment: Faces Pain Score: 0-No pain Locomotion : Ambulation Ambulation/Gait Assistance: 1: +2 Total assist   See FIM for current functional status  Therapy/Group: Individual Therapy  Denice Bors 05/16/2013, 3:44 PM

## 2013-05-16 NOTE — Progress Notes (Signed)
Subjective/Complaints: Aphasic , OT noted apraxia during ADLs but also with intermittent refusal of tasks Fluid intake up to 1080ml on 1/8 Takes 0-50% meals OT notes anxiety and intermittent tremor Discussed need for full therapy participation Review of Systems - not able to obtain secondary to aphasia Objective: Vital Signs: Blood pressure 157/85, pulse 76, temperature 98.6 F (37 C), temperature source Oral, resp. rate 19, height 6\' 5"  (1.956 m), weight 84.278 kg (185 lb 12.8 oz), SpO2 96.00%. No results found. No results found for this or any previous visit (from the past 72 hour(s)).    Constitutional: He appears well-developed and well-nourished.  HENT:  Head: Normocephalic and atraumatic.  Eyes: Conjunctivae are normal. Pupils are equal, round, and reactive to light.  Neck: Normal range of motion. Neck supple.  Cardiovascular: Regular rhythm. Tachycardia present.  Respiratory: Effort normal and breath sounds normal. No respiratory distress. He has no wheezes.   GI: Soft. Bowel sounds are normal. He exhibits no distension. There is no tenderness.  Neurological: He is alert.   Flat affect with delayed processing. Restless and distracted. Minimal verbal output. Dense right hemiparesis with sensory deficits. Moves LUE/LLE with visual/verbal cues. Unable to follow simple commands. RUE and RLE are 0/5. No gross pain sense in the right fingers or toes. Able to only state name, moderate dysarthria Skin: Skin is warm and dry  MSK R knee effusion but not painful to palp   Assessment/Plan: 1. 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 Physiatrist is providing close team supervision and 24 hour management of active medical problems listed below. Physiatrist and rehab team continue to assess barriers to discharge/monitor patient progress  toward functional and medical goals.  FIM: FIM - Bathing Bathing Steps Patient Completed: Chest;Abdomen;Right upper leg;Left upper leg Bathing: 1: Two helpers  FIM - Upper Body Dressing/Undressing Upper body dressing/undressing steps patient completed: Put head through opening of pull over shirt/dress Upper body dressing/undressing: 0: Wears gown/pajamas-no public clothing FIM - Lower Body Dressing/Undressing Lower body dressing/undressing steps patient completed: Thread/unthread left pants leg Lower body dressing/undressing: 1: Two helpers  FIM - Toileting Toileting: 0: Activity did not occur  FIM - ArchivistToilet Transfers Toilet Transfers: 1-Two helpers  FIM - BankerBed/Chair Transfer Bed/Chair Transfer Assistive Devices: Arm rests Bed/Chair Transfer: 1: Two helpers  FIM - Locomotion: PrintmakerWheelchair Locomotion: Wheelchair: 1: Total Assistance/staff pushes wheelchair (Pt<25%) FIM - Locomotion: Ambulation Locomotion: Ambulation Assistive Devices: MaxiSky Ambulation/Gait Assistance: 1: +2 Total assist Locomotion: Ambulation: 0: Activity did not occur  Comprehension Comprehension Mode: Auditory Comprehension: 2-Understands basic 25 - 49% of the time/requires cueing 51 - 75% of the time  Expression Expression Mode: Verbal (global aphasia; unable to understand speech) Expression: 2-Expresses basic 25 - 49% of the time/requires cueing 50 - 75% of the time. Uses single words/gestures.  Social Interaction Social Interaction: 2-Interacts appropriately 25 - 49% of time - Needs frequent redirection.  Problem Solving Problem Solving: 1-Solves basic less than 25% of the time - needs direction nearly all the time or does not effectively solve problems and may need a restraint for safety  Memory Memory: 1-Recognizes or recalls less than 25% of the time/requires cueing greater than 75% of the time  Medical Problem List and Plan:  1. 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  Doppler no carotid stenosis 2. DVT Prophylaxis/Anticoagulation: Subcutaneous Lovenox initiated 05/05/2013. Monitor platelet counts  any signs of bleeding  3. Pain Management: Tylenol as needed  4. Neuropsych: This patient is not capable of making decisions on his own behalf.  5. Dysphagia. Dysphagia 1 honey thick liquids. Monitor closely for any aspiration. Followup speech therapy  6. Hypertension.Uncontrolled with tachycardia Norvasc 10 mg daily, hydrochlorothiazide 25 mg daily. Monitor with increased mobility, titrate BB 7. OSA. CPAP. Will follow pulmonary services as needed  8.  R knee effusion mild OA, elevated urate, possible hx gout noted by sister, pt unable to provide hx, will treat with Cox 2 , no pain to palpation or with ROM 9.  Pt with intermittent cooperation with therapy, this is likely multifactorial includes neuro def of aphasia, apraxia, but may also have mood disorder and poss behavioral/personality issues.  Family input may be helpful but OT notes he doesn't cooperate much more with family in attendance, trial remeron and low dose xanax will schedule and monitor sedation, seems to do better with male therapists LOS (Days) 10 A FACE TO FACE EVALUATION WAS PERFORMED  Detria Cummings E 05/16/2013, 8:20 AM

## 2013-05-16 NOTE — Progress Notes (Signed)
Physical Therapy Session Note  Patient Details  Name: [name] MRN: [id number] Date of Birth: 06-21-1950  Today's Date: 05/16/2013 Time: 4098-11911150-1205 (Co-tx with JM; entire session from 1120-1205) Time Calculation (min): 15 min  Short Term Goals: Week 1:  PT Short Term Goal 1 (Week 1): Patient will be able to perform bed mobility with Mod-assist. PT Short Term Goal 2 (Week 1): Patient will be able to perform transfers with Max-assist. PT Short Term Goal 3 (Week 1): Patient will be able to perform dynamic sitting balance with Mod-assist. PT Short Term Goal 4 (Week 1): Patient will be able to propel w/c 2850' with Mod-assist.  Skilled Therapeutic Interventions/Progress Updates:    Co-tx with primary OT; see OT note for further detail. Pt received seated in w/c accompanied by sister. Session focused on dynamic sitting balance, addressing pt tendency to push to R side, and improving midline orientation. Transported pt to gym in w/c. Performed low-pivot transfer from w/c>mat table (to R side) with +1Total A (Bobath method). See below for detailed description of NMR. To return to w/c, performed gait x5' with +2Total A (3 musketeers assist) with therapist at R manually stabilizing R knee and manually facilitating RLE advancement during R swing phase, therapist at L manually stabilizing LLE, both therapists manually facilitating bilat weight shift; multimodal cueing for technique Transported pt in w/c to room, where session ended. Therapists departed with pt seated in w/c with half lap tray on, quick release belt on for safety, sister present, and all needs within reach.  Therapy Documentation Precautions:  Precautions Precautions: Fall Precaution Comments: R sided inattention,Dense Right hemiparesis,  pusher toward Right, global aphasia deficits Restrictions Weight Bearing Restrictions: No Vital Signs: Therapy Vitals Pulse Rate: 77 BP: 142/85 mmHg Pain: Pain Assessment Pain Assessment:  No/denies pain Pain Score: 0-No pain Locomotion : Ambulation Ambulation/Gait Assistance: 1: +2 Total assist  Balance: Dynamic sitting balance with min guard to mod A (to recover from anterior LOB) : LLE reaching across midline, followed by L lateral trunk flexion with manual facilitation of L shoulder abduction to place rings on posts. Pt requires multimodal cueing to facilitate visual fixation on target and to promote L-sided weight shift. Performed 5 trials with rest breaks (during which pt performed L forearm propping on mat table) between each trial. Longest trial: 8 consecutive minutes without rest break.  See FIM for current functional status  Therapy/Group: Co-Treatment  [name] A 05/16/2013, 12:19 PM

## 2013-05-16 NOTE — Progress Notes (Signed)
Occupational Therapy Weekly Progress Note  Patient Details  Name: [name] MRN: [id number] Date of Birth: 1950-06-27  Today's Date: 05/16/2013 Time: 0800-0900 Time Calculation (min): 60 min  Second Session: Time11:20-11:50 Time Calculation (min): 30 mins  Patient has met 0 of 5 short term goals.  [name] continues to demonstrate moderate pusher strategies to the right side in both sitting and standing secondary to decreased spatial awareness.  He tends to try and stabilize himself from falling using the LUE and grasp objects within reach.   He continues to demonstrate moderate ideomotor planning deficits with regards to correcting his balance as well as with simple selfcare tasks.  He continues to demonstrate some decreased participation but this has improved over the past few days compared to last week.  Overall still needs max assist for static sitting balance and dynamic sitting balance.  He is able to maintain static sitting for brief periods of 1 minute before losing his balance to the right side.  Currently Brunnstrum stage I movement in the right arm and hand noted.  He needs max demonstrational cueing to position it prior to transfers and selfcare.  All functional transfers are still total assist using Bobath method and sit to stand and standing are total assist +2 (pt 40%).  Feel he needs continued OT at this time as he is not at a manageable level for selfcare tasks and functional transfers.  Family is not sure they can provide 24 hour assistance/supervision so pt may need SNF for further rehab.    Patient continues to demonstrate the following deficits: decreased balance, decreased RUE functional use, decreased awareness, increased ideational aprxia and therefore will continue to benefit from skilled OT intervention to enhance overall performance with BADL.  Patient progressing toward long term goals..  Continue plan of care.  OT Short Term Goals Week 2:  OT Short Term Goal 1  (Week 2): Pt will sit statically EOB with close supervision for 5 mins in preparation for selfcare tasks. OT Short Term Goal 2 (Week 2): Pt will perform UB bathing with min assist and mod demonstrational cueing supported in wheelchair. OT Short Term Goal 3 (Week 2): Pt will perform LB bathing sit to stand with total assist pt 60 %. OT Short Term Goal 4 (Week 2): Pt will perform toilet transfer with max assist to the right side. OT Short Term Goal 5 (Week 2): Pt/family will return demonstrate safe performance of PROM/AAROM exercises for the RUE.  Skilled Therapeutic Interventions/Progress Updates:    Session 1:  Worked on bathing and dressing sitting on the edge of the bed.  He needed max assist to transfer to the EOB from supine.  Pt impulsive with movement and attempted to slide LLE off of the bed and sit up instead of rolling.  Initially needed min to mod assist for static sitting but progressed to max assist.  Pt overall max demonstrational cueing to sequence bathing.  Frequent LOB to the right as well as posteriorly.  Total +2 (pt 40%) for sit to stand for washing his peri area and to pull pants over hips.  Increased pushing to the right in standing as well with increased trunk flexion.  Mod assist to donn pullover shirt in sitting with pt being able to thread his left arm through the sleeve with min assist and pull over his head.  Transferred to the wheelchair with total assist Bobath technique to the right side.  Pt given comb at the sink and attempted to  brush his teeth with it.  Therapist helped him get setup with toothbrush and he was able to brush his teeth with min assist for thoroughness.  His sister arrived at end of session and she was educated on progress.  She plans on buying him some shoes and bringing them in tomorrow.  Session 2:  Co-tx with PT during session down in the gym.  Pt transferred to the therapy mat with total assist Bobath squat pivot transfer.  During session had pt focus on  grasping rings presented at midline and reaching to the right and placing them on the appropriate post.  Had pt tr to identify and state color of the rings as he grasped them but only correct on approximately 20%.  Increased lateral shift to the left hip noted helping to reduce pushing.  Pt needing mod demonstrational cueing from therapist to initially understand and follow the task but with repetition was eventually able to repeat the task.  Tends to sit in a posterior pelvic tilt.  Had him sit on a taller mat to help promote anterior tilt.  Pt also needing max instructional cueing from sister as he tends to sit with cervical flexion.  Transferred back to wheelchair with short distance mobility total assist +2 (pt 25%).  Used "three muskateers" technique.  Pt needing total assist to advance the RLE during process.  Therapy Documentation Precautions:  Precautions Precautions: Fall Precaution Comments: R sided inattention,Dense Right hemiparesis,  pusher toward Right, global aphasia deficits Restrictions Weight Bearing Restrictions: No  Pain: Pain Assessment Pain Assessment: Faces Pain Score: 0-No pain ADL: See FIM for current functional status  Therapy/Group: Individual Therapy  [name] OTR/L 05/16/2013, 10:12 AM

## 2013-05-16 NOTE — Progress Notes (Addendum)
Physical Therapy Session Note  Patient Details  Name: Linna Capricedward A Bohnsack MRN: [id number] Date of Birth: [birth year]  Today's Date: 05/16/2013 Time: 1302 (co tx with OT 1302-1345)-1315 Time Calculation (min): 13 min  Short Term Goals: Week 1:  PT Short Term Goal 1 (Week 1): Patient will be able to perform bed mobility with Mod-assist. PT Short Term Goal 2 (Week 1): Patient will be able to perform transfers with Max-assist. PT Short Term Goal 3 (Week 1): Patient will be able to perform dynamic sitting balance with Mod-assist. PT Short Term Goal 4 (Week 1): Patient will be able to propel w/c 8550' with Mod-assist.  Skilled Therapeutic Interventions/Progress Updates:   Pt received sitting in w/c in room with sister present during session.  Assisted pt to gym via w/c at total assist and performed squat pivot using Bobath method w/c <> mat at total assist (+2) for safety and to guide hips into chair.  Continue to provide manual assist for forward weight shift, however did note that pt better able to maintain forward trunk lean throughout transfer.  Also provided assist at R knee to prevent buckle and to provide WB through RLE during transfer.  Focus of session was standing NMR with +2 assist (40%) with OT at pts L in order to facilitate increased L weight shift during reaching activity.  Did this by providing assist at pts hips/low back and also at L/R axilla and chest for upright posture.  PT providing assist under pts R axilla and at pts R knee to prevent buckling and also around waist/buttocks for increased upright posture.  Performed 3 reps of 4-7 mins of reaching with 3 seated rest breaks in between with pt propped on L elbow.  Pt continues to demonstrate increased pusher tendencies and increased anxiety when moving past midline to the L side.  Discussed expected outcomes with daughter and amount of assist he will probably need at D/C.  Note she does very well at motivating pt, however [place]  and will be returning home tomorrow.  Pt returned to room at end of session and transferred back to bed via method and assist mentioned above.  Pt left in bed with all needs in reach and 3 bedrails with bed alarm set.  See OT note for further details.   Therapy Documentation Precautions:  Precautions Precautions: Fall Precaution Comments: R sided inattention,Dense Right hemiparesis,  pusher toward Right, global aphasia deficits Restrictions Weight Bearing Restrictions: No   Pain: Pain Assessment Pain Assessment: No/denies pain Pain Score: 0-No pain   Locomotion : Ambulation Ambulation/Gait Assistance: 1: +2 Total assist   See FIM for current functional status  Therapy/Group: Co-Treatment w/ OT  Vista DeckParcell, Lauriana Denes Ann 05/16/2013, 1:51 PM

## 2013-05-16 NOTE — Progress Notes (Signed)
Occupational Therapy Session Note  Patient Details  Name: [name] MRN: [id number] Date of Birth: August 15, 1950  Today's Date: 05/16/2013 Time: [id number] Time Calculation (min): 30 min  Skilled Therapeutic Interventions/Progress Updates:    Transferred to the wheelchair with total assist Bobath method squat pivot.  Once on the mat worked on standing balance and endurance while incorporating reaching task with the LUE.  Pt able to stand 3 intervals of 4-7 mins with rest breaks in-between.  Pt needed facilitation in standing to maintain hip extension, right knee extension, and cervical extension to neutral.  Incorporated clothespin activity to have pt reaching with his LUE to grasp the clothespin and then place on vertical pole positioned on the left side.  Pt needing max facilitation to shift weight over the left side initially but progressed to needing only min assist by the last repetitions.  Pt transferred back to wheelchair with total assist and then back to room and into the bed.  Total assist also needed for Bobath squat pivot transfer to the left side to get into the bed.  Pt with increased pushing during transfer as to be expected.  Pt's sister present and encouraging for all therapy sessions today.  Therapy Documentation Precautions:  Precautions Precautions: Fall Precaution Comments: R sided inattention,Dense Right hemiparesis,  pusher toward Right, global aphasia deficits Restrictions Weight Bearing Restrictions: No  Pain: Pain Assessment Pain Assessment: Faces Pain Score: 0-No pain ADL: See FIM for current functional status  Therapy/Group: Co-Treatment  Beatrice Sehgal OTR/L 05/16/2013, 3:44 PM

## 2013-05-16 NOTE — Progress Notes (Signed)
Social Work Patient ID: [name], male   DOB: November 25, 1950, 63 y.o.   MRN: [id number] Met with pt and sister-Sandy who was here to observing in therapies.  She is impressed with the therapy team and how they are working with her brother. She will be present with the MBS test tomorrow, hopefully she can encourage pt to participate in test.  Aware plan is three weeks here then transfer to NH facility. She would like for him to stay here as long as possible before begin transferred, since he is getting intensive rehab and good care.  See how MBS goes tomorrow.

## 2013-05-16 NOTE — Progress Notes (Signed)
Per RT notes and RN, pt has been refusing cpap for last several nights.  Tried to speak to pt, but he would drift back to sleep.  RN notified.  Attempted to place cpap mask on him but removed it due to pt agitation.  2lnc placed per RN request.

## 2013-05-17 ENCOUNTER — Encounter (HOSPITAL_COMMUNITY): Payer: Medicare Other

## 2013-05-17 ENCOUNTER — Inpatient Hospital Stay (HOSPITAL_COMMUNITY): Payer: Medicare Other | Admitting: Speech Pathology

## 2013-05-17 ENCOUNTER — Inpatient Hospital Stay (HOSPITAL_COMMUNITY): Payer: Medicare Other

## 2013-05-17 DIAGNOSIS — I619 Nontraumatic intracerebral hemorrhage, unspecified: Secondary | ICD-10-CM

## 2013-05-17 DIAGNOSIS — M109 Gout, unspecified: Secondary | ICD-10-CM

## 2013-05-17 DIAGNOSIS — I69920 Aphasia following unspecified cerebrovascular disease: Secondary | ICD-10-CM

## 2013-05-17 DIAGNOSIS — G8110 Spastic hemiplegia affecting unspecified side: Secondary | ICD-10-CM

## 2013-05-17 MED ORDER — NYSTATIN 100000 UNIT/ML MT SUSP
5.0000 mL | Freq: Four times a day (QID) | OROMUCOSAL | Status: DC
  Administered 2013-05-17 – 2013-06-05 (×70): 500000 [IU] via ORAL
  Filled 2013-05-17 (×84): qty 5

## 2013-05-17 NOTE — Progress Notes (Signed)
Speech Language Pathology Daily Session Note  Patient Details  Name: [name] MRN: [id number] Date of Birth: 03-31-1951  Today's Date: 05/17/2013 Time: 1500-1535 Time Calculation (min): 35 min  Short Term Goals: Week 1: SLP Short Term Goal 1 (Week 1): Pt will utilize safe swallowing strategies with Dys 1 textures and honey thick liquids with Mod cues  SLP Short Term Goal 2 (Week 1): Pt will consume trials of nectar thick liquids with minimal overt s/s of aspiration with Max cues SLP Short Term Goal 3 (Week 1): Pt will sustain attention to basic functional task for 5 minutes with Max cues SLP Short Term Goal 4 (Week 1): Pt will communicate basic wants/needs via multimodal communication with Max cues SLP Short Term Goal 5 (Week 1): Pt will follow one-step commands with Max cues SLP Short Term Goal 6 (Week 1): Pt will receptively identify common objects from a field of three with Max cues  Skilled Therapeutic Interventions: After MBSS, patient's diet was changed from honey thick to nectar thick consistencies. Patient was given nectar thick Sprite during session and needed Max assist to consume small sips. Patient coughed after taking gulps of Sprite however patient's vocal quality indicated that his cough successfully prevented any penetration.    FIM:  Comprehension Comprehension Mode: Auditory Comprehension: 2-Understands basic 25 - 49% of the time/requires cueing 51 - 75% of the time Expression Expression Mode: Verbal Expression: 2-Expresses basic 25 - 49% of the time/requires cueing 50 - 75% of the time. Uses single words/gestures. Social Interaction Social Interaction: 3-Interacts appropriately 50 - 74% of the time - May be physically or verbally inappropriate. Problem Solving Problem Solving: 1-Solves basic less than 25% of the time - needs direction nearly all the time or does not effectively solve problems and may need a restraint for safety Memory Memory: 1-Recognizes or  recalls less than 25% of the time/requires cueing greater than 75% of the time FIM - Eating Eating Activity: 4: Help with managing cup/glass  Pain Pain Assessment Pain Assessment: No/denies pain  Therapy/Group: Individual Therapy  [name] 05/17/2013, 4:37 PM

## 2013-05-17 NOTE — Progress Notes (Signed)
The skilled treatment note has been reviewed and SLP is in agreement. Janmarie Smoot, M.A., CCC-SLP [phone]  

## 2013-05-17 NOTE — Progress Notes (Signed)
Social Work Patient ID: [name], male   DOB: [birth year], 63 y.o.   MRN: [id number] Met with pt, sister and [name] to discuss questions and pt's progress.  He passed his MBS today and was advanced to Dys 2 with nectar thick liquids. All very pleased with this and feel encouraged.  Aware team conference tomorrow.  Work on discharge plan and pursue NHP once team and MD feel appropriate.

## 2013-05-17 NOTE — Procedures (Signed)
Objective Swallowing Evaluation: Modified Barium Swallowing Study  Patient Details  Name: [name] MRN: [id number] Date of Birth: 09/13/1950  Today's Date: 05/17/2013 Time: 0930-1000 Time Calculation (min): 30 min  Past Medical History:  Past Medical History  Diagnosis Date  . Hypertension    Past Surgical History: No past surgical history on file. HPI:  63 y.o. male history of hypertension who was found on the floor of his home early this morning 05/01/2013 with speech difficulty and inability to move his right side. He was last seen normal at 9:30 PM on 04/30/2013. CT scan of his head showed an 8 cm left parietal hemorrhage with extension into left lateral ventricle. There was mild mass effect as well as mild vasogenic edema. Blood pressure was markedly elevated at 247/132. Cardene drip was started for blood pressure management. NIH stroke score was 22. Patient admitted to [facility] 05/06/13 and has been participating in therapies. Given sensory impairments objective re-assessment warranted prior to diet advancement.     Recommendation/Prognosis  Clinical Impression Dysphagia Diagnosis: Moderate oral phase dysphagia;Mild pharyngeal phase dysphagia Clinical impression: Patient with functional improvement as compared to previous object assessment.  He continues to demonstrate moderate oral dysphagia due to right sided sensory-motor deficits with poor cohesion, manipulation and sensation leading to right buccal cavity pocketing, anterior loss and overall impaired oral transit.  Mild pharyngeal impairments are sensory based and characterized by premature spillage to the pyriform sinuses, delayed swallow initiation.  These impairments result in flash penetration of nectar-thick liquids and silent deep silent penetration with eventual aspiration of thin liquids and trace oral residue that becomes pharyngeal residue post swallow.  Of note, patient's cognitive-linguistic deficits impact his ability to  solve problems related to self-feeding and self-monitoring, which increase his aspiration risk.  As a result, it is recommended that this patient initiate and Dys. 2 texture diet with nectar-thick liquids and full supervision.         Swallow Evaluation Recommendations Diet Recommendations: Dysphagia 2 (Fine chop);Nectar-thick liquid Liquid Administration via: Cup;No straw Medication Administration: Crushed with puree Supervision: Full supervision/cueing for compensatory strategies;Staff to assist with self feeding Compensations: Slow rate;Small sips/bites;Check for pocketing;Check for anterior loss;Clear throat intermittently Postural Changes and/or Swallow Maneuvers: Seated upright 90 degrees;Out of bed for meals;Upright 30-60 min after meal Oral Care Recommendations: Oral care BID Other Recommendations: Order thickener from pharmacy;Remove water pitcher;Prohibited food (jello, ice cream, thin soups);Have oral suction available Follow up Recommendations: 24 hour supervision/assistance;Home health SLP Prognosis Prognosis for Safe Diet Advancement: Good Barriers to Reach Goals: Cognitive deficits;Language deficits Individuals Consulted Consulted and Agree with Results and Recommendations: Patient;Family member/caregiver Family Member Consulted: sister  SLP Assessment/Plan See Care Plan for details  Short Term Goals: Week 1: SLP Short Term Goal 1 (Week 1): Pt will utilize safe swallowing strategies with Dys 1 textures and honey thick liquids with Mod cues  SLP Short Term Goal 2 (Week 1): Pt will consume trials of nectar thick liquids with minimal overt s/s of aspiration with Max cues SLP Short Term Goal 3 (Week 1): Pt will sustain attention to basic functional task for 5 minutes with Max cues SLP Short Term Goal 4 (Week 1): Pt will communicate basic wants/needs via multimodal communication with Max cues SLP Short Term Goal 5 (Week 1): Pt will follow one-step commands with Max cues SLP  Short Term Goal 6 (Week 1): Pt will receptively identify common objects from a field of three with Max cues  Skilled Intervention:  SLP initiated education with  patient and sister regarding dysphagia, current restrictions and precautions. Will continue to address in therapy.   General:  Date of Onset: 05/01/13 HPI: 63 y.o. male history of hypertension who was found on the floor of his home early this morning 05/01/2013 with speech difficulty and inability to move his right side. He was last seen normal at 9:30 PM on 04/30/2013. CT scan of his head showed an 8 cm left parietal hemorrhage with extension into left lateral ventricle. There was mild mass effect as well as mild vasogenic edema. Blood pressure was markedly elevated at 247/132. Cardene drip was started for blood pressure management. NIH stroke score was 22. [facility] and has been participating in therapies. Given sensory impairments objective re-assessment warranted prior to diet advancement. Type of Study: Modified Barium Swallowing Study Reason for Referral: Objectively evaluate swallowing function Previous Swallow Assessment: MBS 12/31, recommendations for Dys 1 textures and honey-thick liquids Diet Prior to this Study: Dysphagia 1 (puree);Honey-thick liquids Temperature Spikes Noted: No Respiratory Status: Room air History of Recent Intubation: No Behavior/Cognition: Alert;Requires cueing;Decreased sustained attention;Cooperative Oral Cavity - Dentition: Poor condition Oral Motor / Sensory Function: Impaired - see Bedside swallow eval Self-Feeding Abilities: Able to feed self;Needs assist Patient Positioning: Upright in chair Baseline Vocal Quality: Clear;Low vocal intensity Volitional Cough: Cognitively unable to elicit Volitional Swallow: Able to elicit (inconsistently) Anatomy: Within functional limits Pharyngeal Secretions: Not observed secondary MBS  Reason for Referral:  Objectively evaluate  swallowing function   Oral Phase Oral Preparation/Oral Phase Oral Phase: Impaired Oral - Honey Oral - Honey Teaspoon: Not tested Oral - Honey Cup: Right anterior bolus loss;Weak lingual manipulation;Right pocketing in lateral sulci Oral - Nectar Oral - Nectar Teaspoon: Not tested Oral - Nectar Cup: Right anterior bolus loss;Weak lingual manipulation;Right pocketing in lateral sulci Oral - Thin Oral - Thin Teaspoon: Not tested Oral - Thin Cup: Right anterior bolus loss;Weak lingual manipulation;Right pocketing in lateral sulci Oral - Solids Oral - Puree: Right anterior bolus loss;Weak lingual manipulation;Right pocketing in lateral sulci;Reduced posterior propulsion;Lingual/palatal residue Oral - Mechanical Soft: Right anterior bolus loss;Weak lingual manipulation;Impaired mastication;Reduced posterior propulsion;Right pocketing in lateral sulci;Lingual/palatal residue Pharyngeal Phase  Pharyngeal Phase Pharyngeal Phase: Impaired Pharyngeal - Honey Pharyngeal - Honey Teaspoon: Not tested Pharyngeal - Honey Cup: Delayed swallow initiation;Premature spillage to valleculae;Pharyngeal residue - valleculae Penetration/Aspiration details (honey cup): Material does not enter airway Pharyngeal - Nectar Pharyngeal - Nectar Teaspoon: Not tested Pharyngeal - Nectar Cup: Delayed swallow initiation;Premature spillage to pyriform sinuses;Penetration/Aspiration during swallow;Premature spillage to valleculae;Pharyngeal residue - valleculae;Compensatory strategies attempted (Comment) (intermittent need for cued swallow to reduce residue) Penetration/Aspiration details (nectar cup): Material enters airway, remains ABOVE vocal cords then ejected out;Material does not enter airway Pharyngeal - Thin Pharyngeal - Thin Teaspoon: Not tested Pharyngeal - Thin Cup: Premature spillage to valleculae;Delayed swallow initiation;Premature spillage to pyriform sinuses;Penetration/Aspiration during swallow;Trace  aspiration;Compensatory strategies attempted (Comment) (Max cues for small sips were intermittently effective) Penetration/Aspiration details (thin cup): Material enters airway, CONTACTS cords and not ejected out;Material enters airway, passes BELOW cords without attempt by patient to eject out (silent aspiration);Material does not enter airway Pharyngeal - Solids Pharyngeal - Puree: Delayed swallow initiation;Reduced tongue base retraction;Premature spillage to valleculae Pharyngeal - Mechanical Soft: Delayed swallow initiation;Premature spillage to valleculae;Reduced tongue base retraction Cervical Esophageal Phase  Cervical Esophageal Phase Cervical Esophageal Phase: [facility]   G-Codes   Fae PippinMelissa Eldonna Neuenfeldt, M.A., CCC-SLP [phone]  Meshawn Oconnor 05/17/2013, 12:26 PM

## 2013-05-17 NOTE — Progress Notes (Signed)
Subjective/Complaints: Cooperating better with OT,IV came out during transfer, small amt of bleeding Appears comfortable Review of Systems - not able to obtain secondary to aphasia Objective: Vital Signs: Blood pressure 145/75, pulse 64, temperature 97.4 F (36.3 C), temperature source Oral, resp. rate 18, height 6\' 5"  (1.956 m), weight 84.278 kg (185 lb 12.8 oz), SpO2 94.00%. No results found. No results found for this or any previous visit (from the past 72 hour(s)).    Constitutional: He appears well-developed and well-nourished.  HENT:  Head: Normocephalic and atraumatic.  Eyes: Conjunctivae are normal. Pupils are equal, round, and reactive to light.  Neck: Normal range of motion. Neck supple.  Cardiovascular: Regular rhythm. Tachycardia present.  Respiratory: Effort normal and breath sounds normal. No respiratory distress. He has no wheezes.   GI: Soft. Bowel sounds are normal. He exhibits no distension. There is no tenderness.  Neurological: He is alert.   Flat affect with delayed processing. Restless and distracted. Minimal verbal output. Dense right hemiparesis with sensory deficits. Moves LUE/LLE with visual/verbal cues. Unable to follow simple commands. RUE and RLE are 0/5. No gross pain sense in the right fingers or toes. Able to only state name, moderate dysarthria Skin: Skin is warm and dry     Assessment/Plan: 1. 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 Physiatrist is providing close team supervision and 24 hour management of active medical problems listed below. Physiatrist and rehab team continue to assess barriers to discharge/monitor patient progress toward functional and medical goals.  FIM: FIM - Bathing Bathing Steps Patient Completed: Chest;Left Arm;Abdomen;Left upper leg Bathing: 1: Two helpers  FIM -  Upper Body Dressing/Undressing Upper body dressing/undressing steps patient completed: Put head through opening of pull over shirt/dress Upper body dressing/undressing: 2: Max-Patient completed 25-49% of tasks FIM - Lower Body Dressing/Undressing Lower body dressing/undressing steps patient completed: Thread/unthread left pants leg Lower body dressing/undressing: 1: Two helpers  FIM - Toileting Toileting: 0: Activity did not occur  FIM - ArchivistToilet Transfers Toilet Transfers: 1-Two helpers  FIM - BankerBed/Chair Transfer Bed/Chair Transfer Assistive Devices: Arm rests Bed/Chair Transfer: 1: Two helpers  FIM - Locomotion: PrintmakerWheelchair Locomotion: Wheelchair: 1: Total Assistance/staff pushes wheelchair (Pt<25%) FIM - Locomotion: Ambulation Locomotion: Ambulation Assistive Devices: MaxiSky Ambulation/Gait Assistance: 1: +2 Total assist Locomotion: Ambulation: 0: Activity did not occur  Comprehension Comprehension Mode: Auditory Comprehension: 2-Understands basic 25 - 49% of the time/requires cueing 51 - 75% of the time  Expression Expression Mode: Verbal (global aphasia; unable to understand speech) Expression: 2-Expresses basic 25 - 49% of the time/requires cueing 50 - 75% of the time. Uses single words/gestures.  Social Interaction Social Interaction: 2-Interacts appropriately 25 - 49% of time - Needs frequent redirection.  Problem Solving Problem Solving: 1-Solves basic less than 25% of the time - needs direction nearly all the time or does not effectively solve problems and may need a restraint for safety  Memory Memory: 1-Recognizes or recalls less than 25% of the time/requires cueing greater than 75% of the time  Medical Problem List and Plan:  1. 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  Doppler no carotid stenosis 2. DVT Prophylaxis/Anticoagulation: Subcutaneous Lovenox initiated 05/05/2013. Monitor platelet counts any signs of bleeding  3.  Pain Management: Tylenol as needed  4. Neuropsych: This patient is not capable of making decisions on his own behalf.  5. Dysphagia. Dysphagia  1 honey thick liquids. Monitor closely for any aspiration. Followup speech therapy  6. Hypertension.Uncontrolled with tachycardia Norvasc 10 mg daily, hydrochlorothiazide 25 mg daily. Monitor with increased mobility, titrate BB 7. OSA. CPAP. Will follow pulmonary services as needed  8.  R knee effusion mild OA, elevated urate, possible hx gout noted by sister, pt unable to provide hx, will treat with Cox 2 , no pain to palpation or with ROM 9.  Pt with intermittent cooperation with therapy, this is likely multifactorial includes neuro def of aphasia, apraxia, but may also have mood disorder and poss behavioral/personality issues.  Overall improving         LOS (Days) 11 A FACE TO FACE EVALUATION WAS PERFORMED  Douglass Dunshee E 05/17/2013, 8:57 AM

## 2013-05-17 NOTE — Progress Notes (Signed)
Occupational Therapy Session Note  Patient Details  Name: [name] MRN: [id number] Date of Birth: 02/25/1951  Today's Date: 05/17/2013 Time: 0800-0855 Time Calculation (min): 55 min  Short Term Goals: Week 2:  OT Short Term Goal 1 (Week 2): Pt will sit statically EOB with close supervision for 5 mins in preparation for selfcare tasks. OT Short Term Goal 2 (Week 2): Pt will perform UB bathing with min assist and mod demonstrational cueing supported in wheelchair. OT Short Term Goal 3 (Week 2): Pt will perform LB bathing sit to stand with total assist pt 60 %. OT Short Term Goal 4 (Week 2): Pt will perform toilet transfer with max assist to the right side. OT Short Term Goal 5 (Week 2): Pt/family will return demonstrate safe performance of PROM/AAROM exercises for the RUE.  Skilled Therapeutic Interventions/Progress Updates:    Pt initially reluctant to engage/participate in bathing and dressing tasks but participated after encouragement.  Pt required tot A + 2 for Bobath transfer to w/c.  Pt's IV was disturbed during transfer and RN notified and attended to patient. Pt required max verbal cues to initiate tasks throughout session.  Pt required tot A + 2 and max verbal cues for weight shifts for standing to pull up pants.  Pt expressed frustration during session with inability to communicate and perform tasks.  Therapy Documentation Precautions:  Precautions Precautions: Fall Precaution Comments: R sided inattention,Dense Right hemiparesis,  pusher toward Right, global aphasia deficits Restrictions Weight Bearing Restrictions: No Pain: Pain Assessment Pain Assessment: Faces Faces Pain Scale: Hurts a little bit  See FIM for current functional status  Therapy/Group: Individual Therapy  Rich BraveLanier, Elyce Zollinger Chappell 05/17/2013, 9:02 AM

## 2013-05-17 NOTE — Progress Notes (Signed)
Patient is currently refusing CPAP.  RT to explained to patient the importance of the CPAP and the patient stated didn't want to wear it and he went back to sleep.  RT will continue to monitor.

## 2013-05-17 NOTE — Progress Notes (Signed)
Physical Therapy Note  Patient Details  Name: [name] MRN: [id number] Date of Birth: 1950-11-05 Today's Date: 05/17/2013 1100-1200 60 min co-treatment with OT for skilled +2 for transfers, sit>< stand, static and dynamic standing, w/c propulsion  +2 bed> w/c squat pivot to R, level transfer.  +2 w/c> mat squat pivot to L, slightly uphill,  pushing with L extremities making transfer more difficult.  Midline orientation in static sitting with +1total > stand by assistance briefly.  Dynamic sitting, +2> +1 guarding while reaching L and down with L hand to decrease pushing and re-orient to midline.  Mirror provided but pt unable to use for visual cue.   Sit> stand with +2 assistance, pt appropriately shifting weight initially.  Static and dynamic standing, reaching overhead with L hand +2 assistance. GivMohr orthosis used during standing to support RUE and assist with midline orientation.  Pt tolerated donning and wearing orthosis.  W/c propulsion x 55' with one quick rest break, using hemi technique of LUE and LLE, mod assist for steering.  Pt required tactile cues to use L foot 25% of the time.  Pt has new shoes but they are too big for him; he would be more successful with shoes with rubber soles.  Kaylani Fromme 05/17/2013, 7:59 AM

## 2013-05-17 NOTE — Progress Notes (Signed)
Delle ReiningPamela Love, PA notified of patient received Xanax 0.25 mg at 1956; due Remeron 15 mg now; patient sleeping soundly.  Order received:  Hold Remeron dose tonight.

## 2013-05-18 ENCOUNTER — Encounter (HOSPITAL_COMMUNITY): Payer: Medicare Other | Admitting: Occupational Therapy

## 2013-05-18 ENCOUNTER — Inpatient Hospital Stay (HOSPITAL_COMMUNITY): Payer: Medicare Other | Admitting: Occupational Therapy

## 2013-05-18 ENCOUNTER — Ambulatory Visit (HOSPITAL_COMMUNITY): Payer: Medicare Other | Admitting: Rehabilitation

## 2013-05-18 ENCOUNTER — Inpatient Hospital Stay (HOSPITAL_COMMUNITY): Payer: Medicare Other | Admitting: Speech Pathology

## 2013-05-18 NOTE — Progress Notes (Signed)
Speech Language Pathology Daily Session Note  Patient Details  Name: [name] MRN: [id number] Date of Birth: July 19, 1950  Today's Date: 05/18/2013 Time: [id number] Time Calculation (min): 57 min  Short Term Goals: Week 1: SLP Short Term Goal 1 (Week 1): Pt will utilize safe swallowing strategies with Dys 1 textures and honey thick liquids with Mod cues  SLP Short Term Goal 2 (Week 1): Pt will consume trials of nectar thick liquids with minimal overt s/s of aspiration with Max cues SLP Short Term Goal 3 (Week 1): Pt will sustain attention to basic functional task for 5 minutes with Max cues SLP Short Term Goal 4 (Week 1): Pt will communicate basic wants/needs via multimodal communication with Max cues SLP Short Term Goal 5 (Week 1): Pt will follow one-step commands with Max cues SLP Short Term Goal 6 (Week 1): Pt will receptively identify common objects from a field of three with Max cues  Skilled Therapeutic Interventions: During breakfast, patient drank both nectar thick milk and orange juice and needed MAX assist tactile cues to remember to take small sips. Patient had anterior spillage with no awareness. Cueing attempts were unsuccessful. No s/s of aspiration. Patient successfully identified common household objects from a field of three and needed MIN assist to identify objects from a field of four. When asked to name common household objects patient needed MAX assist with semantic and phonemic cues. Written single words appear to be the most effective cueing strategy. Patient successfully followed 1 and 2 step directions when identifying body parts but was unsuccessful when identifying objects other than body parts (bed, window).     FIM:  Comprehension Comprehension Mode: Auditory Comprehension: 2-Understands basic 25 - 49% of the time/requires cueing 51 - 75% of the time Expression Expression Mode: Verbal Expression: 2-Expresses basic 25 - 49% of the time/requires cueing 50 - 75%  of the time. Uses single words/gestures. Social Interaction Social Interaction: 3-Interacts appropriately 50 - 74% of the time - May be physically or verbally inappropriate. Problem Solving Problem Solving: 1-Solves basic less than 25% of the time - needs direction nearly all the time or does not effectively solve problems and may need a restraint for safety Memory Memory: 1-Recognizes or recalls less than 25% of the time/requires cueing greater than 75% of the time FIM - Eating Eating Activity: 3: Helper brings food to mouth every scoop  Pain Pain Assessment Pain Assessment: No/denies pain   Therapy/Group: Individual Therapy  Esperansa Sarabia 05/18/2013, 12:45 PM

## 2013-05-18 NOTE — Progress Notes (Signed)
Patient refuses CPAP.  RT will continue to monitor. 

## 2013-05-18 NOTE — Progress Notes (Signed)
The skilled treatment note has been reviewed and SLP is in agreement. Lavontae Cornia, M.A., CCC-SLP [phone]  

## 2013-05-18 NOTE — Progress Notes (Signed)
Occupational Therapy Session Note  Patient Details  Name: [name] MRN: [id number] Date of Birth: 05/05/1951  Today's Date: 05/18/2013 Time: 1330-1400 Time Calculation (min): 30 min  Skilled Therapeutic Interventions/Progress Updates:    Pt went down to the gym and transferred to the mat with total assist Bobath technique to the left side.  Used maxi sky with walking sling throughout session with focus on standing balance, weight shifting to the left non-affected side, and maintaining trunk, cervical, hip, and right knee extension.  Pt needing total assist for advancing the RLE as well as maintaining knee extension in stance phase in order to advance the LLE.  Pt at times with motor planning difficulty and when told and cued to advance the RLE he would attempt to advance the LLE and both knees would buckle.  Attempted to have pt reach up and to the left with the LUE while walking to help facilitate trunk and cervical extension however pt with difficulty maintaining.  Pt still with increased pushing to the right in both sitting and standing.  Pt inconsistent with amount of assistance he needs to maintain static sitting balance.  At times he can sit with min guard to supervision for 30 seconds up to 1 min.  Other times he needs max facilitation to sit at midline with pt extending his left elbow to push himself to the right side.  Therapy Documentation Precautions:  Precautions Precautions: Fall Precaution Comments: R sided inattention,Dense Right hemiparesis,  pusher toward Right, global aphasia deficits Restrictions Weight Bearing Restrictions: No  Pain: Pain Assessment Pain Assessment: Faces Pain Score: 0-No pain Faces Pain Scale: Hurts a little bit Pain Type: Chronic pain Pain Location: Knee Pain Orientation: Right Pain Intervention(s): Repositioned ADL: See FIM for current functional status  Therapy/Group: Co-treatment   Bernadette Armijo OTR/L 05/18/2013, 3:47 PM

## 2013-05-18 NOTE — Progress Notes (Signed)
Occupational Therapy Session Note  Patient Details  Name: Linna Capricedward A Mccaig MRN: [id number] Date of Birth: Jan 14, 1951  Today's Date: 05/18/2013 Time: 1430-1450 Time Calculation (min): 20 min  (missed 10 min due to refusal)  Short Term Goals: Week 2:  OT Short Term Goal 1 (Week 2): Pt will sit statically EOB with close supervision for 5 mins in preparation for selfcare tasks. OT Short Term Goal 2 (Week 2): Pt will perform UB bathing with min assist and mod demonstrational cueing supported in wheelchair. OT Short Term Goal 3 (Week 2): Pt will perform LB bathing sit to stand with total assist pt 60 %. OT Short Term Goal 4 (Week 2): Pt will perform toilet transfer with max assist to the right side. OT Short Term Goal 5 (Week 2): Pt/family will return demonstrate safe performance of PROM/AAROM exercises for the RUE.  Skilled Therapeutic Interventions/Progress Updates:  Received patient from PT/OT session.  Patient insistent on getting back to bed therefore performed squat pivot w/c>bed traveling to patient's right using Bobath method and max assist.  Attempted to work on sitting balance EOB however patient would not and proceeded to lay down needing assist with RLE then assist for bed positioning.  Attempted to engage patient in RUE SROM exercises and wash face.  Patient would not open his eyes to exercise therefore RUE PROM provided and patient wiped his eyes and would not wipe his mouth which was crusty with food/drink.  Bed alarm on and 3SR up.   Therapy Documentation Precautions:  Precautions Precautions: Fall Precaution Comments: R sided inattention,Dense Right hemiparesis,  pusher toward Right, global aphasia deficits Restrictions Weight Bearing Restrictions: No Pain: No c/o pain  Therapy/Group: Individual Therapy  Zahraa Bhargava 05/18/2013, 3:46 PM

## 2013-05-18 NOTE — Progress Notes (Signed)
Physical Therapy Session Note  Patient Details  Name: [name] A Saldivar MRN: [id number] Date of Birth: 07/04/1950  Today's Date: 05/18/2013 Time: 1400-1430 Time Calculation (min): 30 min  Short Term Goals: Week 2:  PT Short Term Goal 1 (Week 2): Patient will be able to perform bed mobility with mod assist. PT Short Term Goal 2 (Week 2): Patient will be able to perform transfers with Max-assist of one person PT Short Term Goal 3 (Week 2): Patient will be able to perform static sitting balance x 3-5 mins with min assist. PT Short Term Goal 4 (Week 2): Pt will perform dynamic sitting balance at max assist level consistently.  PT Short Term Goal 5 (Week 2): Patient will be able to propel w/c 7950' with Mod-assist.  Skilled Therapeutic Interventions/Progress Updates:   Pt went down to the gym and transferred to the mat with total assist (+2 for safety, 40%) Bobath technique to the left side. Used maxi sky with walking sling throughout session with focus on standing balance, weight shifting to the left non-affected side, and maintaining trunk, cervical, hip, and right knee extension. Pt needing total assist for advancing the RLE (however he can initiate hip flex) as well as maintaining knee extension in stance phase in order to advance the LLE. Pt at times with motor planning difficulty and when told and cued to advance the RLE he would attempt to advance the LLE and both knees would buckle, therefore PT having to block L LE when advancing RLE.   Attempted to have pt reach up and to the left with the LUE while walking to help facilitate trunk and cervical extension however pt with difficulty maintaining. Pt still with increased pushing to the right in both sitting and standing. Pt inconsistent with amount of assistance he needs to maintain static sitting balance. At times he can sit with min guard to supervision for 30 seconds up to 1 min. Other times he needs max facilitation to sit at midline with pt  extending his left elbow to push himself to the right side.  Transferred pt back to w/c via method mentioned above with lap tray and quick release belt in place.  Handed pt off to OT for next session.     Therapy Documentation Precautions:  Precautions Precautions: Fall Precaution Comments: R sided inattention,Dense Right hemiparesis,  pusher toward Right, global aphasia deficits Restrictions Weight Bearing Restrictions: No   Vital Signs: Therapy Vitals Pulse Rate: 69 Resp: 18 BP: 142/75 mmHg Patient Position, if appropriate: Lying Oxygen Therapy SpO2: 96 % O2 Device: None (Room air) Pain: Pain Assessment Pain Assessment: Faces Pain Score: 0-No pain   Locomotion : Ambulation Ambulation/Gait Assistance: 1: +2 Total assist   See FIM for current functional status  Therapy/Group: Co-Treatment w/ OT  Vista DeckParcell, Bomani Oommen Ann 05/18/2013, 4:33 PM

## 2013-05-18 NOTE — Progress Notes (Signed)
Occupational Therapy Session Note  Patient Details  Name: Linna Capricedward A Tandon MRN: [id number] Date of Birth: 04-26-1951  Today's Date: 05/18/2013 Time: 1003-1107 Time Calculation (min): 64 min  Short Term Goals: Week 2:  OT Short Term Goal 1 (Week 2): Pt will sit statically EOB with close supervision for 5 mins in preparation for selfcare tasks. OT Short Term Goal 2 (Week 2): Pt will perform UB bathing with min assist and mod demonstrational cueing supported in wheelchair. OT Short Term Goal 3 (Week 2): Pt will perform LB bathing sit to stand with total assist pt 60 %. OT Short Term Goal 4 (Week 2): Pt will perform toilet transfer with max assist to the right side. OT Short Term Goal 5 (Week 2): Pt/family will return demonstrate safe performance of PROM/AAROM exercises for the RUE.  Skilled Therapeutic Interventions/Progress Updates:    Pt worked on bathing and dressing sit to stand at the sink.  Max demonstrational cueing for sequencing bathing and dressing during session.  Max assist for sitting EOB during session.  Pt with increased pushing to the right side in sitting and frequent LOB.  Gets easily frustrated when therapist asks him to wash his face or reach for the washcloth.  At times needed initial hand over hand assist to initiate task secondary to receptive difficulties but also seems to demonstrate some behavioral issues as well.  Still needing total assist +2 (pt 40%) for sit to stand and standing during LB selfcare.  Total assist for transfer to the wheelchair to the right with Bobath technique.    Therapy Documentation Precautions:  Precautions Precautions: Fall Precaution Comments: R sided inattention,Dense Right hemiparesis,  pusher toward Right, global aphasia deficits Restrictions Weight Bearing Restrictions: No   Pain: Pain Assessment Pain Assessment: Faces Faces Pain Scale: Hurts a little bit Pain Type: Chronic pain Pain Location: Knee Pain Orientation: Right Pain  Intervention(s): Repositioned ADL: See FIM for current functional status  Therapy/Group: Individual Therapy  Ketara Cavness OTR/L 05/18/2013, 12:30 PM

## 2013-05-18 NOTE — Progress Notes (Signed)
Subjective/Complaints: Identifying objects with SLP, Upgraded diet Review of Systems - not able to obtain secondary to aphasia Objective: Vital Signs: Blood pressure 148/70, pulse 66, temperature 98 F (36.7 C), temperature source Oral, resp. rate 19, height 6\' 5"  (1.956 m), weight 84.278 kg (185 lb 12.8 oz), SpO2 96.00%. Dg Swallowing Func-speech Pathology  05/17/2013   [name], CCC-SLP     05/17/2013 12:29 PM Objective Swallowing Evaluation: Modified Barium Swallowing Study   Patient Details  Name: [name] MRN: [id number] Date of Birth: [birth year]  Today's Date: 05/17/2013 Time: 0930-1000 Time Calculation (min): 30 min  Past Medical History:  Past Medical History  Diagnosis Date  . Hypertension    Past Surgical History: No past surgical history on file. HPI:  63 y.o. male history of hypertension who was found on the floor  of his home early this morning 05/01/2013 with speech difficulty  and inability to move his right side. He was last seen normal at  9:30 PM on 04/30/2013. CT scan of his head showed an 8 cm left  parietal hemorrhage with extension into left lateral ventricle.  There was mild mass effect as well as mild vasogenic edema. Blood  pressure was markedly elevated at 247/132. Cardene drip was  started for blood pressure management. NIH stroke score was 22.  Patient admitted to [facility] 05/06/13 and has been participating in  therapies. Given sensory impairments objective re-assessment  warranted prior to diet advancement.     Recommendation/Prognosis  Clinical Impression Dysphagia Diagnosis: Moderate oral phase dysphagia;Mild  pharyngeal phase dysphagia Clinical impression: Patient with functional improvement as  compared to previous object assessment.  He continues to  demonstrate moderate oral dysphagia due to right sided  sensory-motor deficits with poor cohesion, manipulation and  sensation leading to right buccal cavity pocketing, anterior loss  and overall impaired oral transit.   Mild pharyngeal impairments  are sensory based and characterized by premature spillage to the  pyriform sinuses, delayed swallow initiation.  These impairments  result in flash penetration of nectar-thick liquids and silent  deep silent penetration with eventual aspiration of thin liquids  and trace oral residue that becomes pharyngeal residue post  swallow.  Of note, patient's cognitive-linguistic deficits impact  his ability to solve problems related to self-feeding and  self-monitoring, which increase his aspiration risk.  As a  result, it is recommended that this patient initiate and Dys. 2  texture diet with nectar-thick liquids and full supervision.          Swallow Evaluation Recommendations Diet Recommendations: Dysphagia 2 (Fine chop);Nectar-thick liquid Liquid Administration via: Cup;No straw Medication Administration: Crushed with puree Supervision: Full supervision/cueing for compensatory  strategies;Staff to assist with self feeding Compensations: Slow rate;Small sips/bites;Check for  pocketing;Check for anterior loss;Clear throat intermittently Postural Changes and/or Swallow Maneuvers: Seated upright 90  degrees;Out of bed for meals;Upright 30-60 min after meal Oral Care Recommendations: Oral care BID Other Recommendations: Order thickener from pharmacy;Remove water  pitcher;Prohibited food (jello, ice cream, thin soups);Have oral  suction available Follow up Recommendations: 24 hour supervision/assistance;Home  health SLP Prognosis Prognosis for Safe Diet Advancement: Good Barriers to Reach Goals: Cognitive deficits;Language deficits Individuals Consulted Consulted and Agree with Results and Recommendations:  Patient;Family member/caregiver Family Member Consulted: sister  SLP Assessment/Plan See Care Plan for details  Short Term Goals: Week 1: SLP Short Term Goal 1 (Week 1): Pt will utilize safe  swallowing strategies with Dys 1 textures and honey thick liquids  with Mod cues  SLP  Short Term Goal 2  (Week 1): Pt will consume trials of nectar  thick liquids with minimal overt s/s of aspiration with Max cues SLP Short Term Goal 3 (Week 1): Pt will sustain attention to  basic functional task for 5 minutes with Max cues SLP Short Term Goal 4 (Week 1): Pt will communicate basic  wants/needs via multimodal communication with Max cues SLP Short Term Goal 5 (Week 1): Pt will follow one-step commands  with Max cues SLP Short Term Goal 6 (Week 1): Pt will receptively identify  common objects from a field of three with Max cues  Skilled Intervention:  SLP initiated education with patient and sister regarding  dysphagia, current restrictions and precautions. Will continue to  address in therapy.   General:  Date of Onset: 05/01/13 HPI: 63 y.o. male history of hypertension who was found on the  floor of his home early this morning 05/01/2013 with speech  difficulty and inability to move his right side. He was last seen  normal at 9:30 PM on 04/30/2013. CT scan of his head showed an 8  cm left parietal hemorrhage with extension into left lateral  ventricle. There was mild mass effect as well as mild vasogenic  edema. Blood pressure was markedly elevated at 247/132. Cardene  drip was started for blood pressure management. NIH stroke score  was 22. Patient admitted to [facility] 05/06/13 and has been participating  in therapies. Given sensory impairments objective re-assessment  warranted prior to diet advancement. Type of Study: Modified Barium Swallowing Study Reason for Referral: Objectively evaluate swallowing function Previous Swallow Assessment: MBS 12/31, recommendations for Dys 1  textures and honey-thick liquids Diet Prior to this Study: Dysphagia 1 (puree);Honey-thick liquids Temperature Spikes Noted: No Respiratory Status: Room air History of Recent Intubation: No Behavior/Cognition: Alert;Requires cueing;Decreased sustained  attention;Cooperative Oral Cavity - Dentition: Poor condition Oral Motor / Sensory Function:  Impaired - see Bedside swallow  eval Self-Feeding Abilities: Able to feed self;Needs assist Patient Positioning: Upright in chair Baseline Vocal Quality: Clear;Low vocal intensity Volitional Cough: Cognitively unable to elicit Volitional Swallow: Able to elicit (inconsistently) Anatomy: Within functional limits Pharyngeal Secretions: Not observed secondary MBS  Reason for Referral:  Objectively evaluate swallowing function   Oral Phase Oral Preparation/Oral Phase Oral Phase: Impaired Oral - Honey Oral - Honey Teaspoon: Not tested Oral - Honey Cup: Right anterior bolus loss;Weak lingual  manipulation;Right pocketing in lateral sulci Oral - Nectar Oral - Nectar Teaspoon: Not tested Oral - Nectar Cup: Right anterior bolus loss;Weak lingual  manipulation;Right pocketing in lateral sulci Oral - Thin Oral - Thin Teaspoon: Not tested Oral - Thin Cup: Right anterior bolus loss;Weak lingual  manipulation;Right pocketing in lateral sulci Oral - Solids Oral - Puree: Right anterior bolus loss;Weak lingual  manipulation;Right pocketing in lateral sulci;Reduced posterior  propulsion;Lingual/palatal residue Oral - Mechanical Soft: Right anterior bolus loss;Weak lingual  manipulation;Impaired mastication;Reduced posterior  propulsion;Right pocketing in lateral sulci;Lingual/palatal  residue Pharyngeal Phase  Pharyngeal Phase Pharyngeal Phase: Impaired Pharyngeal - Honey Pharyngeal - Honey Teaspoon: Not tested Pharyngeal - Honey Cup: Delayed swallow initiation;Premature  spillage to valleculae;Pharyngeal residue - valleculae Penetration/Aspiration details (honey cup): Material does not  enter airway Pharyngeal - Nectar Pharyngeal - Nectar Teaspoon: Not tested Pharyngeal - Nectar Cup: Delayed swallow initiation;Premature  spillage to pyriform sinuses;Penetration/Aspiration during  swallow;Premature spillage to valleculae;Pharyngeal residue -  valleculae;Compensatory strategies attempted (Comment)  (intermittent need for cued swallow  to reduce residue) Penetration/Aspiration details (nectar cup): Material enters  airway, remains ABOVE  vocal cords then ejected out;Material does  not enter airway Pharyngeal - Thin Pharyngeal - Thin Teaspoon: Not tested Pharyngeal - Thin Cup: Premature spillage to valleculae;Delayed  swallow initiation;Premature spillage to pyriform  sinuses;Penetration/Aspiration during swallow;Trace  aspiration;Compensatory strategies attempted (Comment) (Max cues  for small sips were intermittently effective) Penetration/Aspiration details (thin cup): Material enters  airway, CONTACTS cords and not ejected out;Material enters  airway, passes BELOW cords without attempt by patient to eject  out (silent aspiration);Material does not enter airway Pharyngeal - Solids Pharyngeal - Puree: Delayed swallow initiation;Reduced tongue  base retraction;Premature spillage to valleculae Pharyngeal - Mechanical Soft: Delayed swallow  initiation;Premature spillage to valleculae;Reduced tongue base  retraction Cervical Esophageal Phase  Cervical Esophageal Phase [facility]erv[facility]ophageal [facility]   G-Codes   Charlane FerrettiMelissa Bowie, M.A., CCC-SLP [phone]  BOWIE,MELISSA 05/17/2013, 12:26 PM     No results found for this or any previous visit (from the past 72 hour(s)).    Constitutional: He appears well-developed and well-nourished.  HENT:  Head: Normocephalic and atraumatic.  Eyes: Conjunctivae are normal. Pupils are equal, round, and reactive to light.  Neck: Normal range of motion. Neck supple.  Cardiovascular: Regular rhythm. Tachycardia present.  Respiratory: Effort normal and breath sounds normal. No respiratory distress. He has no wheezes.   GI: Soft. Bowel sounds are normal. He exhibits no distension. There is no tenderness.  Neurological: He is alert.   Flat affect with delayed processing. Restless and distracted. Minimal verbal output. Dense right hemiparesis with sensory deficits. Moves LUE/LLE with visual/verbal cues. Unable to  follow simple commands. RUE and RLE are 0/5. No gross pain sense in the right fingers or toes. Able to only state name, moderate dysarthria Skin: Skin is warm and dry     Assessment/Plan: 1. 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 Physiatrist is providing close team supervision and 24 hour management of active medical problems listed below. Physiatrist and rehab team continue to assess barriers to discharge/monitor patient progress toward functional and medical goals.  FIM: FIM - Bathing Bathing Steps Patient Completed: Chest;Right Arm;Abdomen Bathing: 2: Max-Patient completes 3-4 [id number] 10 parts or 25-49%  FIM - Upper Body Dressing/Undressing Upper body dressing/undressing steps patient completed: Thread/unthread left sleeve of pullover shirt/dress Upper body dressing/undressing: 2: Max-Patient completed 25-49% of tasks FIM - Lower Body Dressing/Undressing Lower body dressing/undressing steps patient completed: Thread/unthread left pants leg Lower body dressing/undressing: 1: Two helpers  FIM - Toileting Toileting: 0: Activity did not occur  FIM - ArchivistToilet Transfers Toilet Transfers: 1-Two helpers  FIM - BankerBed/Chair Transfer Bed/Chair Transfer Assistive Devices: Arm rests Bed/Chair Transfer: 1: Two helpers  FIM - Locomotion: Wheelchair Locomotion: Wheelchair: 2: Travels 50 - 149 ft with moderate assistance (Pt: 50 - 74%) FIM - Locomotion: Ambulation Locomotion: Ambulation Assistive Devices: MaxiSky Ambulation/Gait Assistance: 1: +2 Total assist Locomotion: Ambulation: 0: Activity did not occur  Comprehension Comprehension Mode: Auditory Comprehension: 2-Understands basic 25 - 49% of the time/requires cueing 51 - 75% of the time  Expression Expression Mode: Verbal Expression: 2-Expresses basic 25 - 49% of the time/requires  cueing 50 - 75% of the time. Uses single words/gestures.  Social Interaction Social Interaction: 2-Interacts appropriately 25 - 49% of time - Needs frequent redirection.  Problem Solving Problem Solving: 1-Solves basic less than 25% of the time - needs direction nearly all the time or does not effectively solve problems and may need  a restraint for safety  Memory Memory: 1-Recognizes or recalls less than 25% of the time/requires cueing greater than 75% of the time  Medical Problem List and Plan:  1. 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  Doppler no carotid stenosis 2. DVT Prophylaxis/Anticoagulation: Subcutaneous Lovenox initiated 05/05/2013. Monitor platelet counts any signs of bleeding  3. Pain Management: Tylenol as needed  4. Neuropsych: This patient is not capable of making decisions on his own behalf.  5. Dysphagia. Dysphagia 1 honey thick liquids. Monitor closely for any aspiration. Followup speech therapy  6. Hypertension.Uncontrolled with tachycardia Norvasc 10 mg daily, hydrochlorothiazide 25 mg daily. Monitor with increased mobility, titrate BB 7. OSA. CPAP. Will follow pulmonary services as needed  8.  R knee effusion mild OA, elevated urate, possible hx gout noted by sister, pt unable to provide hx, will treat with Cox 2 , no pain to palpation or with ROM        LOS (Days) 12 A FACE TO FACE EVALUATION WAS PERFORMED  Nichols Corter E 05/18/2013, 9:42 AM

## 2013-05-19 ENCOUNTER — Encounter (HOSPITAL_COMMUNITY): Payer: Medicare Other | Admitting: Occupational Therapy

## 2013-05-19 ENCOUNTER — Inpatient Hospital Stay (HOSPITAL_COMMUNITY): Payer: Medicare Other | Admitting: Rehabilitation

## 2013-05-19 ENCOUNTER — Inpatient Hospital Stay (HOSPITAL_COMMUNITY): Payer: Medicare Other | Admitting: Speech Pathology

## 2013-05-19 MED ORDER — ENSURE PUDDING PO PUDG
1.0000 | Freq: Three times a day (TID) | ORAL | Status: DC
  Administered 2013-05-19 – 2013-06-06 (×57): 1 via ORAL

## 2013-05-19 MED ORDER — MEGESTROL ACETATE 400 MG/10ML PO SUSP
400.0000 mg | Freq: Two times a day (BID) | ORAL | Status: DC
  Administered 2013-05-19 – 2013-06-05 (×35): 400 mg via ORAL
  Filled 2013-05-19 (×39): qty 10

## 2013-05-19 NOTE — Progress Notes (Signed)
Pt asked if he would like to try CPAP again, he said yes but within the same conversation I asked pt if mask was uncomfortable pt stated yes. Pt is very difficult to understand and I am unaware if pt is fully understanding what I am saying. At this time pt is on CPAP and is tolerating well. RT will continue to monitor.

## 2013-05-19 NOTE — Progress Notes (Signed)
Speech Language Pathology Weekly Progress Note  Patient Details  Name: [name] MRN: [id number] Date of Birth: [birth year]  Today's Date: 05/19/2013  Short Term Goals: Week 1: SLP Short Term Goal 1 (Week 1): Pt will utilize safe swallowing strategies with Dys 1 textures and honey thick liquids with Mod cues  SLP Short Term Goal 1 - Progress (Week 1): Met SLP Short Term Goal 2 (Week 1): Pt will consume trials of nectar thick liquids with minimal overt s/s of aspiration with Max cues SLP Short Term Goal 2 - Progress (Week 1): Met SLP Short Term Goal 3 (Week 1): Pt will sustain attention to basic functional task for 5 minutes with Max cues SLP Short Term Goal 3 - Progress (Week 1): Progressing toward goal SLP Short Term Goal 4 (Week 1): Pt will communicate basic wants/needs via multimodal communication with Max cues SLP Short Term Goal 4 - Progress (Week 1): Met SLP Short Term Goal 5 (Week 1): Pt will follow one-step commands with Max cues SLP Short Term Goal 5 - Progress (Week 1): Progressing toward goal SLP Short Term Goal 6 (Week 1): Pt will receptively identify common objects from a field of three with Max cues SLP Short Term Goal 6 - Progress (Week 1): Met  Week 2: SLP Short Term Goal 1 (Week 2): Pt will utilize safe swallowing strategies with Dys 2 textures and nectar-thick liquids with Mod cues  SLP Short Term Goal 2 (Week 2): Pt will consume trials of thin liquids with small sips and minimal overt s/s of aspiration with Max cues SLP Short Term Goal 3 (Week 2): Pt will sustain attention to basic functional task for 5 minutes with Max cues SLP Short Term Goal 4 (Week 2): Pt will communicate basic wants/needs via multimodal communication with Mod cues SLP Short Term Goal 5 (Week 2): Pt will follow one-step commands with Max cues SLP Short Term Goal 6 (Week 2): Pt will receptively identify common objects from a field of four with Supervision  cues  Weekly Progress Updates: Patient 4  out of 6 short term goals this reporting period due to gains in diet toleration and advancement to Dys.2 textures with nectar-thick liquids, ability to communicate intermittent spontaneous wants and needs via verbal and non-verbal means, as well as his ability to accurately identify the correct object from a field of 3 objects.  Patient is progressing toward participating in and sustaining attention to a task for 5 minutes and following 1 step commands involving location of things in his environment.  While he is making slow gains he continues to demonstrate significantly impaired cognitive-linguistic skills as well as dysphagia which continue to require skilled SLP services to maximize functional independence and reduce burden of care prior to discharge to SNF.      SLP Intensity: Minumum of 1-2 x/day, 30 to 90 minutes SLP Frequency: 5 out of 7 days SLP Duration/Estimated Length of Stay: SNF SLP Treatment/Interventions: Cognitive remediation/compensation;Cueing hierarchy;Dysphagia/aspiration precaution training;Environmental controls;Functional tasks;Internal/external aids;Multimodal communication approach;Patient/family education;Speech/Language facilitation;Therapeutic Activities  Carmelia Roller., CCC-SLP [phone]  [place] 05/19/2013, 8:19 AM

## 2013-05-19 NOTE — Progress Notes (Addendum)
NUTRITION FOLLOW UP  Intervention:   1. General healthful diet; encourage intake of foods and beverages as able. RD to follow and assess for nutritional adequacy.  2. Supplements; increase Ensure Pudding po t 4 times daily, each supplement provides 170 kcal and 4 grams of protein.  3. Enteral nutrition; if pt continues to refuse nutrition PO, consider initiation of TFs. Please consult RD if warranted.   NUTRITION DIAGNOSIS:  Inadequate oral intake related to poor appetite as evidenced by refusing meals, PO 0-5%.   Monitor:  1. Food/Beverage; pt meeting >/=90% estimated needs with tolerance.  2. Wt/wt change; monitor trends  Assessment:   Pt admitted with functional deficits r/t to left MCA infarct, R hemiplegia, aphasia, and dysphagia. Pt sleeping soundly at time of visit.  Pt with minimal improvement in his PO intake since last assessment.  Largely refusing meals due to frustration and MS. PO 0-15% of meals.  Starting Megace today.  Remains aphasic.  Pursuing SNF placement.  Height: Ht Readings from Last 1 Encounters:  05/06/13 6\' 5"  (1.956 m)    Weight Status:   Wt Readings from Last 1 Encounters:  05/13/13 185 lb 12.8 oz (84.278 kg)    Re-estimated needs:  Kcal: 2000-2200  Protein: 105-125g  Fluid: >2.0 L/day  Skin: intact  Diet Order: Dysphagia 2, nectar-thick   Intake/Output Summary (Last 24 hours) at 05/19/13 1617 Last data filed at 05/19/13 1351  Gross per 24 hour  Intake    360 ml  Output    250 ml  Net    110 ml    Last BM: 1/12   Labs:   Recent Labs Lab 05/13/13 0645  CREATININE 1.17    CBG (last 3)  No results found for this basename: GLUCAP,  in the last 72 hours  Scheduled Meds: . ALPRAZolam  0.25 mg Oral TID  . amLODipine  10 mg Oral Daily  . antiseptic oral rinse  15 mL Mouth Rinse q12n4p  . chlorhexidine  15 mL Mouth Rinse BID  . enoxaparin (LOVENOX) injection  40 mg Subcutaneous Q24H  . feeding supplement (ENSURE)  1 Container  Oral TID BM  . hydrochlorothiazide  25 mg Oral Daily  . labetalol  300 mg Oral BID  . megestrol  400 mg Oral BID  . mirtazapine  15 mg Oral QHS  . nystatin  5 mL Oral QID  . pantoprazole sodium  40 mg Per Tube Daily    Continuous Infusions: . sodium chloride Stopped (05/18/13 0640)    Loyce DysKacie Sloan Takagi, MS RD LDN Clinical Inpatient Dietitian Pager: [phone] Weekend/After hours pager: [phone]

## 2013-05-19 NOTE — Progress Notes (Signed)
Social Work Patient ID: [name], male   DOB: 1950-07-31, 63 y.o.   MRN: [id number] Met with [name] and her daughter's to infrom of team conference progress toward goals.  Discussed will begin NHP search and they would like [facility]. Will being there and expand the search.  [name] concerned pt may have lost his glasses today, RN aware.

## 2013-05-19 NOTE — Progress Notes (Signed)
Physical Therapy Session Note  Patient Details  Name: [name] MRN: [id number] Date of Birth: 04/18/1951  Today's Date: 05/19/2013 Time: [id number] Time Calculation (min): 26 min  Short Term Goals: Week 2:  PT Short Term Goal 1 (Week 2): Patient will be able to perform bed mobility with mod assist. PT Short Term Goal 2 (Week 2): Patient will be able to perform transfers with Max-assist of one person PT Short Term Goal 3 (Week 2): Patient will be able to perform static sitting balance x 3-5 mins with min assist. PT Short Term Goal 4 (Week 2): Pt will perform dynamic sitting balance at max assist level consistently.  PT Short Term Goal 5 (Week 2): Patient will be able to propel w/c 7350' with Mod-assist.  Skilled Therapeutic Interventions/Progress Updates:   Pt receiving sleeping in bed, but was able to be aroused via voice and touch.  Provided max encouragement and "bargaining" to get pt to agree to get OOB this afternoon.  Pt then able to bring LLE out of bed on his own with assist for RLE.  Attempted to use bedrail to self assist, however he kept falling backwards.  Provided hand held assist with pt able to activate abdominals to elevate trunk into sitting.  Continue to provide max verbal cues for increased forward weight shift via use of Bobath method of transfer.  Once sitting in w/c, donned pts L shoe in order to participate in w/c mobility with L hemi technique.  Pt requires max verbal, demonstration and hand over hand cues for correct technique.  Pt with increased difficulty with apraxia and decreased motor planning, however was able to propel x 100' with mod to max assist.  Pt left at nursing station with quick release belt and half lap tray in place.    Therapy Documentation Precautions:  Precautions Precautions: Fall Precaution Comments: R sided inattention,Dense Right hemiparesis,  pusher toward Right, global aphasia deficits Restrictions Weight Bearing Restrictions: No   Vital  Signs: Therapy Vitals Temp: 97.9 F (36.6 C) Temp src: Oral Pulse Rate: 78 Resp: 18 BP: 138/84 mmHg Patient Position, if appropriate: Sitting Oxygen Therapy SpO2: 98 % O2 Device: None (Room air) Pain: Pt with no c/o pain this afternoon.   Locomotion : Wheelchair Mobility Distance: 150   See FIM for current functional status  Therapy/Group: Individual Therapy  Vista Deckarcell, Madden Piazza Ann 05/19/2013, 4:22 PM

## 2013-05-19 NOTE — Progress Notes (Signed)
Occupational Therapy Session Note  Patient Details  Name: [name] MRN: [id number] Date of Birth: [birth year]  Today's Date: 05/19/2013 Time: 1005-1102 Time Calculation (min): 57 min  Short Term Goals: Week 2:  OT Short Term Goal 1 (Week 2): Pt will sit statically EOB with close supervision for 5 mins in preparation for selfcare tasks. OT Short Term Goal 2 (Week 2): Pt will perform UB bathing with min assist and mod demonstrational cueing supported in wheelchair. OT Short Term Goal 3 (Week 2): Pt will perform LB bathing sit to stand with total assist pt 60 %. OT Short Term Goal 4 (Week 2): Pt will perform toilet transfer with max assist to the right side. OT Short Term Goal 5 (Week 2): Pt/family will return demonstrate safe performance of PROM/AAROM exercises for the RUE.  Skilled Therapeutic Interventions/Progress Updates:    Pt performed bathing and dressing sit to stand at the sink.  Pt needing max instructional cueing to participate in session as well as sequence through task.  At times he needs max demonstrational cueing with use of his right hand secondary to not understanding verbal cueing.  Total +2 pt 50% for sit to stand when performing peri washing as well as pulling brief and pants over hips.  Pt frequently during session becoming frustrated with his inability to communicate verbally.  Did acknowledge pain in the right wrist/hand during session as therapist also noticed him grimacing when attempting to use the LUE.   Pt still with severe pushing to the right side with frequent LOB during during unsupported sitting during bathing and dressing.    Therapy Documentation Precautions:  Precautions Precautions: Fall Precaution Comments: R sided inattention,Dense Right hemiparesis,  pusher toward Right, global aphasia deficits Restrictions Weight Bearing Restrictions: No   Pain: Pain Assessment Pain Assessment: Faces Pain Score: 0-No pain Faces Pain Scale: Hurts little  more Pain Type: Chronic pain Pain Location: Wrist Pain Orientation: Right Pain Intervention(s): Repositioned ADL: See FIM for current functional status  Therapy/Group: Individual Therapy  Laquia Rosano OTR/L 05/19/2013, 12:13 PM

## 2013-05-19 NOTE — Progress Notes (Signed)
Social Work Patient ID: [name] CapriceEdward A [name], male   DOB: July 14, 1950, 63 y.o.   MRN: [id number] Spoke with [name]- sister via telephone to inform of team conference progression toward goals.  Discussed needing to pursue placement and MD in agreement. Sister would like for him to stay here as long as possible.  Trying to get his dog here to visit him, since he is so close to him.  [name] to work on this and the shot records. Will begin NH process.

## 2013-05-19 NOTE — Progress Notes (Signed)
Physical Therapy Session Note  Patient Details  Name: [name] MRN: [id number] Date of Birth: Oct 14, 1950  Today's Date: 05/19/2013 Time: 1100-1158 Time Calculation (min): 58 min  Short Term Goals: Week 2:  PT Short Term Goal 1 (Week 2): Patient will be able to perform bed mobility with mod assist. PT Short Term Goal 2 (Week 2): Patient will be able to perform transfers with Max-assist of one person PT Short Term Goal 3 (Week 2): Patient will be able to perform static sitting balance x 3-5 mins with min assist. PT Short Term Goal 4 (Week 2): Pt will perform dynamic sitting balance at max assist level consistently.  PT Short Term Goal 5 (Week 2): Patient will be able to propel w/c 40' with Mod-assist.  Skilled Therapeutic Interventions/Progress Updates:   Pt received sitting in w/c at nursing station.  Assisted pt to gym via w/c at total assist level.  Performed squat pivot transfer at max assist (+2 for safety) utilizing bobath method for increased forward weight shift.  Pt beginning to initiate more movement on his own and attempting to advance feet during transfer.  Provided assist for forward weight shift and at R knee to prevent buckling.  Once seated at [facility], performed seated activity with horse shoes reaching to the R with LUE with therapist assisting to provide increased WB through RUE and reaching to the upper L to decrease pusher tendencies.  Pt initially resistant, however once he initiated he continued to attend to task and performed at appropriate speed without continued cues for attention, as he usually requires this.  Performed reaching activity at mod to max assist at times with cues for obtaining upright posture in between reaching trials.  Transitioned to performing in standing with +2 assist with PT at pts R axilla for increased support at shoulder, rib cage and trunk and also at R knee to prevent buckling.  Note some mild glute activation when reaching far to the L today during  session.  Pt continues to require max verbal and manual facilitation for upright posture and rehab tech at pts L to prevent LLE from moving.  Performed 2 reps of approx 4 mins of standing.  Ended time in gym with scooting to the R and L in order to facilitate forward weight shift and trunk shortening/lengthening, as well as increasing comfort going to the L.  Pt requires max assist to scoot with increased resistance initially when going to the L, however noted improvement as reps continued.  Note pt had small wet area on pants, therefore assisted pt back to chair as mentioned above and back to room to use restroom.  Assisted onto drop arm commode with same method mentioned above and stood again with total assist for rehab tech to adjust clothing.  Note pt incontinent of urine in brief, therefore discarded and applied new one.  Had pt wash peri are with wash cloth.  Then noted pt to have increased tremors in L side and went solemn for a few secs.  Provided rubbing to chest while calling pts name and after a few secs he then seems alert again.  RN notified.  Assisted pt back to w/c and donned quick release belt with half lap tray in place.  All needs in reach and family in room.    Therapy Documentation Precautions:  Precautions Precautions: Fall Precaution Comments: R sided inattention,Dense Right hemiparesis,  pusher toward Right, global aphasia deficits Restrictions Weight Bearing Restrictions: No   Pain: Pain Assessment Pain  Assessment: Faces Faces Pain Scale: Hurts little more Pain Type: Chronic pain Pain Location: Wrist Pain Orientation: Right Pain Intervention(s): Repositioned     See FIM for current functional status  Therapy/Group: Individual Therapy  Vista Deckarcell, Marrell Dicaprio Ann 05/19/2013, 12:43 PM

## 2013-05-19 NOTE — Progress Notes (Signed)
Subjective/Complaints: Visiting with family , discussed deficits as well as limited participation which is partially due to severity of deficits and partially behavioral Review of Systems - not able to obtain secondary to aphasia Objective: Vital Signs: Blood pressure 147/84, pulse 66, temperature 98.1 F (36.7 C), temperature source Oral, resp. rate 18, height 6\' 5"  (1.956 m), weight 84.278 kg (185 lb 12.8 oz), SpO2 97.00%. No results found. No results found for this or any previous visit (from the past 72 hour(s)).    Constitutional: He appears well-developed and well-nourished.  HENT:  Head: Normocephalic and atraumatic.  Eyes: Conjunctivae are normal. Pupils are equal, round, and reactive to light.  Neck: Normal range of motion. Neck supple.  Cardiovascular: Regular rhythm. Tachycardia present.  Respiratory: Effort normal and breath sounds normal. No respiratory distress. He has no wheezes.   GI: Soft. Bowel sounds are normal. He exhibits no distension. There is no tenderness.  Neurological: He is alert.   Flat affect with delayed processing. Restless and distracted. Minimal verbal output. Dense right hemiparesis with sensory deficits. Moves LUE/LLE with visual/verbal cues. Unable to follow simple commands. RUE and RLE are 0/5. No gross pain sense in the right fingers or toes. Able to only state name, moderate dysarthria Skin: Skin is warm and dry     Assessment/Plan: 1. 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 Physiatrist is providing close team supervision and 24 hour management of active medical problems listed below. Physiatrist and rehab team continue to assess barriers to discharge/monitor patient progress toward functional and medical goals.  FIM: FIM - Bathing Bathing Steps Patient Completed: Chest;Right  Arm;Abdomen;Right upper leg;Left upper leg Bathing: 1: Two helpers  FIM - Upper Body Dressing/Undressing Upper body dressing/undressing steps patient completed: Thread/unthread left sleeve of pullover shirt/dress Upper body dressing/undressing: 2: Max-Patient completed 25-49% of tasks FIM - Lower Body Dressing/Undressing Lower body dressing/undressing steps patient completed: Thread/unthread left pants leg Lower body dressing/undressing: 1: Two helpers  FIM - Toileting Toileting: 0: Activity did not occur  FIM - Archivist Transfers: 1-Two helpers  FIM - Banker Devices: Arm rests Bed/Chair Transfer: 1: Two helpers (w/c to/from mat )  FIM - Locomotion: Wheelchair Locomotion: Wheelchair: 0: Activity did not occur FIM - Locomotion: Ambulation Locomotion: Ambulation Assistive Devices: MaxiSky Ambulation/Gait Assistance: 1: +2 Total assist Locomotion: Ambulation: 1: Two helpers  Comprehension Comprehension Mode: Auditory Comprehension: 2-Understands basic 25 - 49% of the time/requires cueing 51 - 75% of the time  Expression Expression Mode: Verbal (global aphasia) Expression: 2-Expresses basic 25 - 49% of the time/requires cueing 50 - 75% of the time. Uses single words/gestures.  Social Interaction Social Interaction: 2-Interacts appropriately 25 - 49% of time - Needs frequent redirection.  Problem Solving Problem Solving: 1-Solves basic less than 25% of the time - needs direction nearly all the time or does not effectively solve problems and may need a restraint for safety  Memory Memory: 1-Recognizes or recalls less than 25% of the time/requires cueing greater than 75% of the time  Medical Problem List and Plan:  1. 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  Doppler no carotid stenosis 2. DVT Prophylaxis/Anticoagulation: Subcutaneous Lovenox initiated 05/05/2013. Monitor platelet  counts any signs of bleeding  3. Pain Management: Tylenol as needed  4. Neuropsych: This patient is not capable of making decisions on his own  behalf.  5. Dysphagia. Dysphagia 1 honey thick liquids. Monitor closely for any aspiration. Followup speech therapy  6. Hypertension.Uncontrolled with tachycardia Norvasc 10 mg daily, hydrochlorothiazide 25 mg daily. Monitor with increased mobility, titrate BB 7. OSA. CPAP. Will follow pulmonary services as needed  8.  R knee effusion mild OA, elevated urate,Gout resolved after celebrex       LOS (Days) 13 A FACE TO FACE EVALUATION WAS PERFORMED  KIRSTEINS,ANDREW E 05/19/2013, 12:29 PM

## 2013-05-19 NOTE — Progress Notes (Signed)
Orthopedic Tech Progress Note Patient Details:  Linna Capricedward A Ungerer 1950-08-27 [id number] Brace order completed by [facility] Patient ID: Linna CapriceEdward A Pendell, male   DOB: 1950-08-27, 63 y.o.   MRN: [id number]   Jennye MoccasinHughes, Fransico Sciandra Craig 05/19/2013, 9:11 PM

## 2013-05-19 NOTE — Patient Care Conference (Signed)
Inpatient RehabilitationTeam Conference and Plan of Care Update Date: 05/18/2013   Time: 10;55 Am    Patient Name: [name]      Medical Record Number: [id number]  Date of Birth: 07-01-1950 Sex: Male         Room/Bed: 4W09C/4W09C-01 Payor Info: Payor: MEDICARE / Plan: MEDICARE PART A AND B / Product Type: *No Product type* /    Admitting Diagnosis: LT ICH  Admit Date/Time:  05/06/2013  6:42 PM Admission Comments: No comment available   Primary Diagnosis:  <principal problem not specified> Principal Problem: <principal problem not specified>  Patient Active Problem List   Diagnosis Date Noted  . probable undiagnosed OSA on CPAP 05/06/2013  . Other and unspecified hyperlipidemia 05/06/2013  . Hypokalemia 05/06/2013  . Chews tobacco 05/06/2013  . Dysphagia, secondary to stroke 05/06/2013  . Malignant hypertension 05/02/2013  . Acute respiratory distress 05/02/2013  . Tobacco abuse 05/02/2013  . 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 Claudette Laws Social Worker Present: Dossie Der, LCSW Nurse Present: Kennon Portela, RN PT Present: Edman Circle, PT;Emily Parcell, PT OT Present: Rosalio Loud, Loistine Chance, OT SLP Present: Fae Pippin, SLP PPS Coordinator present : Tora Duck, RN, CRRN     Current Status/Progress Goal Weekly Team Focus  Medical   upgraded diet, knee pain improved  reduce frustration with deficits  determine whether family will manage   Bowel/Bladder   incontinent of bowel and bladder- condom cath HS  Continent of bowel/bladder with max assist-   timed tolieting  q2 hrs    Swallow/Nutrition/ Hydration   Dys.2 textures and nectar-thick liquids with full supervision   least restrictive PO intake  diet toleration, use of compensatory strategies   ADL's   Pt is currently mod assist for UB bathing and dressing, still total  assist for squat pivot transfers in and out of bed.  Total +2 for sit to stand and static standing balance,  Still with increased pushing to the right side in sitting and standing.  Brunnstrum stage I to II in the right UE  Set at overall min assist level  selfcare retraining, sitting and standing balance, pt/family education,    Mobility   Pt currently requires max assist for bed mobility, total assist to +2 assist for transfers due to pushing and resistance.  +2 assist for standing and gait (performed with maxi sky).  Note participation has gotten slightly better, however pt continues to demonstrate increased frustration with communication deficits.    min assist overall w/c level  functional transfers, NMR for RLE/UE, dynamic sitting balance/trunk control, addressing pusher postures, standing balance, pre-gait   Communication   Max with multi-modal  Min A with multimodal communication  increase participation, self-monitoring and correcting   Safety/Cognition/ Behavioral Observations  Max assist   Min A  increase attention and awareness with communication focus   Pain   pt shows grimicing when staff moves right knee.   <3 on a scale of 0-10  assess for pain and medicate as needed   Skin   sacrum reddened- blanchable   Skin to remain free from breakdown  assess skin qshift       *See Care Plan and progress notes for long and short-term goals.  Barriers to Discharge: see above    Possible Resolutions to Barriers:  SNF    Discharge Planning/Teaching Needs:    NHP does not have  24 hr care     Team Discussion:  Pt making slow progress-participating more-does more when sister of girlfriend here.  Diet advanced to dys 2 nectar liquids.  Started antidepressant.  Knee pain off celebrex-MD addressing  Revisions to Treatment Plan:  NHP   Continued Need for Acute Rehabilitation Level of Care: The patient requires daily medical management by a physician with specialized training in physical  medicine and rehabilitation for the following conditions: Daily direction of a multidisciplinary physical rehabilitation program to ensure safe treatment while eliciting the highest outcome that is of practical value to the patient.: Yes Daily medical management of patient stability for increased activity during participation in an intensive rehabilitation regime.: Yes Daily analysis of laboratory values and/or radiology reports with any subsequent need for medication adjustment of medical intervention for : Neurological problems;Other  Lucy ChrisDupree, Brinlyn Cena G 05/19/2013, 8:46 AM

## 2013-05-19 NOTE — Progress Notes (Signed)
Orthopedic Tech Progress Note Patient Details:  [name] [birth year] [id number] Called [facility] for brace order. Patient ID: [name], male   DOB: [birth year], 63 y.o.   MRN: [id number]   Jennye MoccasinHughes, Phil Corti Craig 05/19/2013, 4:15 PM

## 2013-05-19 NOTE — Progress Notes (Signed)
Speech Language Pathology Daily Session Note  Patient Details  Name: [name] MRN: [id number] Date of Birth: [birth year]  Today's Date: 05/19/2013 Time: [id number] Time Calculation (min): 42 min  Short Term Goals: Week 2: SLP Short Term Goal 1 (Week 2): Pt will utilize safe swallowing strategies with Dys 2 textures and nectar-thick liquids with Mod cues  SLP Short Term Goal 2 (Week 2): Pt will consume trials of thin liquids with small sips and minimal overt s/s of aspiration with Max cues SLP Short Term Goal 3 (Week 2): Pt will sustain attention to basic functional task for 5 minutes with Max cues SLP Short Term Goal 4 (Week 2): Pt will communicate basic wants/needs via multimodal communication with Mod cues SLP Short Term Goal 5 (Week 2): Pt will follow one-step commands with Max cues SLP Short Term Goal 6 (Week 2): Pt will receptively identify common objects from a field of four with Supervision  cues  Skilled Therapeutic Interventions: Skilled treatment session focused on address dysphagia and cognitive-linguistic goals.  Patient required 3+ assist to transfer from bed to wheelchair.  SLP facilitated session with set-up of breakfast tray on table with mirror preset to facilitate increased awareness of right sided anterior loss during p.o. intake.  Patient refused SLP set-up assist with verbal and non-verbal means; patient poured applesauce into cup and attempted to drink SLP attempt to assisted by pouring juice into cup and handing it to patient; however it as not successful at re-directing patient.  Patient with no awareness of difficulty of task and eventually got applesauce into mouth and significant right anterior loss.  Despite Max verbal, visual and demonstrative cues patient required Total assist to manage.  SLP also facilitated session with a structured naming task.  Patient accurately named 3/10 ADL objects; with Max assist verbal and written cues patient approximated names in 9/10  opportunities and refused to attempt final object.  Recommend to continue with current plan of care.    FIM:  Comprehension Comprehension Mode: Auditory Comprehension: 2-Understands basic 25 - 49% of the time/requires cueing 51 - 75% of the time Expression Expression Mode: Verbal Expression: 2-Expresses basic 25 - 49% of the time/requires cueing 50 - 75% of the time. Uses single words/gestures. Social Interaction Social Interaction: 3-Interacts appropriately 50 - 74% of the time - May be physically or verbally inappropriate. Problem Solving Problem Solving: 1-Solves basic less than 25% of the time - needs direction nearly all the time or does not effectively solve problems and may need a restraint for safety Memory Memory: 1-Recognizes or recalls less than 25% of the time/requires cueing greater than 75% of the time FIM - Eating Eating Activity: 4: Help with managing cup/glass  Pain Pain Assessment Pain Assessment: No/denies pain  Therapy/Group: Individual Therapy  [name], M.A., CCC-SLP [phone]  [name] 05/19/2013, 11:15 AM

## 2013-05-20 ENCOUNTER — Inpatient Hospital Stay (HOSPITAL_COMMUNITY): Payer: Medicare Other | Admitting: Speech Pathology

## 2013-05-20 ENCOUNTER — Inpatient Hospital Stay (HOSPITAL_COMMUNITY): Payer: Medicare Other | Admitting: Occupational Therapy

## 2013-05-20 ENCOUNTER — Encounter (HOSPITAL_COMMUNITY): Payer: Medicare Other | Admitting: Occupational Therapy

## 2013-05-20 ENCOUNTER — Inpatient Hospital Stay (HOSPITAL_COMMUNITY): Payer: Medicare Other | Admitting: Rehabilitation

## 2013-05-20 DIAGNOSIS — G8110 Spastic hemiplegia affecting unspecified side: Secondary | ICD-10-CM

## 2013-05-20 DIAGNOSIS — I619 Nontraumatic intracerebral hemorrhage, unspecified: Secondary | ICD-10-CM

## 2013-05-20 DIAGNOSIS — I69920 Aphasia following unspecified cerebrovascular disease: Secondary | ICD-10-CM

## 2013-05-20 DIAGNOSIS — M109 Gout, unspecified: Secondary | ICD-10-CM

## 2013-05-20 LAB — CREATININE, SERUM
Creatinine, Ser: 1.06 mg/dL (ref 0.50–1.35)
GFR calc Af Amer: 85 mL/min — ABNORMAL LOW (ref >90)
GFR calc non Af Amer: 73 mL/min — ABNORMAL LOW (ref >90)

## 2013-05-20 MED ORDER — SENNOSIDES-DOCUSATE SODIUM 8.6-50 MG PO TABS
1.0000 | ORAL_TABLET | Freq: Two times a day (BID) | ORAL | Status: DC
  Administered 2013-05-20 – 2013-06-06 (×34): 1 via ORAL
  Filled 2013-05-20 (×20): qty 1
  Filled 2013-05-20: qty 11
  Filled 2013-05-20 (×12): qty 1

## 2013-05-20 NOTE — Progress Notes (Signed)
Occupational Therapy Session Note  Patient Details  Name: [name] MRN: [id number] Date of Birth: 11/03/1950  Today's Date: 05/20/2013 Time: 1003-1100 Time Calculation (min): 57 min  Short Term Goals: Week 2:  OT Short Term Goal 1 (Week 2): Pt will sit statically EOB with close supervision for 5 mins in preparation for selfcare tasks. OT Short Term Goal 2 (Week 2): Pt will perform UB bathing with min assist and mod demonstrational cueing supported in wheelchair. OT Short Term Goal 3 (Week 2): Pt will perform LB bathing sit to stand with total assist pt 60 %. OT Short Term Goal 4 (Week 2): Pt will perform toilet transfer with max assist to the right side. OT Short Term Goal 5 (Week 2): Pt/family will return demonstrate safe performance of PROM/AAROM exercises for the RUE.  Skilled Therapeutic Interventions/Progress Updates:    Pt performed bathing and dressing sit to stand at the sink. Pt needing max instructional cueing to participate in session as well as sequence through task. At times he needs max demonstrational cueing with use of his right hand secondary to not understanding verbal cueing. Total +2 pt 50% for sit to stand when performing peri washing as well as pulling brief and pants over hips. He continues to slide his LLE out to the side and stand in trunk and knee flexion.  Pt frequently during session becoming frustrated with his inability to communicate verbally. Did acknowledge pain in the right wrist/hand during session as therapist also noticed him grimacing when attempting to use the LUE for applying deodorant and washing the RUE with total assist. Pt still with severe pushing to the right side with frequent LOB during during unsupported sitting during bathing and dressing.    Therapy Documentation Precautions:  Precautions Precautions: Fall Precaution Comments: R sided inattention,Dense Right hemiparesis,  pusher toward Right, global aphasia deficits Restrictions Weight  Bearing Restrictions: No  Pain: Pain Assessment Pain Assessment: Faces Faces Pain Scale: Hurts a little bit Pain Type: Chronic pain Pain Location: Wrist Pain Orientation: Right Pain Intervention(s): Repositioned ADL: See FIM for current functional status  Therapy/Group: Individual Therapy  Rahcel Shutes OTR/L 05/20/2013, 12:30 PM

## 2013-05-20 NOTE — Progress Notes (Signed)
Subjective/Complaints: "can move Left" Review of Systems - not able to obtain secondary to aphasia Objective: Vital Signs: Blood pressure 136/78, pulse 71, temperature 98.1 F (36.7 C), temperature source Axillary, resp. rate 18, height $RemoveBe'6\' 5"'YqmSXYjTG$  (1.956 m), weight 84.278 kg (185 lb 12.8 oz), SpO2 93.00%. No results found. Results for orders placed during the hospital encounter of 05/06/13 (from the past 72 hour(s))  CREATININE, SERUM     Status: Abnormal   Collection Time    05/20/13  5:32 AM      Result Value Range   Creatinine, Ser 1.06  0.50 - 1.35 mg/dL   GFR calc non Af Amer 73 (*) >90 mL/min   GFR calc Af Amer 85 (*) >90 mL/min   Comment: (NOTE)     The eGFR has been calculated using the CKD EPI equation.     This calculation has not been validated in all clinical situations.     eGFR's persistently <90 mL/min signify possible Chronic Kidney     Disease.      Constitutional: He appears well-developed and well-nourished.  HENT:  Head: Normocephalic and atraumatic.  Eyes: Conjunctivae are normal. Pupils are equal, round, and reactive to light.  Neck: Normal range of motion. Neck supple.  Cardiovascular: Regular rhythm. Tachycardia present.  Respiratory: Effort normal and breath sounds normal. No respiratory distress. He has no wheezes.   GI: Soft. Bowel sounds are normal. He exhibits no distension. There is no tenderness.  Neurological: He is alert.   Flat affect with delayed processing. Restless and distracted. Minimal verbal output. Dense right hemiparesis with sensory deficits. Moves LUE/LLE with visual/verbal cues. Unable to follow simple commands. RUE and RLE are 0/5. Marland Kitchen Able to only state name, moderate dysarthria Skin: Skin is warm and dry     Assessment/Plan: 1. 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 Physiatrist  is providing close team supervision and 24 hour management of active medical problems listed below. Physiatrist and rehab team continue to assess barriers to discharge/monitor patient progress toward functional and medical goals.  FIM: FIM - Bathing Bathing Steps Patient Completed: Chest;Right Arm;Abdomen;Right upper leg;Left upper leg Bathing: 1: Two helpers  FIM - Upper Body Dressing/Undressing Upper body dressing/undressing steps patient completed: Thread/unthread left sleeve of pullover shirt/dress Upper body dressing/undressing: 2: Max-Patient completed 25-49% of tasks FIM - Lower Body Dressing/Undressing Lower body dressing/undressing steps patient completed: Thread/unthread left pants leg Lower body dressing/undressing: 1: Two helpers  FIM - Toileting Toileting: 0: Activity did not occur  FIM - Air cabin crew Transfers: 1-Two helpers  FIM - Control and instrumentation engineer Devices: Bed rails Bed/Chair Transfer: 2: Supine > Sit: Max A (lifting assist/Pt. 25-49%);2: Bed > Chair or W/C: Max A (lift and lower assist)  FIM - Locomotion: Wheelchair Distance: 150 Locomotion: Wheelchair: 2: Travels 150 ft or more: maneuvers on rugs and over door sills with maximal assistance (Pt: 25 - 49%) FIM - Locomotion: Ambulation Locomotion: Ambulation Assistive Devices: MaxiSky Ambulation/Gait Assistance: 1: +2 Total assist Locomotion: Ambulation: 0: Activity did not occur  Comprehension Comprehension Mode: Auditory Comprehension: 2-Understands basic 25 - 49% of the time/requires cueing 51 - 75% of the time  Expression Expression Mode: Verbal (global aphasia) Expression: 2-Expresses basic 25 - 49% of the time/requires cueing 50 - 75% of the time. Uses single words/gestures.  Social Interaction Social Interaction: 2-Interacts appropriately 25 - 49% of time -  Needs frequent redirection.  Problem Solving Problem Solving: 1-Solves basic less than 25% of the time -  needs direction nearly all the time or does not effectively solve problems and may need a restraint for safety  Memory Memory: 1-Recognizes or recalls less than 25% of the time/requires cueing greater than 75% of the time  Medical Problem List and Plan:  1. 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  Doppler no carotid stenosis 2. DVT Prophylaxis/Anticoagulation: Subcutaneous Lovenox initiated 05/05/2013. Monitor platelet counts any signs of bleeding  3. Pain Management: Tylenol as needed  4. Neuropsych: This patient is not capable of making decisions on his own behalf.  5. Dysphagia. Dysphagia 1 honey thick liquids. Monitor closely for any aspiration. Followup speech therapy  6. Hypertension.Uncontrolled with tachycardia Norvasc 10 mg daily, hydrochlorothiazide 25 mg daily. Monitor with increased mobility, titrate BB 7. OSA. CPAP. Will follow pulmonary services as needed  8.  R knee effusion mild OA, elevated urate,Gout resolved after celebrex       LOS (Days) 14 A FACE TO FACE EVALUATION WAS PERFORMED  Amaranta Mehl E 05/20/2013, 7:57 AM

## 2013-05-20 NOTE — Progress Notes (Signed)
Speech Language Pathology Daily Session Note  Patient Details  Name: [name] MRN: [id number] Date of Birth: [birth year]  Today's Date: 05/20/2013 Time: 9528-41320833-0915 Time Calculation (min): 42 min  Short Term Goals: Week 2: SLP Short Term Goal 1 (Week 2): Pt will utilize safe swallowing strategies with Dys 2 textures and nectar-thick liquids with Mod cues  SLP Short Term Goal 2 (Week 2): Pt will consume trials of thin liquids with small sips and minimal overt s/s of aspiration with Max cues SLP Short Term Goal 3 (Week 2): Pt will sustain attention to basic functional task for 5 minutes with Max cues SLP Short Term Goal 4 (Week 2): Pt will communicate basic wants/needs via multimodal communication with Mod cues SLP Short Term Goal 5 (Week 2): Pt will follow one-step commands with Max cues SLP Short Term Goal 6 (Week 2): Pt will receptively identify common objects from a field of four with Supervision  cues  Skilled Therapeutic Interventions: Skilled treatment session focused on address dysphagia and cognitive-linguistic goals.  SLP facilitated session with set-up of oral care via suctioning; SLP attempted verbal, visual and tactile cues for thoroughness however, patient refused to allow SLP to assist.  SLP also facilitated session with set-up of breakfast tray despite patient stating "no." Patient took cup initiated sips, but required Max tactile cues to consume small sips, which were effective at preventing right anterior loss as well as overt s/s of aspiration.  Patient verbally and gesturally refused p.o.  Patient verbalized spontaneous 4-5 word phrases in today's session however, were not contextually appropriate.   During a structured naming task patient accurately named 3/5 ADL objects; with Max assist verbal and written cues; however, patient frequently refused on open his eyes today.  Recommend to continue with current plan of care.   FIM:  Comprehension Comprehension Mode:  Auditory Comprehension: 2-Understands basic 25 - 49% of the time/requires cueing 51 - 75% of the time Expression Expression Mode: Verbal (global aphasia) Expression: 2-Expresses basic 25 - 49% of the time/requires cueing 50 - 75% of the time. Uses single words/gestures. Social Interaction Social Interaction: 2-Interacts appropriately 25 - 49% of time - Needs frequent redirection. Problem Solving Problem Solving: 1-Solves basic less than 25% of the time - needs direction nearly all the time or does not effectively solve problems and may need a restraint for safety Memory Memory: 1-Recognizes or recalls less than 25% of the time/requires cueing greater than 75% of the time FIM - Eating Eating Activity: 1: Helper feeds patient;4: Help with managing cup/glass;5: Needs verbal cues/supervision;4: Helper checks for pocketed food  Pain Pain Assessment Pain Assessment: No/denies pain Faces Pain Scale: Hurts a little bit Pain Type: Chronic pain Pain Location: Wrist Pain Orientation: Right Pain Intervention(s): Repositioned  Therapy/Group: Individual Therapy  Charlane FerrettiMelissa Declynn Lopresti, M.A., CCC-SLP [phone]  [name] 05/20/2013, 1:34 PM

## 2013-05-20 NOTE — Progress Notes (Signed)
Physical Therapy Session Note  Patient Details  Name: [name] MRN: [id number] Date of Birth: Jun 23, 1950  Today's Date: 05/20/2013 Time: 1100-1150 Time Calculation (min): 50 min  Short Term Goals: Week 2:  PT Short Term Goal 1 (Week 2): Patient will be able to perform bed mobility with mod assist. PT Short Term Goal 2 (Week 2): Patient will be able to perform transfers with Max-assist of one person PT Short Term Goal 3 (Week 2): Patient will be able to perform static sitting balance x 3-5 mins with min assist. PT Short Term Goal 4 (Week 2): Pt will perform dynamic sitting balance at max assist level consistently.  PT Short Term Goal 5 (Week 2): Patient will be able to propel w/c 6550' with Mod-assist.  Skilled Therapeutic Interventions/Progress Updates:   Pt received sitting in w/c with rehab tech assisting pt to gym for PT session.  Transferred pt chair <> w/c at max assist via bobath method for increased forward weight shift.  Pt doing much better initiating movement of LEs during transfers and note pushing is somewhat better.  Focus of session was SL activity focusing on trunk rotation forwards/backwards to carryover to improved bed mobility and transfers.  Pt became somewhat irritated, however was able to engage pt in reaching activities in sitting to decrease pusher tendencies and also to facilitate trunk shortening on the R.  Pt continued to become agitated during session, however continued dynamic sitting balance with hitting ball back to rehab tech.  Note pt did much better with righting reactions and posture during this activity and could require min/guard to supervision at times.  Pt transferred back to w/c as mentioned above and left at nursing station for increased safety/supervision with half lap tray and quick release belt donned.   Therapy Documentation Precautions:  Precautions Precautions: Fall Precaution Comments: R sided inattention,Dense Right hemiparesis,  pusher toward  Right, global aphasia deficits Restrictions Weight Bearing Restrictions: No General: Amount of Missed PT Time (min): 10 Minutes Missed Time Reason: Patient unwilling/refused to participate without medical reason Vital Signs: Therapy Vitals Temp: 98.2 F (36.8 C) Temp src: Oral Pulse Rate: 78 Resp: 17 BP: 121/70 mmHg Patient Position, if appropriate: Lying Oxygen Therapy SpO2: 97 % O2 Device: None (Room air) Pain: Pain Assessment Pain Assessment: No/denies pain     See FIM for current functional status  Therapy/Group: Individual Therapy  Vista Deckarcell, Philander Ake Ann 05/20/2013, 5:04 PM

## 2013-05-20 NOTE — Progress Notes (Signed)
Occupational Therapy Session Note  Patient Details  Name: [name] MRN: [id number] Date of Birth: 25-May-1950  Today's Date: 05/20/2013 Time: [id number] Time Calculation (min): 30 min  Skilled Therapeutic Interventions/Progress Updates:    Pt transferred to the EOB with max assist to work on sitting balance while performing grooming activity of shaving.  Positioned pt's electric razor to the left side of him and had him shift to his left side in sitting to be able to reach the razor.  He needed max demonstrational cueing to finally pick up the razor.  Therapist turned on the razor and handed it to him with min instructional cueing to attempt shaving.  Pt placed razor up to his face and began to shave, but only for a few seconds.  Then he turned it off.  Therapist again cued him that he had missed a few spots and to keep trying.  Pt needed mod assist to turn the razor back on and he continued.  While performing shaving he needed max assist for sitting.  Therapist provided mod assist with shaving task while pt worked on sitting EOB.  He was able to maintain static sitting with mod assist while therapist assisted with shaving, however he needed max instructional cueing to keep his eyes open as he continued to demonstrate posterior lean with eyes closed.  Positioned back in supine position with pillows in place under the RUE and RLE.    Therapy Documentation Precautions:  Precautions Precautions: Fall Precaution Comments: R sided inattention,Dense Right hemiparesis,  pusher toward Right, global aphasia deficits Restrictions Weight Bearing Restrictions: No  Pain: Pain Assessment Pain Assessment: No/denies pain ADL: See FIM for current functional status  Therapy/Group: Individual Therapy  Oscar Forman OTR/L 05/20/2013, 3:32 PM

## 2013-05-20 NOTE — Progress Notes (Signed)
Pt is refusing to wear CPAP tonight. According to prior documentation Pt seems very confused about therapy. Pt is in no distress. Rt will continue to monitor. CPAP is at bedside

## 2013-05-21 ENCOUNTER — Inpatient Hospital Stay (HOSPITAL_COMMUNITY): Payer: Medicare Other | Admitting: Speech Pathology

## 2013-05-21 NOTE — Progress Notes (Signed)
Subjective/Complaints: "meet tomorrow" Review of Systems - not able to obtain secondary to aphasia Objective: Vital Signs: Blood pressure 145/74, pulse 76, temperature 98.5 F (36.9 C), temperature source Oral, resp. rate 19, height $RemoveBe'6\' 5"'KfsQsmpmA$  (1.956 m), weight 84.278 kg (185 lb 12.8 oz), SpO2 95.00%. No results found. Results for orders placed during the hospital encounter of 05/06/13 (from the past 72 hour(s))  CREATININE, SERUM     Status: Abnormal   Collection Time    05/20/13  5:32 AM      Result Value Range   Creatinine, Ser 1.06  0.50 - 1.35 mg/dL   GFR calc non Af Amer 73 (*) >90 mL/min   GFR calc Af Amer 85 (*) >90 mL/min   Comment: (NOTE)     The eGFR has been calculated using the CKD EPI equation.     This calculation has not been validated in all clinical situations.     eGFR's persistently <90 mL/min signify possible Chronic Kidney     Disease.      Constitutional: He appears well-developed and well-nourished.  HENT:  Head: Normocephalic and atraumatic.  Eyes: Conjunctivae are normal. Pupils are equal, round, and reactive to light.  Neck: Normal range of motion. Neck supple.  Cardiovascular: Regular rhythm. Tachycardia present.  Respiratory: Effort normal and breath sounds normal. No respiratory distress. He has no wheezes.   GI: Soft. Bowel sounds are normal. He exhibits no distension. There is no tenderness.  Neurological: He is alert.   Flat affect with delayed processing. Restless and distracted. Minimal verbal output. Dense right hemiparesis with sensory deficits. Moves LUE/LLE with visual/verbal cues. Unable to follow simple commands. RUE and RLE are 0/5. Marland Kitchen Able to only state name, moderate dysarthria Skin: Skin is warm and dry     Assessment/Plan: 1. 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 Physiatrist is  providing close team supervision and 24 hour management of active medical problems listed below. Physiatrist and rehab team continue to assess barriers to discharge/monitor patient progress toward functional and medical goals.  FIM: FIM - Bathing Bathing Steps Patient Completed: Chest;Right Arm;Abdomen;Right upper leg;Left upper leg Bathing: 1: Two helpers  FIM - Upper Body Dressing/Undressing Upper body dressing/undressing steps patient completed: Thread/unthread left sleeve of pullover shirt/dress Upper body dressing/undressing: 2: Max-Patient completed 25-49% of tasks FIM - Lower Body Dressing/Undressing Lower body dressing/undressing steps patient completed: Thread/unthread left pants leg Lower body dressing/undressing: 1: Two helpers  FIM - Toileting Toileting: 0: Activity did not occur  FIM - Air cabin crew Transfers: 1-Two helpers  FIM - Control and instrumentation engineer Devices: Arm rests Bed/Chair Transfer: 2: Bed > Chair or W/C: Max A (lift and lower assist);2: Chair or W/C > Bed: Max A (lift and lower assist)  FIM - Locomotion: Wheelchair Distance: 150 Locomotion: Wheelchair: 0: Activity did not occur FIM - Locomotion: Ambulation Locomotion: Ambulation Assistive Devices: MaxiSky Ambulation/Gait Assistance: 1: +2 Total assist Locomotion: Ambulation: 0: Activity did not occur  Comprehension Comprehension Mode: Auditory Comprehension: 2-Understands basic 25 - 49% of the time/requires cueing 51 - 75% of the time  Expression Expression Mode: Verbal Expression: 2-Expresses basic 25 - 49% of the time/requires cueing 50 - 75% of the time. Uses single words/gestures.  Social Interaction Social Interaction: 2-Interacts appropriately 25 - 49% of time - Needs frequent redirection.  Problem Solving Problem Solving: 1-Solves basic less than 25% of the  time - needs direction nearly all the time or does not effectively solve problems and may need a  restraint for safety  Memory Memory: 1-Recognizes or recalls less than 25% of the time/requires cueing greater than 75% of the time  Medical Problem List and Plan:  1. 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  Doppler no carotid stenosis 2. DVT Prophylaxis/Anticoagulation: Subcutaneous Lovenox initiated 05/05/2013. Monitor platelet counts any signs of bleeding  3. Pain Management: Tylenol as needed  4. Neuropsych: This patient is not capable of making decisions on his own behalf.  5. Dysphagia. Dysphagia 1 honey thick liquids. Monitor closely for any aspiration. Followup speech therapy  6. Hypertension.Uncontrolled with tachycardia Norvasc 10 mg daily, hydrochlorothiazide 25 mg daily. Monitor with increased mobility, titrate BB 7. OSA. CPAP. Will follow pulmonary services as needed  8.  R knee effusion mild OA, elevated urate,Gout resolved after celebrex       LOS (Days) 15 A FACE TO FACE EVALUATION WAS PERFORMED  KIRSTEINS,ANDREW E 05/21/2013, 9:22 AM

## 2013-05-21 NOTE — Progress Notes (Signed)
Spoke with pt about wearing CPAP. Pt said he didn't want it and for us to take it back. CPAP removed from pt's room at this time. If pt changes mind and wants to wear, RN to notify us. RT will continue to monitor.

## 2013-05-21 NOTE — Progress Notes (Signed)
Speech Language Pathology Daily Session Note  Patient Details  Name: [name] MRN: [id number] Date of Birth: 04-22-1951  Today's Date: 05/21/2013 Time: 1430-1500 Time Calculation (min): 30 min  Short Term Goals: Week 2: SLP Short Term Goal 1 (Week 2): Pt will utilize safe swallowing strategies with Dys 2 textures and nectar-thick liquids with Mod cues  SLP Short Term Goal 2 (Week 2): Pt will consume trials of thin liquids with small sips and minimal overt s/s of aspiration with Max cues SLP Short Term Goal 3 (Week 2): Pt will sustain attention to basic functional task for 5 minutes with Max cues SLP Short Term Goal 4 (Week 2): Pt will communicate basic wants/needs via multimodal communication with Mod cues SLP Short Term Goal 5 (Week 2): Pt will follow one-step commands with Max cues SLP Short Term Goal 6 (Week 2): Pt will receptively identify common objects from a field of four with Supervision  cues  Skilled Therapeutic Interventions: Therapeutic intervention complete with short term goals addressed.  Patient was attempting to get out of bed, upon entering room.  Minimally combative with repositioning in bed.  Required max verbal cues to keep eyes open and then immediately close them again.  He did consume DII textures without s/s of aspiration; however, he did require min verbal cues to masticate.  He was able to identify functional items with 50% accuracy.  Continue with current treatment plan.    FIM:  Comprehension Comprehension Mode: Auditory Comprehension: 2-Understands basic 25 - 49% of the time/requires cueing 51 - 75% of the time Expression Expression Mode: Verbal Expression: 1-Expresses basis less than 25% of the time/requires cueing greater than 75% of the time. Social Interaction Social Interaction: 1-Interacts appropriately less than 25% of the time. May be withdrawn or combative. Problem Solving Problem Solving: 1-Solves basic less than 25% of the time - needs  direction nearly all the time or does not effectively solve problems and may need a restraint for safety Memory Memory: 1-Recognizes or recalls less than 25% of the time/requires cueing greater than 75% of the time FIM - Eating Eating Activity: 3: Helper brings food to mouth every scoop  Pain Pain Assessment Pain Assessment: No/denies pain  Therapy/Group: Individual Therapy  Lenny PastelSturgill, Rolfe Hartsell Leigh 05/21/2013, 4:09 PM

## 2013-05-21 NOTE — Progress Notes (Signed)
RN and NT attempted to assist pt with bathing and dressing. Pt resistant to our efforts, physically pushing RN away. Managed to provide oral care and change shirt, but declined bathing or getting up to chair at this time. Provided support and encouragement to pt. Continue with plan of care. Mick SellShannon Lamekia Nolden RN.

## 2013-05-21 NOTE — Plan of Care (Signed)
Problem: RH BOWEL ELIMINATION Goal: RH STG MANAGE BOWEL WITH ASSISTANCE STG Manage Bowel with mod Assistance.  Outcome: Not Progressing LBM 1/12 Goal: RH STG MANAGE BOWEL W/MEDICATION W/ASSISTANCE STG Manage Bowel with Medication with mod Assistance.  Outcome: Progressing Scheduled Senna 1 tab BID

## 2013-05-22 ENCOUNTER — Ambulatory Visit (HOSPITAL_COMMUNITY): Payer: Medicare Other | Admitting: *Deleted

## 2013-05-22 MED ORDER — WHITE PETROLATUM GEL
Status: AC
  Administered 2013-05-22: 0.2
  Filled 2013-05-22: qty 5

## 2013-05-22 MED ORDER — MIRTAZAPINE 30 MG PO TBDP
30.0000 mg | ORAL_TABLET | Freq: Every day | ORAL | Status: DC
  Administered 2013-05-22 – 2013-06-05 (×15): 30 mg via ORAL
  Filled 2013-05-22 (×19): qty 1

## 2013-05-22 NOTE — Progress Notes (Signed)
Pt agreeable to allow RN and NT bathe and dress him. Initially agreed to get OOB, but experienced pain in RLE and changed his mind. Pt's R foot appears sensitive to touch. When washing foot or putting sock on, pt jumps, grimaces, and yells out in pain. Gave vicodin 1 tab at 1445, will assess how effective it is later. Pt making attempts to verbalize needs with several 3-4 word phrases able to be understood by RN. Continue with plan of care. Mick SellShannon Minette Manders RN

## 2013-05-22 NOTE — Progress Notes (Signed)
Subjective/Complaints: "couple nurses"  occ garbled speech, will respond occ to Y/N question Review of Systems - not able to obtain secondary to aphasia Objective: Vital Signs: Blood pressure 149/77, pulse 72, temperature 98 F (36.7 C), temperature source Oral, resp. rate 18, height $RemoveBe'6\' 5"'GBouffhkD$  (1.956 m), weight 84.278 kg (185 lb 12.8 oz), SpO2 97.00%. No results found. Results for orders placed during the hospital encounter of 05/06/13 (from the past 72 hour(s))  CREATININE, SERUM     Status: Abnormal   Collection Time    05/20/13  5:32 AM      Result Value Range   Creatinine, Ser 1.06  0.50 - 1.35 mg/dL   GFR calc non Af Amer 73 (*) >90 mL/min   GFR calc Af Amer 85 (*) >90 mL/min   Comment: (NOTE)     The eGFR has been calculated using the CKD EPI equation.     This calculation has not been validated in all clinical situations.     eGFR's persistently <90 mL/min signify possible Chronic Kidney     Disease.      Constitutional: He appears well-developed and well-nourished.  HENT:  Head: Normocephalic and atraumatic.  Eyes: Conjunctivae are normal. Pupils are equal, round, and reactive to light.  Neck: Normal range of motion. Neck supple.  Cardiovascular: Regular rhythm. Tachycardia present.  Respiratory: Effort normal and breath sounds normal. No respiratory distress. He has no wheezes.   GI: Soft. Bowel sounds are normal. He exhibits no distension. There is no tenderness.  Neurological: He is alert.   Flat affect with delayed processing. Restless and distracted. Minimal verbal output. Dense right hemiparesis with sensory deficits. Moves LUE/LLE with visual/verbal cues. Unable to follow simple commands. RUE and RLE are 0/5. Marland Kitchen Able to only state name, moderate dysarthria Skin: Skin is warm and dry     Assessment/Plan: 1. Functional deficits secondary to Left MCA infarct, R flaccid hemiplegia, aphasia, dysphagia which require 3+ hours per day of interdisciplinary therapy in a  comprehensive inpatient rehab setting. Physiatrist is providing close team supervision and 24 hour management of active medical problems listed below. Physiatrist and rehab team continue to assess barriers to discharge/monitor patient progress toward functional and medical goals.  FIM: FIM - Bathing Bathing Steps Patient Completed: Chest;Right Arm;Abdomen;Right upper leg;Left upper leg Bathing: 1: Two helpers  FIM - Upper Body Dressing/Undressing Upper body dressing/undressing steps patient completed: Thread/unthread left sleeve of pullover shirt/dress Upper body dressing/undressing: 2: Max-Patient completed 25-49% of tasks FIM - Lower Body Dressing/Undressing Lower body dressing/undressing steps patient completed: Thread/unthread left pants leg Lower body dressing/undressing: 1: Two helpers  FIM - Toileting Toileting: 0: Activity did not occur  FIM - Air cabin crew Transfers: 1-Two helpers  FIM - Control and instrumentation engineer Devices: Arm rests Bed/Chair Transfer: 2: Bed > Chair or W/C: Max A (lift and lower assist);2: Chair or W/C > Bed: Max A (lift and lower assist)  FIM - Locomotion: Wheelchair Distance: 150 Locomotion: Wheelchair: 0: Activity did not occur FIM - Locomotion: Ambulation Locomotion: Ambulation Assistive Devices: MaxiSky Ambulation/Gait Assistance: 1: +2 Total assist Locomotion: Ambulation: 0: Activity did not occur  Comprehension Comprehension Mode: Auditory Comprehension: 2-Understands basic 25 - 49% of the time/requires cueing 51 - 75% of the time  Expression Expression Mode: Verbal Expression: 2-Expresses basic 25 - 49% of the time/requires cueing 50 - 75% of the time. Uses single words/gestures.  Social Interaction Social Interaction: 2-Interacts appropriately 25 - 49% of time - Needs frequent redirection.  Problem  Solving Problem Solving: 1-Solves basic less than 25% of the time - needs direction nearly all the time or  does not effectively solve problems and may need a restraint for safety  Memory Memory: 1-Recognizes or recalls less than 25% of the time/requires cueing greater than 75% of the time  Medical Problem List and Plan:  1. 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  Doppler no carotid stenosis 2. DVT Prophylaxis/Anticoagulation: Subcutaneous Lovenox initiated 05/05/2013. Monitor platelet counts any signs of bleeding last Plt 231K 3. Pain Management: Tylenol as needed  4. Neuropsych: This patient is not capable of making decisions on his own behalf.  5. Dysphagia. Dysphagia 1 honey thick liquids. Monitor closely for any aspiration. Followup speech therapy  6. Hypertension.Uncontrolled with tachycardia Norvasc 10 mg daily, hydrochlorothiazide 25 mg daily. Monitor with increased mobility, titrate BB 7. OSA. CPAP. Will follow pulmonary services as needed  8.  R knee effusion mild OA, elevated urate,Gout resolved after celebrex       LOS (Days) 16 A FACE TO FACE EVALUATION WAS PERFORMED  Raynell Upton E 05/22/2013, 8:02 AM

## 2013-05-22 NOTE — Progress Notes (Signed)
Pt. Refused cpap. RT informed pt. To notify if he changes his mind. 

## 2013-05-22 NOTE — Progress Notes (Signed)
Pt had incont BM this morning, noted stool all over pts hand. When assisting with hygiene, found pt to have hard stool stuck in rectal opening. Disimpacted pt of a large amount of hard stool, with much soft stool left in rectal vault afterward. Continue to monitor. Mick SellShannon Loanne Emery RN

## 2013-05-22 NOTE — Plan of Care (Signed)
Problem: RH BOWEL ELIMINATION Goal: RH STG MANAGE BOWEL W/MEDICATION W/ASSISTANCE STG Manage Bowel with Medication with mod Assistance.  Outcome: Not Progressing Pt incont of stool x2 today. Put hands in stool both times. Required disimpaction today.

## 2013-05-23 ENCOUNTER — Inpatient Hospital Stay (HOSPITAL_COMMUNITY): Payer: Medicare Other | Admitting: Rehabilitation

## 2013-05-23 ENCOUNTER — Encounter (HOSPITAL_COMMUNITY): Payer: Medicare Other | Admitting: Occupational Therapy

## 2013-05-23 ENCOUNTER — Inpatient Hospital Stay (HOSPITAL_COMMUNITY): Payer: Medicare Other | Admitting: Speech Pathology

## 2013-05-23 ENCOUNTER — Inpatient Hospital Stay (HOSPITAL_COMMUNITY): Payer: Medicare Other | Admitting: Occupational Therapy

## 2013-05-23 DIAGNOSIS — G8110 Spastic hemiplegia affecting unspecified side: Secondary | ICD-10-CM

## 2013-05-23 DIAGNOSIS — I619 Nontraumatic intracerebral hemorrhage, unspecified: Secondary | ICD-10-CM

## 2013-05-23 DIAGNOSIS — M109 Gout, unspecified: Secondary | ICD-10-CM

## 2013-05-23 DIAGNOSIS — I69920 Aphasia following unspecified cerebrovascular disease: Secondary | ICD-10-CM

## 2013-05-23 NOTE — Progress Notes (Signed)
Physical Therapy Session Note  Patient Details  Name: [name] MRN: [id number] Date of Birth: [birth year]  Today's Date: 05/23/2013 Time: 1105-1150 Time Calculation (min): 45 min  Short Term Goals: Week 2:  PT Short Term Goal 1 (Week 2): Patient will be able to perform bed mobility with mod assist. PT Short Term Goal 2 (Week 2): Patient will be able to perform transfers with Max-assist of one person PT Short Term Goal 3 (Week 2): Patient will be able to perform static sitting balance x 3-5 mins with min assist. PT Short Term Goal 4 (Week 2): Pt will perform dynamic sitting balance at max assist level consistently.  PT Short Term Goal 5 (Week 2): Patient will be able to propel w/c 4450' with Mod-assist.  Skilled Therapeutic Interventions/Progress Updates:   Pt received sitting in w/c in therapy gym with rehab tech present.  Assisted to mat surface in order to perform gait training with use of maxi sky system.  Note pt with increased pain in R knee and R shoulder during therapy.  Also note R knee warm and swollen and R wrist also warm to touch.  Transferred pt via bobath method w/c > mat with +2 total assist (pt assist 20%).  Note pt very resistant to all movement today and unwilling to assist with any tasks.  Performed approx 10' gait at +2 assist (actually noted decreased pushing in standing) with PT under pts R axilla to provide assist at ribs and hips for increased weight shift to the L to clear RLE and also assist at R knee during R stance to prevent buckle.  Rehab tech on pts L to provide HHA and assist with L weight shift.  Also provided pt with mirror for increased visual feedback during gait.  Upon ambulating 10', note pt to be incontinent of urine, therefore assisted back to chair and into restroom in room.  Performed squat pivot in method stated above to drop arm 3in1 commode and performed another rep of mini standing in order to adjust clothing prior to continuing to toilet.  Pt unable  to void urine in commode, however did have bowel movement while on 3in1.  Note pt continued to be very resistant to verbal and manual cues in restroom with severe R lateral lean and no attention at all to RUE.  Pt attempted to grab therapist arm and push away, however redirected pts L hand and provided max verbal cues and education that pt would fall if therapist did not assist.  Pt then states to therapist "move out of the way" when getting ready to transfer back to w/c.  Again, provided max education that pt unable to transfer by himself at this time.  Pt left in w/c with quick release belt, half lap tray and assisted to nursing station for increased supervision/safety.  RN notified of pain, warmth and swelling in RLE/UE and bowel movement.    Therapy Documentation Precautions:  Precautions Precautions: Fall Precaution Comments: R sided inattention,Dense Right hemiparesis,  pusher toward Right, global aphasia deficits Restrictions Weight Bearing Restrictions: No General: Amount of Missed PT Time (min): 15 Minutes Missed Time Reason: Patient unwilling/refused to participate without medical reason   Pain: Pain Assessment Pain Assessment: Faces Faces Pain Scale: Hurts little more Pain Type: Acute pain Pain Location: Knee Pain Orientation: Right Pain Descriptors / Indicators: Grimacing Pain Intervention(s): Repositioned   Locomotion : Ambulation Ambulation/Gait Assistance: 1: +2 Total assist   See FIM for current functional status  Therapy/Group: Individual Therapy  Vista Deck 05/23/2013, 1:08 PM

## 2013-05-23 NOTE — Progress Notes (Signed)
Patient refused to wear CPAP tonight.  Was told if he changed his mind to call.

## 2013-05-23 NOTE — Progress Notes (Signed)
Speech Language Pathology Daily Session Note  Patient Details  Name: [name] MRN: [id number] Date of Birth: 1950/12/08  Today's Date: 05/23/2013 Time: [id number] Time Calculation (min): 40 min  Short Term Goals: Week 2: SLP Short Term Goal 1 (Week 2): Pt will utilize safe swallowing strategies with Dys 2 textures and nectar-thick liquids with Mod cues  SLP Short Term Goal 2 (Week 2): Pt will consume trials of thin liquids with small sips and minimal overt s/s of aspiration with Max cues SLP Short Term Goal 3 (Week 2): Pt will sustain attention to basic functional task for 5 minutes with Max cues SLP Short Term Goal 4 (Week 2): Pt will communicate basic wants/needs via multimodal communication with Mod cues SLP Short Term Goal 5 (Week 2): Pt will follow one-step commands with Max cues SLP Short Term Goal 6 (Week 2): Pt will receptively identify common objects from a field of four with Supervision  cues  Skilled Therapeutic Interventions: Skilled treatment session focused on addressing goals.  SLP attempted to facilitate session by re-positioning patient in bed; however, patient grimaced and groaned in pain with each attempt to move him; patient verbally and physically resistant to SLP assisting him with hand over hand cues for him to attempt to use bed rail to re-position self.   As a result, SLP unable to proceed with p.o. or language therapy until RN administered medications.  SLP facilitated self-care with Total assist for oral care via suctioning.  SLP also utilized skilled observation of RN administering meds whole in puree with inability to orally transit x1; with Min cues patient was able to then consume medication.  Continue with current plan of care.    FIM:  Comprehension Comprehension Mode: Auditory Comprehension: 2-Understands basic 25 - 49% of the time/requires cueing 51 - 75% of the time Expression Expression Mode: Verbal Expression: 2-Expresses basic 25 - 49% of the  time/requires cueing 50 - 75% of the time. Uses single words/gestures. Social Interaction Social Interaction: 2-Interacts appropriately 25 - 49% of time - Needs frequent redirection. Problem Solving Problem Solving: 1-Solves basic less than 25% of the time - needs direction nearly all the time or does not effectively solve problems and may need a restraint for safety Memory Memory: 1-Recognizes or recalls less than 25% of the time/requires cueing greater than 75% of the time  Pain Pain Assessment Pain Assessment: Faces Faces Pain Scale: Hurts whole lot Pain Type: Acute pain Pain Location: Knee Pain Orientation: Right Pain Descriptors / Indicators: Grimacing Pain Intervention(s): RN made aware  Therapy/Group: Individual Therapy  [name], M.A., CCC-SLP [phone]  [name] 05/23/2013, 1:19 PM

## 2013-05-23 NOTE — Progress Notes (Signed)
Subjective/Complaints: "took off Oxygen about 20 min ago" Review of Systems - not able to obtain secondary to aphasia Objective: Vital Signs: Blood pressure 145/86, pulse 53, temperature 98.5 F (36.9 C), temperature source Oral, resp. rate 19, height 6\' 5"  (1.956 m), weight 84.278 kg (185 lb 12.8 oz), SpO2 92.00%. No results found. No results found for this or any previous visit (from the past 72 hour(s)).    Constitutional: He appears well-developed and well-nourished.  HENT:  Head: Normocephalic and atraumatic.  Eyes: Conjunctivae are normal. Pupils are equal, round, and reactive to light.  Neck: Normal range of motion. Neck supple.  Cardiovascular: Regular rhythm. Tachycardia present.  Respiratory: Effort normal and breath sounds normal. No respiratory distress. He has no wheezes.   GI: Soft. Bowel sounds are normal. He exhibits no distension. There is no tenderness.  Neurological: He is alert.   Flat affect with delayed processing. Restless and distracted. Minimal verbal output. Dense right hemiparesis with sensory deficits. Moves LUE/LLE with visual/verbal cues. Unable to follow simple commands. RUE and RLE are 0/5. Marland Kitchen. Able to only state name, moderate dysarthria Skin: Skin is warm and dry     Assessment/Plan: 1. 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 Physiatrist is providing close team supervision and 24 hour management of active medical problems listed below. Physiatrist and rehab team continue to assess barriers to discharge/monitor patient progress toward functional and medical goals.  FIM: FIM - Bathing Bathing Steps Patient Completed: Chest;Right Arm;Abdomen;Right upper leg;Left upper leg Bathing: 1: Two helpers  FIM - Upper Body Dressing/Undressing Upper body dressing/undressing steps patient completed:  Thread/unthread left sleeve of pullover shirt/dress Upper body dressing/undressing: 2: Max-Patient completed 25-49% of tasks FIM - Lower Body Dressing/Undressing Lower body dressing/undressing steps patient completed: Thread/unthread left pants leg Lower body dressing/undressing: 1: Two helpers  FIM - Toileting Toileting: 0: Activity did not occur  FIM - ArchivistToilet Transfers Toilet Transfers: 1-Two helpers  FIM - BankerBed/Chair Transfer Bed/Chair Transfer Assistive Devices: Arm rests Bed/Chair Transfer: 2: Bed > Chair or W/C: Max A (lift and lower assist);2: Chair or W/C > Bed: Max A (lift and lower assist)  FIM - Locomotion: Wheelchair Distance: 150 Locomotion: Wheelchair: 0: Activity did not occur FIM - Locomotion: Ambulation Locomotion: Ambulation Assistive Devices: MaxiSky Ambulation/Gait Assistance: 1: +2 Total assist Locomotion: Ambulation: 0: Activity did not occur  Comprehension Comprehension Mode: Auditory Comprehension: 2-Understands basic 25 - 49% of the time/requires cueing 51 - 75% of the time  Expression Expression Mode: Verbal Expression: 2-Expresses basic 25 - 49% of the time/requires cueing 50 - 75% of the time. Uses single words/gestures.  Social Interaction Social Interaction: 2-Interacts appropriately 25 - 49% of time - Needs frequent redirection.  Problem Solving Problem Solving: 1-Solves basic less than 25% of the time - needs direction nearly all the time or does not effectively solve problems and may need a restraint for safety  Memory Memory: 1-Recognizes or recalls less than 25% of the time/requires cueing greater than 75% of the time  Medical Problem List and Plan:  1. 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  Doppler no carotid stenosis 2. DVT Prophylaxis/Anticoagulation: Subcutaneous Lovenox initiated 05/05/2013. Monitor platelet counts any signs of bleeding last Plt 231K 3. Pain Management: Tylenol as needed  4.  Neuropsych: This patient is not capable of making decisions on his own behalf.  5. Dysphagia.  Dysphagia 1 honey thick liquids. Monitor closely for any aspiration. Followup speech therapy  6. Hypertension.Uncontrolled with tachycardia Norvasc 10 mg daily, hydrochlorothiazide 25 mg daily. Monitor with increased mobility, titrate BB 7. OSA. CPAP. Will follow pulmonary services as needed  8.  R knee effusion mild OA, elevated urate,Gout resolved after celebrex       LOS (Days) 17 A FACE TO FACE EVALUATION WAS PERFORMED  Darry Kelnhofer E 05/23/2013, 6:43 AM

## 2013-05-23 NOTE — Progress Notes (Signed)
Occupational Therapy Session Note  Patient Details  Name: [name] MRN: [id number] Date of Birth: 13-Nov-1950  Today's Date: 05/23/2013 Time: 1331-1400 Time Calculation (min): 29 min  Short Term Goals: Week 2:  OT Short Term Goal 1 (Week 2): Pt will sit statically EOB with close supervision for 5 mins in preparation for selfcare tasks. OT Short Term Goal 2 (Week 2): Pt will perform UB bathing with min assist and mod demonstrational cueing supported in wheelchair. OT Short Term Goal 3 (Week 2): Pt will perform LB bathing sit to stand with total assist pt 60 %. OT Short Term Goal 4 (Week 2): Pt will perform toilet transfer with max assist to the right side. OT Short Term Goal 5 (Week 2): Pt/family will return demonstrate safe performance of PROM/AAROM exercises for the RUE.  Skilled Therapeutic Interventions/Progress Updates:    Pt worked on sitting balance EOM during session.  Pt transferred to the left with total assist.  In sitting pt with increased right lean secondary to pushing to the right.  He was not able to maintain sitting balance for more than a few seconds at a time.  Focused on having pt maintain sustained attention to functional task of placing cards on the bedside table placed on his left side.  Pt initially needing hand over hand assist to laterally reach to the left and place the cards.  Eventually progressed to reaching across his body with the RUE and total assist to promote weight shift to the left.  Pt with increased anxiety and pain with movement.  Demonstrated pain in the right calf when attempting to place his foot on the footrest once we transferred back to the wheelchair.  Pt also with increased swelling in his right knee with pain as well.  Muscular pain present in the left upper trap in addition to the other sites.   Therapy Documentation Precautions:  Precautions Precautions: Fall Precaution Comments: R sided inattention,Dense Right hemiparesis,  pusher toward  Right, global aphasia deficits Restrictions Weight Bearing Restrictions: No  Pain: Pain Assessment Pain Assessment: Faces Faces Pain Scale: Hurts even more Pain Type: Acute pain Pain Location: Calf Pain Orientation: Right Pain Descriptors / Indicators: Grimacing Pain Intervention(s): Repositioned ADL: See FIM for current functional status  Therapy/Group: Individual Therapy  Neilah Fulwider OTR/L 05/23/2013, 3:47 PM

## 2013-05-23 NOTE — Progress Notes (Signed)
Occupational Therapy Session Note  Patient Details  Name: [name] MRN: [id number] Date of Birth: December 15, 1950  Today's Date: 05/23/2013 Time: 1003-1100 Time Calculation (min): 57 min  Short Term Goals: Week 2:  OT Short Term Goal 1 (Week 2): Pt will sit statically EOB with close supervision for 5 mins in preparation for selfcare tasks. OT Short Term Goal 2 (Week 2): Pt will perform UB bathing with min assist and mod demonstrational cueing supported in wheelchair. OT Short Term Goal 3 (Week 2): Pt will perform LB bathing sit to stand with total assist pt 60 %. OT Short Term Goal 4 (Week 2): Pt will perform toilet transfer with max assist to the right side. OT Short Term Goal 5 (Week 2): Pt/family will return demonstrate safe performance of PROM/AAROM exercises for the RUE.  Skilled Therapeutic Interventions/Progress Updates:    Pt performed bathing and dressing at the sink this session.  Needed max coaxing to transfer OOB, which was performed squat pivot with total assist.  Pt kept eyes closed majority of session and actively participated very little in session.  He was more lethargic than in previous sessions and nursing was made aware.  Total assist for the ADL with total assist +2  (pt 20%)for sit to stand while therapists washed his peri area and pulled up his pants.  Pt needing max assist to sit up out of the back of the chair and when he was assisted he became more agitated.  Unable to maintain static sitting balance EOC without total assist this session.  Pt tends to grimace with pain in the right knee with positioning as well as in the posterior left shoulder.    Therapy Documentation Precautions:  Precautions Precautions: Fall Precaution Comments: R sided inattention,Dense Right hemiparesis,  pusher toward Right, global aphasia deficits Restrictions Weight Bearing Restrictions: No  Pain: Pain Assessment Pain Assessment: Faces Faces Pain Scale: Hurts little more Pain Type:  Acute pain Pain Location: Knee Pain Orientation: Right Pain Descriptors / Indicators: Grimacing Pain Intervention(s): Repositioned ADL: See FIM for current functional status  Therapy/Group: Individual Therapy  Ioana Louks OTR/L 05/23/2013, 12:30 PM

## 2013-05-24 ENCOUNTER — Inpatient Hospital Stay (HOSPITAL_COMMUNITY): Payer: Medicare Other | Admitting: Speech Pathology

## 2013-05-24 ENCOUNTER — Inpatient Hospital Stay (HOSPITAL_COMMUNITY): Payer: Medicare Other

## 2013-05-24 ENCOUNTER — Encounter (HOSPITAL_COMMUNITY): Payer: Medicare Other

## 2013-05-24 ENCOUNTER — Inpatient Hospital Stay (HOSPITAL_COMMUNITY): Payer: Medicare Other | Admitting: Rehabilitation

## 2013-05-24 DIAGNOSIS — M25579 Pain in unspecified ankle and joints of unspecified foot: Secondary | ICD-10-CM | POA: Diagnosis not present

## 2013-05-24 DIAGNOSIS — I619 Nontraumatic intracerebral hemorrhage, unspecified: Secondary | ICD-10-CM | POA: Diagnosis not present

## 2013-05-24 DIAGNOSIS — M79609 Pain in unspecified limb: Secondary | ICD-10-CM

## 2013-05-24 DIAGNOSIS — S8990XA Unspecified injury of unspecified lower leg, initial encounter: Secondary | ICD-10-CM | POA: Diagnosis not present

## 2013-05-24 DIAGNOSIS — S99919A Unspecified injury of unspecified ankle, initial encounter: Secondary | ICD-10-CM | POA: Diagnosis not present

## 2013-05-24 MED ORDER — ENOXAPARIN SODIUM 30 MG/0.3ML ~~LOC~~ SOLN
30.0000 mg | Freq: Two times a day (BID) | SUBCUTANEOUS | Status: DC
  Administered 2013-05-24 – 2013-06-05 (×25): 30 mg via SUBCUTANEOUS
  Filled 2013-05-24 (×30): qty 0.3

## 2013-05-24 NOTE — Progress Notes (Signed)
Social Work Patient ID: [name] CapriceEdward A [name], male   DOB: [birth year], 63 y.o.   MRN: [id number] [facility], told not medically ready via [facility].  Tests pending and need to await the results first. Will contact facility tomorrow regarding pt being ready.

## 2013-05-24 NOTE — Progress Notes (Signed)
Speech Language Pathology Weekly Progress Note  Patient Details  Name: [name] A Grisso MRN: [id number] Date of Birth: 22-Apr-1951  Today's Date: 05/24/2013  Short Term Goals: Week 2: SLP Short Term Goal 1 (Week 2): Pt will utilize safe swallowing strategies with Dys 2 textures and nectar-thick liquids with Mod cues  SLP Short Term Goal 1 - Progress (Week 2): Progressing toward goal SLP Short Term Goal 2 (Week 2): Pt will consume trials of thin liquids with small sips and minimal overt s/s of aspiration with Max cues SLP Short Term Goal 2 - Progress (Week 2): Progressing toward goal SLP Short Term Goal 3 (Week 2): Pt will sustain attention to basic functional task for 5 minutes with Max cues SLP Short Term Goal 3 - Progress (Week 2): Progressing toward goal SLP Short Term Goal 4 (Week 2): Pt will communicate basic wants/needs via multimodal communication with Mod cues SLP Short Term Goal 4 - Progress (Week 2): Progressing toward goal SLP Short Term Goal 5 (Week 2): Pt will follow one-step commands with Max cues SLP Short Term Goal 5 - Progress (Week 2): Progressing toward goal SLP Short Term Goal 6 (Week 2): Pt will receptively identify common objects from a field of four with Supervision  cues SLP Short Term Goal 6 - Progress (Week 2): Progressing toward goal  Week 3: SLP Short Term Goal 1 (Week 3): Pt will utilize safe swallowing strategies with Dys 2 textures and nectar-thick liquids with Mod cues  SLP Short Term Goal 2 (Week 3): Pt will consume trials of thin liquids with small sips and minimal overt s/s of aspiration with Max cues SLP Short Term Goal 3 (Week 3): Pt will sustain attention to basic functional task for 2 minutes with Max cues SLP Short Term Goal 4 (Week 3): Pt will communicate basic wants/needs via multimodal communication with Mod cues SLP Short Term Goal 5 (Week 3): Pt will follow one-step commands with Max cues SLP Short Term Goal 6 (Week 3): Pt will receptively identify  common objects from a field of four with Supervision cues  Weekly Progress Updates: Patient 0 out of 6 short term goals this reporting period due to limited gains during this reporting period.  Patient's pain and participation are the most limiting actors to his progress.  His poor insight and overall awareness are other hurdles that also hinder his participation and therefore progress.  He continues to require Max cues for safety with p.o. and as a result, he continues to demonstrate significantly impaired cognitive-linguistic skills as well as dysphagia which continue to require skilled SLP services to maximize safety and reduce burden of care prior to discharge to SNF.     SLP Intensity: Minumum of 1-2 x/day, 30 to 90 minutes SLP Frequency: 5 out of 7 days SLP Duration/Estimated Length of Stay: SNF  Charlane FerrettiMelissa Jaziel Bennett, M.A., CCC-SLP [phone]  Melyna Huron 05/24/2013, 8:22 AM

## 2013-05-24 NOTE — Progress Notes (Signed)
Orthopedic Tech Progress Note Patient Details:  [name] 1951/03/14 [id number]  Ortho Devices Type of Ortho Device: Ankle Air splint Ortho Device/Splint Interventions: Application   Shawnie PonsCammer, Faraaz Wolin Carol 05/24/2013, 9:43 AM

## 2013-05-24 NOTE — Progress Notes (Signed)
Physical Therapy Weekly Progress Note  Patient Details  Name: [name] MRN: [id number] Date of Birth: Mar 05, 1951  Today's Date: 05/24/2013 Time:  -     Patient has met 1 of 5 short term goals.  Pt continues to make very little progress with therapy and continues to be very resistant (esp in last few days) to any therapy.  Also noted increased pain/swelling/warmth in R knee and R wrist.  RN has been notified.  Pt continues to be very inconsistent with all levels of mobility and requires max assist to total assist for bed mobility, intermittent min to total assist for dynamic sitting balance, +2 assist for standing activities, and max to +2 assist for transfers.  Continue to feel pts lack of progress mostly due to behavioral issues, however continues to demonstrate increased pusher tendencies, increased motor apraxia, decreased motor planning, aphasia, and decreased functional use of RUE/LE.  Continue to recommend SNF at D/C to maximize pts safety and function.    Patient continues to demonstrate the following deficits: decreased static and dynamic sitting and standing balance, decreased safety awareness, decreased functional use of RUE/LE, decreased motor planning, apraxia, increased pusher tendencies and therefore will continue to benefit from skilled PT intervention to enhance overall performance with activity tolerance, balance, postural control, ability to compensate for deficits, functional use of  right upper extremity and right lower extremity, attention, awareness, coordination and knowledge of precautions.  Patient not progressing toward long term goals.  See goal revision..  Plan of care revisions: Pt to D/C to SNF, therefore downgraded goals and D/C'd car transfer, furniture transfer, home w/c mobility and community w/c mobility goals.  .  PT Short Term Goals Week 2:  PT Short Term Goal 1 (Week 2): Patient will be able to perform bed mobility with mod assist. PT Short Term Goal 1 -  Progress (Week 2): Progressing toward goal PT Short Term Goal 2 (Week 2): Patient will be able to perform transfers with Max-assist of one person PT Short Term Goal 2 - Progress (Week 2): Progressing toward goal (not consistently) PT Short Term Goal 3 (Week 2): Patient will be able to perform static sitting balance x 3-5 mins with min assist. PT Short Term Goal 3 - Progress (Week 2): Met PT Short Term Goal 4 (Week 2): Pt will perform dynamic sitting balance at max assist level consistently.  PT Short Term Goal 4 - Progress (Week 2): Progressing toward goal PT Short Term Goal 5 (Week 2): Patient will be able to propel w/c 41' with Mod-assist. PT Short Term Goal 5 - Progress (Week 2): Progressing toward goal Week 3:  PT Short Term Goal 1 (Week 3): =LTG's due to expected LOS  Skilled Therapeutic Interventions/Progress Updates:   Pt received lying in bed this morning.  Provided max verbal encouragement to get OOB.  Provided total assist for donning pants, TEDS hose and shoes this morning.  Performing rolling R and L at total assist in order to adjust clothing properly.  Pt did initiate sitting up at EOB with moving LLE out of bed and scooting hips over in bed.  Provided assist for RLE and provided max verbal cues to use handrail to self assist trunk.  He was unable to fully elevate trunk, therefore provided max assist.  Once sitting at EOB, note increased pushing to the R and had pt rest on L elbow to decrease pushing.  Pt unable to maintain L elbow propped position without losing balance backwards and requiring max  assist from rehab tech.  Performed squat pivot transfer using bobath technique into chair at +2 assist today as pt did not initiate any voluntary movement during transfer.  Once in chair, assisted pt in front of mirror so that pt could perform oral care with use of sponge.  Provided hand over hand cues to grasp sponge and also to grasp cup to spit water.  Assisted pt into hallway for attempted  w/c mobility training.  Pt performed approx 100' at max assist with max verbal and hand over hands cues for using LUE/LE to propel.  Pt very resistant when attempting to assist hand onto wheel, however he was able to propel somewhat at very slow speed.  Once in gym, performed 3 reps of sit <> stand facing // bars for use of bar for UE support.  Requires +2 assist with heavy R lateral lean despite max verbal and facilitation cues.  Pt returned to nursing station with quick release belt and half lap tray in place.    Therapy Documentation Precautions:  Precautions Precautions: Fall Precaution Comments: R sided inattention,Dense Right hemiparesis,  pusher toward Right, global aphasia deficits Restrictions Weight Bearing Restrictions: No   Vital Signs: Therapy Vitals Temp: 98.9 F (37.2 C) Temp src: Oral Pulse Rate: 83 Resp: 18 BP: 145/76 mmHg Patient Position, if appropriate: Lying Oxygen Therapy SpO2: 92 % O2 Device: None (Room air) Pain: Pt would grimace when moving RLE, however when applied pressure to assess exactly where pain was, pt shook head no and did not grimace.      See FIM for current functional status  Therapy/Group: Individual Therapy  Denice Bors 05/24/2013, 8:13 AM

## 2013-05-24 NOTE — Progress Notes (Signed)
Patient refusing to wear CPAP at this time.

## 2013-05-24 NOTE — Progress Notes (Signed)
Occupational Therapy Session Note  Patient Details  Name: [name] MRN: [id number] Date of Birth: January 28, 1951  Today's Date: 05/24/2013 Time: 1300-1330 Time Calculation (min): 30 min  Short Term Goals: Week 2:  OT Short Term Goal 1 (Week 2): Pt will sit statically EOB with close supervision for 5 mins in preparation for selfcare tasks. OT Short Term Goal 2 (Week 2): Pt will perform UB bathing with min assist and mod demonstrational cueing supported in wheelchair. OT Short Term Goal 3 (Week 2): Pt will perform LB bathing sit to stand with total assist pt 60 %. OT Short Term Goal 4 (Week 2): Pt will perform toilet transfer with max assist to the right side. OT Short Term Goal 5 (Week 2): Pt/family will return demonstrate safe performance of PROM/AAROM exercises for the RUE.  Skilled Therapeutic Interventions/Progress Updates:    Initially attempted to perform squat pivot transfer but patient indicated increased pain when therapist repositioned RLE.  Pt was resistant to transfer and remainder of session completed at w/c level.  Pt engaged in therapeutic activities including catching small ball with left hand and attempting to toss ball.  Pt unable to release ball when attempting to toss.  Pt engaged in grasping dowel with right hand requiring HOH assist to participate in BUE exercise.  Pt kept eyes closed approx 90% of time during session and required max verbal cues to initiate tasks.  Pt was unable to self correct sitting balance in w/c and require max A.  Focus on active participation, activity tolerance, task initiation, and attention to task.  Therapy Documentation Precautions:  Precautions Precautions: Fall Precaution Comments: R sided inattention,Dense Right hemiparesis,  pusher toward Right, global aphasia deficits Restrictions Weight Bearing Restrictions: No General: General Amount of Missed OT Time (min): 60 Minutes Vital Signs:   Pain: Pain Assessment Pain Assessment:  Faces Faces Pain Scale: Hurts whole lot Pain Location: Ankle Pain Orientation: Right Pain Intervention(s): Repositioned;RN made aware  See FIM for current functional status  Therapy/Group: Individual Therapy  Rich BraveLanier, [name] 05/24/2013, 2:34 PM

## 2013-05-24 NOTE — Progress Notes (Signed)
Speech Language Pathology Daily Session Note  Patient Details  Name: [name] MRN: [id number] Date of Birth: 06/09/1950  Today's Date: 05/24/2013 Time: 1330-1410 Time Calculation (min): 40 min  Short Term Goals: Week 3: SLP Short Term Goal 1 (Week 3): Pt will utilize safe swallowing strategies with Dys 2 textures and nectar-thick liquids with Mod cues  SLP Short Term Goal 2 (Week 3): Pt will consume trials of thin liquids with small sips and minimal overt s/s of aspiration with Max cues SLP Short Term Goal 3 (Week 3): Pt will sustain attention to basic functional task for 2 minutes with Max cues SLP Short Term Goal 4 (Week 3): Pt will communicate basic wants/needs via multimodal communication with Mod cues SLP Short Term Goal 5 (Week 3): Pt will follow one-step commands with Max cues SLP Short Term Goal 6 (Week 3): Pt will receptively identify common objects from a field of four with Supervision cues  Skilled Therapeutic Interventions: Skilled treatment session focused on addressing cognitive-linguistic and dysphagia goals.  Patient requested drink with Max assist clinician question cues and direct cues to verbalize yes/no responses.  Patient required Max tactile cues for portion control and pacing during consumption of nectar-thick liquids via cup.  SLP also facilitated session with Max encouragement to participate throughout language tasks.  Patient completed confrontational naming task; patient spontaneously named 2/5 objects correctly and semantic and phonemic cues ere not effective for naming errors.  Patient also accurately identified objects in a field of 4 in 2/5 opportunities, cues were again not effective due to eye closing and participation.  Patient accurately followed 1/5 1-step directions and verbal/visual cues ere effective increasing accuracy to 3/5.   Session was ended with a 2+ transfer back to bed and Total assist to complete oral care.  Continue with current plan of care.    FIM:  Comprehension Comprehension Mode: Auditory Comprehension: 2-Understands basic 25 - 49% of the time/requires cueing 51 - 75% of the time Expression Expression Mode: Verbal Expression: 2-Expresses basic 25 - 49% of the time/requires cueing 50 - 75% of the time. Uses single words/gestures. Social Interaction Social Interaction: 2-Interacts appropriately 25 - 49% of time - Needs frequent redirection. Problem Solving Problem Solving: 1-Solves basic less than 25% of the time - needs direction nearly all the time or does not effectively solve problems and may need a restraint for safety Memory Memory: 1-Recognizes or recalls less than 25% of the time/requires cueing greater than 75% of the time  Pain Pain Assessment Pain Assessment: Faces Faces Pain Scale: Hurts whole lot Pain Location: Ankle Pain Orientation: Right Pain Intervention(s): Repositioned;RN made aware  Therapy/Group: Individual Therapy  Charlane FerrettiMelissa Shanti Agresti, M.A., CCC-SLP [phone]  [name] 05/24/2013, 4:28 PM

## 2013-05-24 NOTE — Progress Notes (Signed)
Pt refuses to wear CPAP. No distress noted at this time. 

## 2013-05-24 NOTE — Progress Notes (Signed)
Social Work Patient ID: [name] CapriceEdward A Saxton, male   DOB: 1950/10/17, 63 y.o.   MRN: [id number] Spoke with Sandy-pt's sister to inform vascular test today and also to inform potential bed offer.  She became very upset and informed worker she has been told all along He would be here 30 days and then go to NH.  Tried to discuss he was brought to lessen the burden of care and then go to NH, but she replied: " I was never told this and you all are giving up on him and throwing Him in the dumpster."  She believes this is all about money and [employer] Care.  Informed her he has been here 18 days already and was still max assist level and was not participating in therapies. Offered her to talk with MD and she declined at this time.  She wants him to stay here as long as possible and get the intensive rehab he needs.  Will talk with tomorrow once test results come back On pt's vascular tests and go from there regarding his medically readiness for transfer.

## 2013-05-24 NOTE — Progress Notes (Signed)
Subjective/Complaints: "took off Oxygen about 20 min ago" Review of Systems - not able to obtain secondary to aphasia Objective: Vital Signs: Blood pressure 145/76, pulse 83, temperature 98.9 F (37.2 C), temperature source Oral, resp. rate 18, height 6\' 5"  (1.956 m), weight 84.278 kg (185 lb 12.8 oz), SpO2 92.00%. No results found. No results found for this or any previous visit (from the past 72 hour(s)).    Constitutional: He appears well-developed and well-nourished.  HENT:  Head: Normocephalic and atraumatic.  Eyes: Conjunctivae are normal. Pupils are equal, round, and reactive to light.  Neck: Normal range of motion. Neck supple.  Cardiovascular: Regular rhythm. Tachycardia present.  Respiratory: Effort normal and breath sounds normal. No respiratory distress. He has no wheezes.   GI: Soft. Bowel sounds are normal. He exhibits no distension. There is no tenderness.  Neurological: He is alert.   Flat affect with delayed processing. Restless and distracted. Minimal verbal output. Dense right hemiparesis with sensory deficits. Moves LUE/LLE with visual/verbal cues. Unable to follow simple commands. RUE and RLE are 0/5. [name] Kitchen. Able to only state name, moderate dysarthria Skin: Skin is warm and dry     Assessment/Plan: 1. 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 Physiatrist is providing close team supervision and 24 hour management of active medical problems listed below. Physiatrist and rehab team continue to assess barriers to discharge/monitor patient progress toward functional and medical goals.  FIM: FIM - Bathing Bathing Steps Patient Completed: Left upper leg Bathing: 1: Two helpers  FIM - Upper Body Dressing/Undressing Upper body dressing/undressing steps patient completed: Thread/unthread left sleeve of pullover  shirt/dress Upper body dressing/undressing: 2: Max-Patient completed 25-49% of tasks FIM - Lower Body Dressing/Undressing Lower body dressing/undressing steps patient completed: Thread/unthread left pants leg Lower body dressing/undressing: 1: Two helpers  FIM - Toileting Toileting: 1: Two helpers  FIM - ArchivistToilet Transfers Toilet Transfers: 1-Two helpers  FIM - BankerBed/Chair Transfer Bed/Chair Transfer Assistive Devices: Arm rests Bed/Chair Transfer: 1: Two helpers  FIM - Locomotion: Wheelchair Distance: 150 Locomotion: Wheelchair: 0: Activity did not occur FIM - Locomotion: Ambulation Locomotion: Health visitorAmbulation Assistive Devices: MaxiSky Ambulation/Gait Assistance: 1: +2 Total assist Locomotion: Ambulation: 1: Two helpers  Comprehension Comprehension Mode: Auditory Comprehension: 2-Understands basic 25 - 49% of the time/requires cueing 51 - 75% of the time  Expression Expression Mode: Verbal Expression: 2-Expresses basic 25 - 49% of the time/requires cueing 50 - 75% of the time. Uses single words/gestures.  Social Interaction Social Interaction: 2-Interacts appropriately 25 - 49% of time - Needs frequent redirection.  Problem Solving Problem Solving: 1-Solves basic less than 25% of the time - needs direction nearly all the time or does not effectively solve problems and may need a restraint for safety  Memory Memory: 1-Recognizes or recalls less than 25% of the time/requires cueing greater than 75% of the time  Medical Problem List and Plan:  1. 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  Doppler no carotid stenosis 2. DVT Prophylaxis/Anticoagulation: Subcutaneous Lovenox initiated 05/05/2013. Monitor platelet counts any signs of bleeding last Plt 231K 3. Pain Management: Tylenol as needed , R ankle pain question strain,but +Homan's check doppler and R ankle xray 4. Neuropsych: This patient is not capable of making decisions on his own behalf.   5. Dysphagia. Dysphagia 1 honey thick liquids. Monitor closely for any aspiration. Followup speech therapy  6.  Hypertension.Uncontrolled with tachycardia Norvasc 10 mg daily, hydrochlorothiazide 25 mg daily. Monitor with increased mobility, titrate BB 7. OSA. CPAP. Will follow pulmonary services as needed  8.  R knee effusion mild OA, elevated urate,Gout resolved after celebrex still has mild pain with knee ROM      LOS (Days) 18 A FACE TO FACE EVALUATION WAS PERFORMED  Marvin Grabill [name] 05/24/2013, 6:21 AM

## 2013-05-24 NOTE — Plan of Care (Signed)
Problem: RH Car Transfers Goal: LTG Patient will perform car transfers with assist (PT) LTG: Patient will perform car transfers with assistance (PT).  Outcome: Not Applicable Date Met:  56/25/63 Pt to D/C to SNF, therefore will now D/C this goal.   Problem: RH Furniture Transfers Goal: LTG Patient will perform furniture transfers w/assist (OT/PT LTG: Patient will perform furniture transfers with assistance (OT/PT).  Outcome: Not Applicable Date Met:  89/37/34 Pt now to D/C to SNF, therefore will D/C this goal.   Problem: RH Wheelchair Mobility Goal: LTG Patient will propel w/c in home environment (PT) LTG: Patient will propel wheelchair in home environment, # of feet with assistance (PT).  Outcome: Not Applicable Date Met:  28/76/81 Pt will now be D/C to SNF, therefore will D/C this goal.  Goal: LTG Patient will propel w/c in community environment (PT) LTG: Patient will propel wheelchair in community environment, # of feet with assist (PT)  Outcome: Not Applicable Date Met:  15/72/62 Pt will now be D/C to SNF, therefore will D/C this goal.   Comments:  Downgraded all goals to max to total assist (except standing +2 assist) due to lack of progress.

## 2013-05-24 NOTE — Progress Notes (Signed)
Occupational Therapy Note  Patient Details  Name: [name] A Swetz MRN: [id number] Date of Birth: 16-Feb-1951 Today's Date: 05/24/2013  Pt missed 60 mins skilled OT services.  Attempted to engage patient in BADLs.  Pt continually refused verbally and physically.  Pt refused washing face and would not hold wash cloth to participate.  Pt moved head from side to side when therapist attempted to wash pt's face.  Pt contiually yelled when repositioned in bed and repeatedly stated No...No...No.   Rich BraveLanier, Mimie Goering Chappell 05/24/2013, 10:37 AM

## 2013-05-24 NOTE — Progress Notes (Signed)
Calf DVT, change lovenox to 30mg  BID will repeat doppler on 1/26 Ankle xray negative

## 2013-05-24 NOTE — Progress Notes (Signed)
Bilateral lower extremity venous duplex completed.  Right:  DVT noted in the peroneal vein.  No evidence of superficial thrombosis.  No Baker's cyst.  Left:  No evidence of DVT, superficial thrombosis, or Baker's cyst.   

## 2013-05-25 ENCOUNTER — Inpatient Hospital Stay (HOSPITAL_COMMUNITY): Payer: Medicare Other | Admitting: Speech Pathology

## 2013-05-25 ENCOUNTER — Encounter (HOSPITAL_COMMUNITY): Payer: Medicare Other | Admitting: Occupational Therapy

## 2013-05-25 ENCOUNTER — Inpatient Hospital Stay (HOSPITAL_COMMUNITY): Payer: Medicare Other | Admitting: Rehabilitation

## 2013-05-25 LAB — BASIC METABOLIC PANEL
BUN: 35 mg/dL — ABNORMAL HIGH (ref 6–23)
CHLORIDE: 103 meq/L (ref 96–112)
CO2: 22 mEq/L (ref 19–32)
Calcium: 9.5 mg/dL (ref 8.4–10.5)
Creatinine, Ser: 1.28 mg/dL (ref 0.50–1.35)
GFR calc non Af Amer: 58 mL/min — ABNORMAL LOW (ref >90)
GFR, EST AFRICAN AMERICAN: 68 mL/min — AB (ref >90)
Glucose, Bld: 99 mg/dL (ref 70–99)
POTASSIUM: 4 meq/L (ref 3.7–5.3)
Sodium: 141 mEq/L (ref 137–147)

## 2013-05-25 NOTE — Patient Care Conference (Signed)
Inpatient RehabilitationTeam Conference and Plan of Care Update Date: 05/25/2013   Time: 11;30 AM    Patient Name: [name]      Medical Record Number: [id number]  Date of Birth: [birth year] Sex: Male         Room/Bed: 4W09C/4W09C-01 Payor Info: Payor: MEDICARE / Plan: MEDICARE PART A AND B / Product Type: *No Product type* /    Admitting Diagnosis: LT ICH  Admit Date/Time:  05/06/2013  6:42 PM Admission Comments: No comment available   Primary Diagnosis:  <principal problem not specified> Principal Problem: <principal problem not specified>  Patient Active Problem List   Diagnosis Date Noted  . probable undiagnosed OSA on CPAP 05/06/2013  . Other and unspecified hyperlipidemia 05/06/2013  . Hypokalemia 05/06/2013  . Chews tobacco 05/06/2013  . Dysphagia, secondary to stroke 05/06/2013  . Malignant hypertension 05/02/2013  . Acute respiratory distress 05/02/2013  . Tobacco abuse 05/02/2013  . 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 Claudette LawsAndrew Kirsteins Social Worker Present: Dossie DerBecky Trace Cederberg, LCSW Nurse Present: Other (comment) Cala Bradford(Kimberly Cook-RN) PT Present: Edman CircleAudra Hall, PT;Emily Marya AmslerParcell, PT OT Present: Rosalio LoudSarah Hoxie, OT;Kris Gellert, Heath LarkT;James McGuire, OT SLP Present: Fae PippinMelissa Bowie, SLP PPS Coordinator present : Tora DuckMarie Noel, RN, CRRN     Current Status/Progress Goal Weekly Team Focus  Medical   Acute right calf DVT  Monitor for progression and prevent progression of right DVT  Increase anticoagulant medication, continue to mobilize   Bowel/Bladder   Incontinent of bowel and bladder requires condom cath  Continent of bowel and bladder with max assistance  time tolieting q 2hrs   Swallow/Nutrition/ Hydration   Dys.2 textues and nectar-thick liquids with full supervision   least restrictive PO intake with Max assist; goals downgraded  increase  participation, use of safe swallow strategies    ADL's   Pt has needed total assist for all bathing and dressing this week secondary to lack of active participation.  Total assist for squat pivot transfers to the drop arm commode.  Brunnstrum stage I in the RUE and hand.  Downgraded to max to total assist  selfcare retraining, sitting and standing balance, pt/family education   Mobility   Pt has regressed in past two days and again is requiring +2 assist for bed mobility, transfers and standing activities.  Continues to demonstrate increased pushing to the R and demonstrates resistance to therapy sessions (more so in past two days)  max assist transfers and mod assist w/c level  functional transfers, NMR for RUE/LE, dynamic sitting/standing balance, addressing pusher tendencies, standing balance, pregait   Communication   Max-Total assist multi-modal cuing   Mod-Max assist with multimodal communication; goals downgraded  increase participation, increase volume, increase self-monitoring and correcting    Safety/Cognition/ Behavioral Observations  Max-Total assist   Max assist; goals downgraded  increase participation, awareness with focus on communication   Pain   Patient complains of pain in right leg manage with vicodin 1 tab daily  pain manage at or below 3 with min assistance  Assess pain level q shift and prn    Skin   Sacrum red, blanchabl  Skin free of breakdown/infection   Assess skin q shift and prn, Assist patient with turning       *See Care Plan and progress notes for long and short-term goals.  Barriers to Discharge: Needs followup Doppler prior to discharge    Possible Resolutions to  Barriers:  Extend stay then discharge to SNF    Discharge Planning/Teaching Needs:  NH bed but now DVT and needs to stay to have re-check on 1/26.  Sister wants him to stya here 30 days before going to NH      Team Discussion:  Pt regressed last two days-now DVT causing the pain.   Inconsistent with participation in therapies.Plan to QD him  Revisions to Treatment Plan:  Pt has DVT follow up 1/26, not medically ready for transfer to NH-QD in therapies   Continued Need for Acute Rehabilitation Level of Care: The patient requires daily medical management by a physician with specialized training in physical medicine and rehabilitation for the following conditions: Daily direction of a multidisciplinary physical rehabilitation program to ensure safe treatment while eliciting the highest outcome that is of practical value to the patient.: Yes Daily medical management of patient stability for increased activity during participation in an intensive rehabilitation regime.: Yes Daily analysis of laboratory values and/or radiology reports with any subsequent need for medication adjustment of medical intervention for : Neurological problems;Other  [name], [name] 05/25/2013, 2:17 PM

## 2013-05-25 NOTE — Progress Notes (Signed)
Physical Therapy Session Note  Patient Details  Name: [name] MRN: [id number] Date of Birth: 17-Mar-1951  Today's Date: 05/25/2013 Time: 9024-0973 Time Calculation (min): 30 min  Short Term Goals: Week 2:  PT Short Term Goal 1 (Week 2): Patient will be able to perform bed mobility with mod assist. PT Short Term Goal 1 - Progress (Week 2): Progressing toward goal PT Short Term Goal 2 (Week 2): Patient will be able to perform transfers with Max-assist of one person PT Short Term Goal 2 - Progress (Week 2): Progressing toward goal (not consistently) PT Short Term Goal 3 (Week 2): Patient will be able to perform static sitting balance x 3-5 mins with min assist. PT Short Term Goal 3 - Progress (Week 2): Met PT Short Term Goal 4 (Week 2): Pt will perform dynamic sitting balance at max assist level consistently.  PT Short Term Goal 4 - Progress (Week 2): Progressing toward goal PT Short Term Goal 5 (Week 2): Patient will be able to propel w/c 5' with Mod-assist. PT Short Term Goal 5 - Progress (Week 2): Progressing toward goal  Skilled Therapeutic Interventions/Progress Updates:   Pt received lying in bed.  Provided max encouragement for pt to sit at EOB to perform oral care this afternoon.  Reluctantly agreed, however requires total assist to get into sitting position at EOB.  Once at EOB, provided stand by assist to max assist for static sitting balance with LUE support and without support in order to perform oral hygiene.  Pt unwilling to perform on his own, therefore this therapist assisting with brushing teeth and tongue.  Pt then with posterior LOB onto pillows propped behind him.  Also noted that pt incontinent of urine.  Attempted to have pt stand with help of nursing tech to stand, however pt refused to participate.  Assisted pt back to bed with +2 assist and performing rolling R and L with total assist in order to perform peri care and don new brief.  Pt left in bed with bed alarm set  and all needs in reach.    Therapy Documentation Precautions:  Precautions Precautions: Fall Precaution Comments: R sided inattention,Dense Right hemiparesis,  pusher toward Right, global aphasia deficits Restrictions Weight Bearing Restrictions: No General: Amount of Missed PT Time (min): 30 Minutes Missed Time Reason: Patient unwilling/refused to participate without medical reason Vital Signs: Therapy Vitals Temp: 98.4 F (36.9 C) Temp src: Oral Pulse Rate: 89 Resp: 18 BP: 137/73 mmHg Patient Position, if appropriate: Lying Oxygen Therapy SpO2: 93 % Pain: Pt grimacing in pain with light touch to L hand during mobility.      See FIM for current functional status  Therapy/Group: Individual Therapy  Denice Bors 05/25/2013, 4:45 PM

## 2013-05-25 NOTE — Progress Notes (Signed)
Occupational Therapy Weekly Progress Note  Patient Details  Name: [name] MRN: [id number] Date of Birth: 1951-01-13  Today's Date: 05/25/2013 Time: 7425-9563 Time Calculation (min): 44 min  Patient has met 0 of 5 short term goals.  [name] has demonstrated slight regression with his progress in OT sessions.  Currently he needs total assist for sitting balance EOB during bathing tasks.  Still with significant pushing to the right side in both sitting and standing.  He usually tends to get agitated and attempt to refuse all therapies.  Requires constant max coaxing to participate and feel this is likely one of the main issues which has affected his progress.  His expressive and receptive difficulties have also contributed to his increased frustration.  He does not attempt any positioning of the RUE and has been issued a resting hand splint for positioning as he grimaces when the hand is moved at times.  He also continues to have pain in his right ankle and right knee.  Overall needs total assist for squat pivot transfers to drop arm commode with total assist +2 for sit to stand to manage clothing and perform hygiene.  Pt is so resistant to actively participating in OT sessions that feel he needs to be QD and will need transition to SNF for further therapies.  Goals have been downgraded.    Patient continues to demonstrate the following deficits: decreased balance, decreased initiation, decreased awareness, decreased RUE and RLE strength, decreased coordination, receptive and expressive difficulties and therefore will continue to benefit from skilled OT intervention to enhance overall performance with BADL.  Patient not progressing toward long term goals.  See goal revision..  Plan of care revisions: Pt with plans to discharge to SNF.  OT Short Term Goals Week 3:  OT Short Term Goal 1 (Week 3): Pt will transfer supine to sit EOB with max assist. OT Short Term Goal 2 (Week 3): Pt will perform UB  bathing in supported sitting with mod assist for 2 consecutive sessions with no more than mod demonstrational cueing. OT Short Term Goal 3 (Week 3): Pt will sit EOB statically for 2 mins with min assist during selfcare tasks. OT Short Term Goal 4 (Week 3): Pt will tolerate hand over hand assist to intgrate the RUE into bathing task. OT Short Term Goal 5 (Week 3): Pt will perform toilet transfer with max assist squat pivot to drop arm commode.  Skilled Therapeutic Interventions/Progress Updates:    Pt worked on bathing and dressing sitting EOB this session.  Total assist to agree to participate with total assist to transition from supine to sit EOB.  Pt needing total assist to maintain sitting balance with increased falling to the right.  Even when given a second person placed on his left side to have him shift toward, he cannot motor plan how to stop from falling to the right side.  Pt needing max demonstrational cueing to attempt washing his face secondary to receptive difficulties and behavioral issues.  Only assisted with washing his face, chest, abdomen, and upper legs, all with decreased thoroughness.  Needed total assist to donn brief over his feet as well as pants.  Total assist +2(pt 30%) for sit to stand during LB bathing and dressing.  Pt returned to supine after completion of dressing.    Therapy Documentation Precautions:  Precautions Precautions: Fall Precaution Comments: R sided inattention,Dense Right hemiparesis,  pusher toward Right, global aphasia deficits Restrictions Weight Bearing Restrictions: No  Pain: Pain Assessment  Pain Assessment: Faces Pain Score: 0-No pain Faces Pain Scale: Hurts little more Pain Type: Acute pain Pain Location: Ankle Pain Intervention(s): Repositioned Multiple Pain Sites: Yes 2nd Pain Site Pain Score: 7 Pain Location: Wrist Pain Orientation: Right ADL: See FIM for current functional status  Therapy/Group: Individual  Therapy  MCGUIRE,JAMES xx 05/25/2013, 11:37 AM   

## 2013-05-25 NOTE — Progress Notes (Signed)
Physical Therapy Note  Patient Details  Name: Linna Capricedward A Medaglia MRN: [id number] Date of Birth: 08/02/1950 Today's Date: 05/25/2013  Pt missed 60 mins of PT time this morning (4098-1191(1015-1115) due to pt adamantly refusing to participate.     Vista Deckarcell, Alenna Russell Ann 05/25/2013, 5:03 PM

## 2013-05-25 NOTE — Progress Notes (Signed)
Subjective/Complaints: Off O2 comfortable in Bed, no SOB or resp distress noted Review of Systems - not able to obtain secondary to aphasia Objective: Vital Signs: Blood pressure 130/70, pulse 81, temperature 97.7 F (36.5 C), temperature source Axillary, resp. rate 18, height 6\' 5"  (1.956 m), weight 84.278 kg (185 lb 12.8 oz), SpO2 92.00%. Ct Head Wo Contrast  05/24/2013   CLINICAL DATA:  Followup intracranial hemorrhage  EXAM: CT HEAD WITHOUT CONTRAST  TECHNIQUE: Contiguous axial images were obtained from the base of the skull through the vertex without intravenous contrast.  COMPARISON:  CT 05/03/2013  FINDINGS: Hemorrhage in the left external capsule continues to improve. The high-density hemorrhage measures 22 mm and is contracting. There is low-density surrounding the hematoma. No intraventricular hemorrhage.  Negative for hydrocephalus. There is generalized atrophy. Mild midline shift to the right measures approximately 3 mm. Chronic microvascular ischemic changes in the white matter. No acute infarct.  Mucosal thickening in the paranasal sinuses.  IMPRESSION: Improving hemorrhage in the left external capsule with mild mass effect. No new hemorrhage or infarct since the prior CT.   Electronically Signed   By: Marlan Palau M.D.   On: 05/24/2013 19:53   Dg Ankle Right Port  05/24/2013   CLINICAL DATA:  Right ankle pain after injury.  EXAM: PORTABLE RIGHT ANKLE - 2 VIEW  COMPARISON:  None.  FINDINGS: Imaged bones, joints and soft tissues appear normal.  IMPRESSION: Negative exam.   Electronically Signed   By: Drusilla Kanner M.D.   On: 05/24/2013 09:35   No results found for this or any previous visit (from the past 72 hour(s)).    Constitutional: He appears well-developed and well-nourished.  HENT:  Head: Normocephalic and atraumatic.  Eyes: Conjunctivae are normal. Pupils are equal, round, and reactive to light.  Neck: Normal range of motion. Neck supple.  Cardiovascular: Regular  rhythm. Tachycardia present.  Respiratory: Effort normal and breath sounds normal. No respiratory distress. He has no wheezes.   GI: Soft. Bowel sounds are normal. He exhibits no distension. There is no tenderness.  Neurological: He is alert.   Flat affect with delayed processing. Restless and distracted. Minimal verbal output. Dense right hemiparesis with sensory deficits. Moves LUE/LLE with visual/verbal cues. Unable to follow simple commands. RUE and RLE are 0/5. Marland Kitchen Able to only state name, moderate dysarthria Skin: Skin is warm and dry  MSK + Homan's sign   Assessment/Plan: 1. 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 Physiatrist is providing close team supervision and 24 hour management of active medical problems listed below. Physiatrist and rehab team continue to assess barriers to discharge/monitor patient progress toward functional and medical goals.  FIM: FIM - Bathing Bathing Steps Patient Completed: Left upper leg Bathing: 1: Two helpers  FIM - Upper Body Dressing/Undressing Upper body dressing/undressing steps patient completed: Thread/unthread left sleeve of pullover shirt/dress Upper body dressing/undressing: 2: Max-Patient completed 25-49% of tasks FIM - Lower Body Dressing/Undressing Lower body dressing/undressing steps patient completed: Thread/unthread left pants leg Lower body dressing/undressing: 1: Two helpers  FIM - Toileting Toileting: 1: Two helpers  FIM - Archivist Transfers: 1-Two helpers  FIM - Banker Devices: Arm rests Bed/Chair Transfer: 1: Two helpers  FIM - Locomotion: Wheelchair Distance: 100 Locomotion: Wheelchair: 2: Travels 50 - 149 ft with maximal assistance (Pt: 25 - 49%) FIM - Locomotion: Ambulation Locomotion:  Ambulation Assistive  Devices: MaxiSky Ambulation/Gait Assistance: 1: +2 Total assist Locomotion: Ambulation: 0: Activity did not occur  Comprehension Comprehension Mode: Auditory Comprehension: 2-Understands basic 25 - 49% of the time/requires cueing 51 - 75% of the time  Expression Expression Mode: Verbal Expression: 2-Expresses basic 25 - 49% of the time/requires cueing 50 - 75% of the time. Uses single words/gestures.  Social Interaction Social Interaction: 2-Interacts appropriately 25 - 49% of time - Needs frequent redirection.  Problem Solving Problem Solving: 1-Solves basic less than 25% of the time - needs direction nearly all the time or does not effectively solve problems and may need a restraint for safety  Memory Memory: 1-Recognizes or recalls less than 25% of the time/requires cueing greater than 75% of the time  Medical Problem List and Plan:  1. 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  Doppler no carotid stenosis 2. DVT Prophylaxis/Anticoagulation:Right peroneal DVT Subcutaneous Lovenox initiated 05/05/2013. Monitor platelet counts has calf DVT needs repeat doppler in 5-7 days, have increased lovenox to 30mg   BID 3. Pain Management: Tylenol as needed , R ankle pain ,R calf DVT and R ankle xray- 4. Neuropsych: This patient is not capable of making decisions on his own behalf.  5. Dysphagia. Dysphagia 1 honey thick liquids. Monitor closely for any aspiration. Followup speech therapy  6. Hypertension.Uncontrolled with tachycardia Norvasc 10 mg daily, hydrochlorothiazide 25 mg daily. Monitor with increased mobility, titrate BB 7. OSA. CPAP. Will follow pulmonary services as needed        LOS (Days) 19 A FACE TO FACE EVALUATION WAS PERFORMED  Akhil Piscopo E 05/25/2013, 7:34 AM

## 2013-05-25 NOTE — Progress Notes (Signed)
Social Work Patient ID: Linna CapriceEdward A Dubois, male   DOB: 1950/07/04, 63 y.o.   MRN: [id number] Spoke with Sandy-sister to inform of team conference progress and medical issues-DVT.  Informed would do another follow up doppler on 1/26 and from there discuss  Options and medical stability regarding transfer to NH.  She plans to be here Sunday and will discuss with her on Monday.  She requested to speak with PA or MD and have Left messages for both to contact her.

## 2013-05-25 NOTE — Progress Notes (Signed)
Speech Language Pathology Daily Session Note  Patient Details  Name: [name] A Mckell MRN: [id number] Date of Birth: 02/06/1951  Today's Date: 05/25/2013 Time: [id number] Time Calculation (min): 12 min  Short Term Goals: Week 3: SLP Short Term Goal 1 (Week 3): Pt will utilize safe swallowing strategies with Dys 2 textures and nectar-thick liquids with Mod cues  SLP Short Term Goal 2 (Week 3): Pt will consume trials of thin liquids with small sips and minimal overt s/s of aspiration with Max cues SLP Short Term Goal 3 (Week 3): Pt will sustain attention to basic functional task for 2 minutes with Max cues SLP Short Term Goal 4 (Week 3): Pt will communicate basic wants/needs via multimodal communication with Mod cues SLP Short Term Goal 5 (Week 3): Pt will follow one-step commands with Max cues SLP Short Term Goal 6 (Week 3): Pt will receptively identify common objects from a field of four with Supervision cues  Skilled Therapeutic Interventions: Skilled treatment session focused on addressing cognitive-linguistic goals; however, patient declined participation by closing eyes and turning away from therapist.  Max cues for encouragement to participate were not effective.  SLP facilitated session with Total assist to perform oral care via suctioning; patient demonstrated some biting today, but verbal cues were effective to re-direct during task.  SLP also attempted to assess needs during session however, patient required Total assist for yes/no responses.  Session ended early today; continue with current plan of care.   FIM:  Comprehension Comprehension Mode: Auditory Comprehension: 1-Understands basic less than 25% of the time/requires cueing 75% of the time Expression Expression Mode: Verbal Expression: 1-Expresses basis less than 25% of the time/requires cueing greater than 75% of the time. Social Interaction Social Interaction: 1-Interacts appropriately less than 25% of the time. May be  withdrawn or combative. Problem Solving Problem Solving: 1-Solves basic less than 25% of the time - needs direction nearly all the time or does not effectively solve problems and may need a restraint for safety Memory Memory: 1-Recognizes or recalls less than 25% of the time/requires cueing greater than 75% of the time  Pain Pain Assessment Pain Assessment: Faces Faces Pain Scale: Hurts little more Pain Type: Acute pain Pain Location: Ankle Pain Intervention(s): Repositioned Multiple Pain Sites: Yes 2nd Pain Site Pain Score: 7 Pain Location: Wrist Pain Orientation: Right  Therapy/Group: Individual Therapy  Charlane FerrettiMelissa Amra Shukla, M.A., CCC-SLP [phone]  Trichelle Lehan 05/25/2013, 1:59 PM

## 2013-05-25 NOTE — Progress Notes (Signed)
Social Work Patient ID: Linna CapriceEdward A Machorro, male   DOB: 07/14/1950, 63 y.o.   MRN: [id number] Spoke with MD regarding pt's DVT, not medically ready fro transfer.  Re-check dopplers on 1/26.  Will inform [facility] of information and contact sister, To inform if doesn't know already.

## 2013-05-26 ENCOUNTER — Inpatient Hospital Stay (HOSPITAL_COMMUNITY): Payer: Medicare Other | Admitting: Speech Pathology

## 2013-05-26 ENCOUNTER — Encounter (HOSPITAL_COMMUNITY): Payer: Medicare Other | Admitting: Occupational Therapy

## 2013-05-26 ENCOUNTER — Inpatient Hospital Stay (HOSPITAL_COMMUNITY): Payer: Medicare Other | Admitting: Rehabilitation

## 2013-05-26 NOTE — Progress Notes (Signed)
NUTRITION FOLLOW UP  Intervention:   - Continue Ensure pudding TID - Recommend MD discuss enteral nutrition as pt with minimal intake in the past 7 days due to pt refusing to eat - RD to continue to monitor   NUTRITION DIAGNOSIS:  Inadequate oral intake related to poor appetite as evidenced by refusing meals, PO 0-5% - ongoing   Monitor:  1. Food/Beverage; pt meeting >/=90% estimated needs with tolerance - not met  2. Wt/wt change; monitor trends - monitoring, up 1 pound since admission likely from fluid  Assessment:   Pt admitted with functional deficits r/t to left MCA infarct, R hemiplegia, aphasia, and dysphagia.  1/15 - Pt sleeping soundly at time of visit.  Pt with minimal improvement in his PO intake since last assessment.  Largely refusing meals due to frustration and MS. PO 0-15% of meals.  Starting Megace today.  Remains aphasic.  Pursuing SNF placement.  1/22 - Pt was incontinent of stool x 2 on 1/18 requiring disimpaction. Pt refused today and refused to eat on 1/20 as well. Most significant intake recently was 25% of lunch yesterday. Pt asleep during visit. RN has been encouraging increased meal and supplement intake and mixes medications in Ensure pudding however after pt consumes medications, he refuses to eat any more pudding. Asked for water today which RN provided to pt after she thickened it to nectar thick and pt only consumed a sip and then started coughing. Per RN, pt often puts his hand in the air to say "no" when he is encouraged to eat.   Height: Ht Readings from Last 1 Encounters:  05/06/13 _0  (1.956 m)    Weight Status:   Wt Readings from Last 1 Encounters:  05/13/13 185 lb 12.8 oz (84.278 kg)  Admit wt:        184 lb (83.4 kg) Net I/Os: +1.7L  Re-estimated needs:  Kcal: 2000-2200  Protein: 105-125g  Fluid: >2.0 L/day  Skin: intact  Diet Order: Dysphagia 2, nectar-thick   Intake/Output Summary (Last 24 hours) at 05/26/13 1511 Last data filed  at 05/26/13 0800  Gross per 24 hour  Intake    200 ml  Output    700 ml  Net   -500 ml    Last BM: 1/18   Labs:   Recent Labs Lab 05/20/13 0532 05/25/13 0619  NA  --  141  K  --  4.0  CL  --  103  CO2  --  22  BUN  --  35*  CREATININE 1.06 1.28  CALCIUM  --  9.5  GLUCOSE  --  99    CBG (last 3)  No results found for this basename: GLUCAP,  in the last 72 hours  Scheduled Meds: . ALPRAZolam  0.25 mg Oral TID  . amLODipine  10 mg Oral Daily  . antiseptic oral rinse  15 mL Mouth Rinse q12n4p  . chlorhexidine  15 mL Mouth Rinse BID  . enoxaparin (LOVENOX) injection  30 mg Subcutaneous Q12H  . feeding supplement (ENSURE)  1 Container Oral TID AC & HS  . hydrochlorothiazide  25 mg Oral Daily  . labetalol  300 mg Oral BID  . megestrol  400 mg Oral BID  . mirtazapine  30 mg Oral QHS  . nystatin  5 mL Oral QID  . pantoprazole sodium  40 mg Per Tube Daily  . senna-docusate  1 tablet Oral BID    Continuous Infusions: . sodium chloride 75 mL/hr at 05/25/13  [place], [place], [place] Pager [phone] After Hours Pager

## 2013-05-26 NOTE — Progress Notes (Signed)
Physical Therapy Session Note  Patient Details  Name: [name] MRN: [id number] Date of Birth: 1950/06/14  Today's Date: 05/26/2013 Time: 1102-1158 Time Calculation (min): 56 min  Short Term Goals: Week 3:  PT Short Term Goal 1 (Week 3): =LTG's due to expected LOS  Skilled Therapeutic Interventions/Progress Updates:    Pt received sitting at nursing station, receiving meds from RN.  Assisted pt down to therapy gym and performed squat pivot transfer w/c <> mat at total assist with max verbal and visual cues for forward trunk lean.  Continue to perform bobath technique to increase forward weight shift and lean.  Once sitting on EOM, performing reaching activity with cones to the L and tossing them to the R with use of mirror for increased visual feedback.  Pt requires max to total assist at times due to increased pushing to the R.  Pt continues to resist help from therapist, stating "Let me go" despite max cues for education on safety and why therapist cannot let pt go.  Have tested pt reactive balance strategies, however he is unable to self correct and demonstrates increased tremors when assisting back to the L.  Performed several reps of propping on L elbow then into L SL to supine all at total assist.  Performed 10 reps of bridging using LLE only (attempted to flex RLE, however unable to do so due to pain.  Rolled back into L SL at total assist then into sitting position with max assist.  Pt able to self assist using LUE, however requires max verbal and tactile cues.  Pt returned to chair as mentioned above and left in w/c with quick release belt and half lap tray in place and taken to nursing station for increased safety.    Therapy Documentation Precautions:  Precautions Precautions: Fall Precaution Comments: R sided inattention,Dense Right hemiparesis,  pusher toward Right, global aphasia deficits Restrictions Weight Bearing Restrictions: No   Pain: Pain Assessment Pain Assessment:  0-10 Faces Pain Scale: Hurts little more Pain Type: Acute pain Pain Location: Ankle Pain Orientation: Right Pain Onset: With Activity Patients Stated Pain Goal: 3 Pain Intervention(s): Repositioned    See FIM for current functional status  Therapy/Group: Individual Therapy  Vista Deckarcell, Stepheny Canal Ann 05/26/2013, 12:58 PM

## 2013-05-26 NOTE — Progress Notes (Signed)
Speech Language Pathology Daily Session Note  Patient Details  Name: [name] MRN: [id number] Date of Birth: [birth year]  Today's Date: 05/26/2013 Time: 1005-1030 Time Calculation (min): 25 min  Short Term Goals: Week 3: SLP Short Term Goal 1 (Week 3): Pt will utilize safe swallowing strategies with Dys 2 textures and nectar-thick liquids with Mod cues  SLP Short Term Goal 2 (Week 3): Pt will consume trials of thin liquids with small sips and minimal overt s/s of aspiration with Max cues SLP Short Term Goal 3 (Week 3): Pt will sustain attention to basic functional task for 2 minutes with Max cues SLP Short Term Goal 4 (Week 3): Pt will communicate basic wants/needs via multimodal communication with Mod cues SLP Short Term Goal 5 (Week 3): Pt will follow one-step commands with Max cues SLP Short Term Goal 6 (Week 3): Pt will receptively identify common objects from a field of four with Supervision cues  Skilled Therapeutic Interventions: Skilled treatment session focused on addressing cognitive-linguistic goals.  Patient requested oral care and SLP facilitated session with set up assist and Min verbal/visual cues to thoroughly perform oral care.  SLP also facilitated session with sentence completion task with picture and initial letter of word, patient required Max cues to accurately complete. Continue with current plan of care.   FIM:  Comprehension Comprehension Mode: Auditory Comprehension: 2-Understands basic 25 - 49% of the time/requires cueing 51 - 75% of the time Expression Expression Mode: Verbal Expression: 2-Expresses basic 25 - 49% of the time/requires cueing 50 - 75% of the time. Uses single words/gestures. Social Interaction Social Interaction: 2-Interacts appropriately 25 - 49% of time - Needs frequent redirection. Problem Solving Problem Solving: 1-Solves basic less than 25% of the time - needs direction nearly all the time or does not effectively solve problems and  may need a restraint for safety Memory Memory: 1-Recognizes or recalls less than 25% of the time/requires cueing greater than 75% of the time  Pain Pain Assessment Pain Assessment: No/denies pain  Therapy/Group: Individual Therapy  Charlane FerrettiMelissa Jermarion Poffenberger, M.A., CCC-SLP [phone]  [name] 05/26/2013, 4:34 PM

## 2013-05-26 NOTE — Progress Notes (Signed)
Subjective/Complaints: Off O2 comfortable in Bed, no SOB or resp distress noted Review of Systems - not able to obtain secondary to aphasia Objective: Vital Signs: Blood pressure 123/76, pulse 74, temperature 98.4 F (36.9 C), temperature source Oral, resp. rate 18, height $RemoveBe'6\' 5"'TFrpXlVgB$  (1.956 m), weight 84.278 kg (185 lb 12.8 oz), SpO2 93.00%. Ct Head Wo Contrast  05/24/2013   CLINICAL DATA:  Followup intracranial hemorrhage  EXAM: CT HEAD WITHOUT CONTRAST  TECHNIQUE: Contiguous axial images were obtained from the base of the skull through the vertex without intravenous contrast.  COMPARISON:  CT 05/03/2013  FINDINGS: Hemorrhage in the left external capsule continues to improve. The high-density hemorrhage measures 22 mm and is contracting. There is low-density surrounding the hematoma. No intraventricular hemorrhage.  Negative for hydrocephalus. There is generalized atrophy. Mild midline shift to the right measures approximately 3 mm. Chronic microvascular ischemic changes in the white matter. No acute infarct.  Mucosal thickening in the paranasal sinuses.  IMPRESSION: Improving hemorrhage in the left external capsule with mild mass effect. No new hemorrhage or infarct since the prior CT.   Electronically Signed   By: Franchot Gallo M.D.   On: 05/24/2013 19:53   Dg Ankle Right Port  05/24/2013   CLINICAL DATA:  Right ankle pain after injury.  EXAM: PORTABLE RIGHT ANKLE - 2 VIEW  COMPARISON:  None.  FINDINGS: Imaged bones, joints and soft tissues appear normal.  IMPRESSION: Negative exam.   Electronically Signed   By: Inge Rise M.D.   On: 05/24/2013 09:35   Results for orders placed during the hospital encounter of 05/06/13 (from the past 72 hour(s))  BASIC METABOLIC PANEL     Status: Abnormal   Collection Time    05/25/13  6:19 AM      Result Value Range   Sodium 141  137 - 147 mEq/L   Potassium 4.0  3.7 - 5.3 mEq/L   Chloride 103  96 - 112 mEq/L   CO2 22  19 - 32 mEq/L   Glucose, Bld 99  70  - 99 mg/dL   BUN 35 (*) 6 - 23 mg/dL   Creatinine, Ser 1.28  0.50 - 1.35 mg/dL   Calcium 9.5  8.4 - 10.5 mg/dL   GFR calc non Af Amer 58 (*) >90 mL/min   GFR calc Af Amer 68 (*) >90 mL/min   Comment: (NOTE)     The eGFR has been calculated using the CKD EPI equation.     This calculation has not been validated in all clinical situations.     eGFR's persistently <90 mL/min signify possible Chronic Kidney     Disease.      Constitutional: He appears well-developed and well-nourished.  HENT:  Head: Normocephalic and atraumatic.  Eyes: Conjunctivae are normal. Pupils are equal, round, and reactive to light.  Neck: Normal range of motion. Neck supple.  Cardiovascular: Regular rhythm. Tachycardia present.  Respiratory: Effort normal and breath sounds normal. No respiratory distress. He has no wheezes.   GI: Soft. Bowel sounds are normal. He exhibits no distension. There is no tenderness.  Neurological: He is alert.   Flat affect with delayed processing. Restless and distracted. Minimal verbal output. Dense right hemiparesis with sensory deficits. Moves LUE/LLE with visual/verbal cues. Unable to follow simple commands. RUE and RLE are 0/5. Marland Kitchen Able to only state name, moderate dysarthria Skin: Skin is warm and dry  MSK + Homan's sign, mild pain with ankle inversion/eversion   Assessment/Plan: 1. Functional deficits secondary  to Left MCA infarct, R flaccid hemiplegia, aphasia, dysphagia which require 3+ hours per day of interdisciplinary therapy in a comprehensive inpatient rehab setting. Physiatrist is providing close team supervision and 24 hour management of active medical problems listed below. Physiatrist and rehab team continue to assess barriers to discharge/monitor patient progress toward functional and medical goals.  FIM: FIM - Bathing Bathing Steps Patient Completed: Chest;Abdomen;Right upper leg;Left upper leg Bathing: 1: Two helpers  FIM - Upper Body  Dressing/Undressing Upper body dressing/undressing steps patient completed: Thread/unthread left sleeve of pullover shirt/dress Upper body dressing/undressing: 0: Wears gown/pajamas-no public clothing FIM - Lower Body Dressing/Undressing Lower body dressing/undressing steps patient completed: Thread/unthread left pants leg Lower body dressing/undressing: 1: Two helpers  FIM - Toileting Toileting: 1: Two helpers  FIM - Air cabin crew Transfers: 1-Two helpers  FIM - Control and instrumentation engineer Devices: Arm rests Bed/Chair Transfer: 1: Supine > Sit: Total A (helper does all/Pt. < 25%);1: Two helpers  FIM - Locomotion: Wheelchair Distance: 100 Locomotion: Wheelchair: 2: Travels 48 - 149 ft with maximal assistance (Pt: 25 - 49%) FIM - Locomotion: Ambulation Locomotion: Ambulation Assistive Devices: MaxiSky Ambulation/Gait Assistance: 1: +2 Total assist Locomotion: Ambulation: 0: Activity did not occur  Comprehension Comprehension Mode: Auditory Comprehension: 1-Understands basic less than 25% of the time/requires cueing 75% of the time  Expression Expression Mode: Verbal Expression: 1-Expresses basis less than 25% of the time/requires cueing greater than 75% of the time.  Social Interaction Social Interaction: 1-Interacts appropriately less than 25% of the time. May be withdrawn or combative.  Problem Solving Problem Solving: 1-Solves basic less than 25% of the time - needs direction nearly all the time or does not effectively solve problems and may need a restraint for safety  Memory Memory: 1-Recognizes or recalls less than 25% of the time/requires cueing greater than 75% of the time  Medical Problem List and Plan:  1. 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  Doppler no carotid stenosis 2. DVT Prophylaxis/Anticoagulation:Right peroneal DVT Subcutaneous Lovenox initiated 05/05/2013. Monitor platelet counts  has calf DVT needs repeat doppler in 5-7 days, have increased lovenox to $RemoveBe'30mg'eqaPTUxDd$   BID 3. Pain Management: Tylenol as needed , R ankle pain ,R calf DVT and R ankle xray- 4. Neuropsych: This patient is not capable of making decisions on his own behalf.  5. Dysphagia. Dysphagia 1 honey thick liquids. Monitor closely for any aspiration. Followup speech therapy  6. Hypertension.Uncontrolled with tachycardia Norvasc 10 mg daily, hydrochlorothiazide 25 mg daily. Monitor with increased mobility, titrate BB 7. OSA. CPAP. Will follow pulmonary services as needed        LOS (Days) 20 A FACE TO FACE EVALUATION WAS PERFORMED  Ercole Georg E 05/26/2013, 6:26 AM

## 2013-05-26 NOTE — Progress Notes (Signed)
Occupational Therapy Session Note  Patient Details  Name: [name] MRN: [id number] Date of Birth: [birth year]  Today's Date: 05/26/2013 Time: 4098-11910904-1005 Time Calculation (min): 61 min  Short Term Goals: Week 3:  OT Short Term Goal 1 (Week 3): Pt will transfer supine to sit EOB with max assist. OT Short Term Goal 2 (Week 3): Pt will perform UB bathing in supported sitting with mod assist for 2 consecutive sessions with no more than mod demonstrational cueing. OT Short Term Goal 3 (Week 3): Pt will sit EOB statically for 2 mins with min assist during selfcare tasks. OT Short Term Goal 4 (Week 3): Pt will tolerate hand over hand assist to intgrate the RUE into bathing task. OT Short Term Goal 5 (Week 3): Pt will perform toilet transfer with max assist squat pivot to drop arm commode.  Skilled Therapeutic Interventions/Progress Updates:    Pt found incontinent of bowel in the bed to start session.  Performed rolling side to side with max assist to the right and total assist to the left.  Pt sat EOB with max assist while engaged in bathing task.  Pt not quite as resistive to participating in therapy this session but still needed max demonstrational cueing to initiate any bathing or dressing tasks.  He was able to wash his face, abdomen, chest, and upper legs but therapist had to assist with all other parts.  Still with limiting pain in the right hand and ankle, with pt grimacing with hand over hand assist to wash the LUE.  Overall still pushing heavily to the right side in sitting.  Total assist + 2 for sit to stand and standing balance as therapist pulled pants over hips.  Transferred to the wheelchair with total assist as well Bobath technique.    Therapy Documentation Precautions:  Precautions Precautions: Fall Precaution Comments: R sided inattention,Dense Right hemiparesis,  pusher toward Right, global aphasia deficits Restrictions Weight Bearing Restrictions: No  Pain: Pain  Assessment Faces Pain Scale: Hurts little more Pain Type: Acute pain Pain Location: Ankle Pain Orientation: Right Pain Intervention(s): Repositioned  ADL: See FIM for current functional status  Therapy/Group: Individual Therapy  Alicha Raspberry OTR/L  05/26/2013, 12:23 PM

## 2013-05-27 ENCOUNTER — Inpatient Hospital Stay (HOSPITAL_COMMUNITY): Payer: Medicare Other | Admitting: Physical Therapy

## 2013-05-27 ENCOUNTER — Encounter (HOSPITAL_COMMUNITY): Payer: Medicare Other | Admitting: Occupational Therapy

## 2013-05-27 ENCOUNTER — Inpatient Hospital Stay (HOSPITAL_COMMUNITY): Payer: Medicare Other | Admitting: Speech Pathology

## 2013-05-27 DIAGNOSIS — I69920 Aphasia following unspecified cerebrovascular disease: Secondary | ICD-10-CM

## 2013-05-27 DIAGNOSIS — G8110 Spastic hemiplegia affecting unspecified side: Secondary | ICD-10-CM

## 2013-05-27 DIAGNOSIS — M109 Gout, unspecified: Secondary | ICD-10-CM

## 2013-05-27 DIAGNOSIS — I619 Nontraumatic intracerebral hemorrhage, unspecified: Secondary | ICD-10-CM

## 2013-05-27 LAB — CREATININE, SERUM
Creatinine, Ser: 1.18 mg/dL (ref 0.50–1.35)
GFR calc non Af Amer: 64 mL/min — ABNORMAL LOW (ref >90)
GFR, EST AFRICAN AMERICAN: 75 mL/min — AB (ref >90)

## 2013-05-27 MED ORDER — PANTOPRAZOLE SODIUM 40 MG PO TBEC
40.0000 mg | DELAYED_RELEASE_TABLET | Freq: Every day | ORAL | Status: DC
  Administered 2013-05-27 – 2013-06-06 (×11): 40 mg via ORAL
  Filled 2013-05-27 (×10): qty 1

## 2013-05-27 NOTE — Progress Notes (Signed)
Subjective/Complaints: Some RLE discomfort with movement noted by RN Review of Systems - not able to obtain secondary to aphasia Objective: Vital Signs: Blood pressure 125/72, pulse 75, temperature 98 F (36.7 C), temperature source Oral, resp. rate 18, height _0  (1.956 m), weight 84.278 kg (185 lb 12.8 oz), SpO2 96.00%. No results found. Results for orders placed during the hospital encounter of 05/06/13 (from the past 72 hour(s))  BASIC METABOLIC PANEL     Status: Abnormal   Collection Time    05/25/13  6:19 AM      Result Value Range   Sodium 141  137 - 147 mEq/L   Potassium 4.0  3.7 - 5.3 mEq/L   Chloride 103  96 - 112 mEq/L   CO2 22  19 - 32 mEq/L   Glucose, Bld 99  70 - 99 mg/dL   BUN 35 (*) 6 - 23 mg/dL   Creatinine, Ser 1.28  0.50 - 1.35 mg/dL   Calcium 9.5  8.4 - 10.5 mg/dL   GFR calc non Af Amer 58 (*) >90 mL/min   GFR calc Af Amer 68 (*) >90 mL/min   Comment: (NOTE)     The eGFR has been calculated using the CKD EPI equation.     This calculation has not been validated in all clinical situations.     eGFR's persistently <90 mL/min signify possible Chronic Kidney     Disease.      Constitutional: He appears well-developed and well-nourished.  HENT:  Head: Normocephalic and atraumatic.  Eyes: Conjunctivae are normal. Pupils are equal, round, and reactive to light.  Neck: Normal range of motion. Neck supple.  Cardiovascular: Regular rhythm. Tachycardia present.  Respiratory: Effort normal and breath sounds normal. No respiratory distress. He has no wheezes.   GI: Soft. Bowel sounds are normal. He exhibits no distension. There is no tenderness.  Neurological: He is alert.   Flat affect with delayed processing. Restless and distracted. Minimal verbal output. Dense right hemiparesis with sensory deficits. Moves LUE/LLE with visual/verbal cues. Unable to follow simple commands. RUE and RLE are 0/5. Marland Kitchen Able to only state name, moderate dysarthria Skin: Skin is warm and  dry  MSK , mild pain with ankle dorsiflexion   Assessment/Plan: 1. 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 Physiatrist is providing close team supervision and 24 hour management of active medical problems listed below. Physiatrist and rehab team continue to assess barriers to discharge/monitor patient progress toward functional and medical goals.  FIM: FIM - Bathing Bathing Steps Patient Completed: Chest;Abdomen;Right upper leg;Left upper leg Bathing: 1: Two helpers  FIM - Upper Body Dressing/Undressing Upper body dressing/undressing steps patient completed: Thread/unthread left sleeve of pullover shirt/dress Upper body dressing/undressing: 2: Max-Patient completed 25-49% of tasks FIM - Lower Body Dressing/Undressing Lower body dressing/undressing steps patient completed: Thread/unthread left pants leg Lower body dressing/undressing: 1: Two helpers  FIM - Toileting Toileting: 1: Two helpers  FIM - Air cabin crew Transfers: 1-Two helpers  FIM - Control and instrumentation engineer Devices: Arm rests Bed/Chair Transfer: 1: Bed > Chair or W/C: Total A (helper does all/Pt. < 25%);1: Chair or W/C > Bed: Total A (helper does all/Pt. < 25%)  FIM - Locomotion: Wheelchair Distance: 100 Locomotion: Wheelchair: 2: Travels 50 - 149 ft with maximal assistance (Pt: 25 - 49%) FIM - Locomotion: Ambulation Locomotion: Ambulation Assistive Devices: MaxiSky Ambulation/Gait Assistance:  1: +2 Total assist Locomotion: Ambulation: 0: Activity did not occur  Comprehension Comprehension Mode: Auditory Comprehension: 2-Understands basic 25 - 49% of the time/requires cueing 51 - 75% of the time  Expression Expression Mode: Verbal Expression: 2-Expresses basic 25 - 49% of the time/requires cueing 50 - 75% of the time. Uses  single words/gestures.  Social Interaction Social Interaction: 2-Interacts appropriately 25 - 49% of time - Needs frequent redirection.  Problem Solving Problem Solving: 1-Solves basic less than 25% of the time - needs direction nearly all the time or does not effectively solve problems and may need a restraint for safety  Memory Memory: 1-Recognizes or recalls less than 25% of the time/requires cueing greater than 75% of the time  Medical Problem List and Plan:  1. 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  Doppler no carotid stenosis 2. DVT Prophylaxis/Anticoagulation:Right peroneal DVT Subcutaneous Lovenox initiated 05/05/2013. Monitor platelet counts has calf DVT needs repeat doppler 1/26, have increased lovenox to 55m  BID 3. Pain Management: Tylenol as needed , R ankle pain ,R calf DVT and R ankle xray- 4. Neuropsych: This patient is not capable of making decisions on his own behalf.  5. Dysphagia. Dysphagia 1 honey thick liquids. Monitor closely for any aspiration. Followup speech therapy  6. Hypertension.Uncontrolled with tachycardia Norvasc 10 mg daily, hydrochlorothiazide 25 mg daily. Monitor with increased mobility, titrate BB 7. OSA. CPAP. Will follow pulmonary services as needed        LOS (Days) 21 A FACE TO FACE EVALUATION WAS PERFORMED  Corbin Hott E 05/27/2013, 6:42 AM

## 2013-05-27 NOTE — Progress Notes (Signed)
Speech Language Pathology Daily Session Note  Patient Details  Name: [name] MRN: [id number] Date of Birth: [birth year]  Today's Date: 05/27/2013 Time: [id number] Time Calculation (min): 25 min  Short Term Goals: Week 3: SLP Short Term Goal 1 (Week 3): Pt will utilize safe swallowing strategies with Dys 2 textures and nectar-thick liquids with Mod cues  SLP Short Term Goal 2 (Week 3): Pt will consume trials of thin liquids with small sips and minimal overt s/s of aspiration with Max cues SLP Short Term Goal 3 (Week 3): Pt will sustain attention to basic functional task for 2 minutes with Max cues SLP Short Term Goal 4 (Week 3): Pt will communicate basic wants/needs via multimodal communication with Mod cues SLP Short Term Goal 5 (Week 3): Pt will follow one-step commands with Max cues SLP Short Term Goal 6 (Week 3): Pt will receptively identify common objects from a field of four with Supervision cues  Skilled Therapeutic Interventions: Skilled treatment session focused on addressing cognitive-linguistic goals.  Patient participated in structured naming activity with Max encouragement and Mod sentence completion and phenemic verbal and written cues to label pictures.  With increased wait time and Mod question cues patient reported pain and requested pain medication and to be put back to bed.  RN made aware of pain and patietn handed off to PT for transfer back to bed.  Continue with current plan of care.   FIM:  Comprehension Comprehension Mode: Auditory Comprehension: 2-Understands basic 25 - 49% of the time/requires cueing 51 - 75% of the time Expression Expression Mode: Verbal Expression: 3-Expresses basic 50 - 74% of the time/requires cueing 25 - 50% of the time. Needs to repeat parts of sentences. Social Interaction Social Interaction: 2-Interacts appropriately 25 - 49% of time - Needs frequent redirection. Problem Solving Problem Solving: 1-Solves basic less than 25% of the  time - needs direction nearly all the time or does not effectively solve problems and may need a restraint for safety Memory Memory: 2-Recognizes or recalls 25 - 49% of the time/requires cueing 51 - 75% of the time  Pain Pain Assessment Pain Assessment: Faces Faces Pain Scale: Hurts little more Pain Type: Acute pain Pain Location: Hip Pain Orientation: Left Pain Descriptors / Indicators: Grimacing Pain Onset: Gradual Pain Intervention(s): Repositioned;RN made aware Multiple Pain Sites: No  Therapy/Group: Individual Therapy  [name] FerrettiMelissa Isrrael [name], M.A., CCC-SLP [phone]  [name] 05/27/2013, 11:28 AM

## 2013-05-27 NOTE — Progress Notes (Signed)
Physical Therapy Note  Patient Details  Name: [name] MRN: [id number] Date of Birth: [birth year] Today's Date: 05/27/2013  Time: 1115-1130 15 minutes (Pt missed 30 minutes skilled PT due to pt refusal)  1:1 Pt c/o pain in hip, med's rec'd.  Pt refused to participate in PT session, agreeable to transfer back to bed.  Pt requires +2 assist for safety, max/total A for squat pivot transfer w/c to bed.  Seated edge of bed pt performed leaning on L shoulder to decrease pushing.  Pt only able to maintain x 30 seconds before requiring redirection due to inattention.  Bed mobility with max A for rolling both sides.   Enzley Kitchens 05/27/2013, 2:22 PM

## 2013-05-27 NOTE — Progress Notes (Signed)
Occupational Therapy Session Note  Patient Details  Name: [name] MRN: [id number] Date of Birth: 09/16/1950  Today's Date: 05/27/2013 Time: 0901-1005 Time Calculation (min): 64 min  Short Term Goals: Week 3:  OT Short Term Goal 1 (Week 3): Pt will transfer supine to sit EOB with max assist. OT Short Term Goal 2 (Week 3): Pt will perform UB bathing in supported sitting with mod assist for 2 consecutive sessions with no more than mod demonstrational cueing. OT Short Term Goal 3 (Week 3): Pt will sit EOB statically for 2 mins with min assist during selfcare tasks. OT Short Term Goal 4 (Week 3): Pt will tolerate hand over hand assist to intgrate the RUE into bathing task. OT Short Term Goal 5 (Week 3): Pt will perform toilet transfer with max assist squat pivot to drop arm commode.  Skilled Therapeutic Interventions/Progress Updates:    Pt performed bathing and dressing sitting EOB this session.  Pt initially saying no when therapist stated they had to get up and work on sitting on the side of the bed to bathe and dress.  Pt kept stating "30 mins" in reference to having us come back.  Told him this wasn't an option.  Pt still agitated and cursed at therapist, however therapist still managed to have him sit on the side of the bed with total assist and participate.  Pt's girlfriend came in before session as well and helped to encourage him to participate.  Pt needed max assist for sitting balance EOB for bathing.  Pt given max instructional cueing to wash his face, right arm, front peri area, and upper legs.  Pt needing total assist +2 (pt 30%) for sit to stand for washing peri area and pulling pants and brief over his hips.  Transferred to wheelchair with total assist squat pivot to the left side Bobath technique.  Pt performed oral hygiene at the sink from wheelchair with max instructional cueing to not swallow water.  Pt left in room with girlfriend and other family member.    Therapy  Documentation Precautions:  Precautions Precautions: Fall Precaution Comments: R sided inattention,Dense Right hemiparesis,  pusher toward Right, global aphasia deficits Restrictions Weight Bearing Restrictions: No  Pain: Pain Assessment Pain Assessment: Faces Faces Pain Scale: Hurts little more Pain Type: Acute pain Pain Location: Hip Pain Orientation: Left Pain Descriptors / Indicators: Grimacing Pain Onset: Gradual Pain Intervention(s): Repositioned;RN made aware Multiple Pain Sites: No ADL: See FIM for current functional status  Therapy/Group: Individual Therapy  Vivan Agostino OTR/L 05/27/2013, 12:14 PM

## 2013-05-28 ENCOUNTER — Encounter (HOSPITAL_COMMUNITY): Payer: Medicare Other

## 2013-05-28 DIAGNOSIS — M109 Gout, unspecified: Secondary | ICD-10-CM

## 2013-05-28 DIAGNOSIS — I69920 Aphasia following unspecified cerebrovascular disease: Secondary | ICD-10-CM

## 2013-05-28 DIAGNOSIS — G8110 Spastic hemiplegia affecting unspecified side: Secondary | ICD-10-CM

## 2013-05-28 DIAGNOSIS — I619 Nontraumatic intracerebral hemorrhage, unspecified: Secondary | ICD-10-CM

## 2013-05-28 NOTE — Progress Notes (Signed)
Subjective/Complaints: Right leg a little sore. Appears comfortable. Good night. Review of Systems - not able to obtain secondary to aphasia (limited) Objective: Vital Signs: Blood pressure 145/73, pulse 70, temperature 98.2 F (36.8 C), temperature source Oral, resp. rate 18, height $RemoveBe'6\' 5"'MLuANHoOB$  (1.956 m), weight 83.4 kg (183 lb 13.8 oz), SpO2 96.00%. No results found. Results for orders placed during the hospital encounter of 05/06/13 (from the past 72 hour(s))  CREATININE, SERUM     Status: Abnormal   Collection Time    05/27/13  5:50 AM      Result Value Range   Creatinine, Ser 1.18  0.50 - 1.35 mg/dL   GFR calc non Af Amer 64 (*) >90 mL/min   GFR calc Af Amer 75 (*) >90 mL/min   Comment: (NOTE)     The eGFR has been calculated using the CKD EPI equation.     This calculation has not been validated in all clinical situations.     eGFR's persistently <90 mL/min signify possible Chronic Kidney     Disease.      Constitutional: He appears well-developed and well-nourished.  HENT:  Head: Normocephalic and atraumatic.  Eyes: Conjunctivae are normal. Pupils are equal, round, and reactive to light.  Neck: Normal range of motion. Neck supple.  Cardiovascular: Regular rhythm. Tachycardia present.  Respiratory: Effort normal and breath sounds normal. No respiratory distress. He has no wheezes.   GI: Soft. Bowel sounds are normal. He exhibits no distension. There is no tenderness.  Neurological: He is alert.   Flat affect with delayed processing. Restless and distracted. Minimal verbal output. Dense right hemiparesis with sensory deficits. Moves LUE/LLE with visual/verbal cues. Unable to follow simple commands. RUE and RLE are 0/5. Marland Kitchen Able to only state name, moderate dysarthria Skin: Skin is warm and dry  MSK , mild pain with ankle dorsiflexion   Assessment/Plan: 1. Functional deficits secondary to Left MCA infarct, R flaccid hemiplegia, aphasia, dysphagia which require 3+ hours per day of  interdisciplinary therapy in a comprehensive inpatient rehab setting. Physiatrist is providing close team supervision and 24 hour management of active medical problems listed below. Physiatrist and rehab team continue to assess barriers to discharge/monitor patient progress toward functional and medical goals.  FIM: FIM - Bathing Bathing Steps Patient Completed: Chest;Right Arm;Abdomen;Right upper leg;Left upper leg;Front perineal area Bathing: 1: Two helpers  FIM - Upper Body Dressing/Undressing Upper body dressing/undressing steps patient completed: Thread/unthread left sleeve of pullover shirt/dress Upper body dressing/undressing: 2: Max-Patient completed 25-49% of tasks FIM - Lower Body Dressing/Undressing Lower body dressing/undressing steps patient completed: Thread/unthread left pants leg Lower body dressing/undressing: 1: Two helpers  FIM - Toileting Toileting: 1: Two helpers  FIM - Air cabin crew Transfers: 1-Two helpers  FIM - Control and instrumentation engineer Devices: Arm rests Bed/Chair Transfer: 1: Sit > Supine: Total A (helper does all/Pt. < 25%);1: Chair or W/C > Bed: Total A (helper does all/Pt. < 25%)  FIM - Locomotion: Wheelchair Distance: 100 Locomotion: Wheelchair: 2: Travels 50 - 149 ft with maximal assistance (Pt: 25 - 49%) FIM - Locomotion: Ambulation Locomotion: Ambulation Assistive Devices: MaxiSky Ambulation/Gait Assistance: 1: +2 Total assist Locomotion: Ambulation: 0: Activity did not occur  Comprehension Comprehension Mode: Auditory Comprehension: 2-Understands basic 25 - 49% of the time/requires cueing 51 - 75% of the time  Expression Expression Mode: Verbal Expression: 2-Expresses basic 25 - 49% of the time/requires cueing 50 - 75% of the time. Uses single words/gestures.  Social Interaction Social Interaction: 2-Interacts  appropriately 25 - 49% of time - Needs frequent redirection.  Problem Solving Problem Solving:  1-Solves basic less than 25% of the time - needs direction nearly all the time or does not effectively solve problems and may need a restraint for safety  Memory Memory: 1-Recognizes or recalls less than 25% of the time/requires cueing greater than 75% of the time  Medical Problem List and Plan:  1. 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  Doppler no carotid stenosis 2. DVT Prophylaxis/Anticoagulation:Right peroneal (calf) DVT Subcutaneous Lovenox initiated 05/05/2013. Monitor platelet counts.   - repeat doppler 1/26, have increased lovenox to $RemoveBe'30mg'ediKCSxYG$   BID 3. Pain Management: Tylenol as needed , R ankle pain ,R calf DVT and R ankle xray- 4. Neuropsych: This patient is not capable of making decisions on his own behalf.  5. Dysphagia. Dysphagia 1 honey thick liquids. Monitor closely for any aspiration. Followup speech therapy  6. Hypertension.Uncontrolled with tachycardia Norvasc 10 mg daily, hydrochlorothiazide 25 mg daily. titrate BB 7. OSA. CPAP. Will follow pulmonary services as needed        LOS (Days) 22 A FACE TO FACE EVALUATION WAS PERFORMED  SWARTZ,ZACHARY T 05/28/2013, 9:04 AM

## 2013-05-28 NOTE — Progress Notes (Signed)
Patient refused CPAP. Equipment not in room at this time.

## 2013-05-29 ENCOUNTER — Inpatient Hospital Stay (HOSPITAL_COMMUNITY): Payer: Medicare Other

## 2013-05-29 NOTE — Progress Notes (Signed)
Resp Care Note;Pt has refused Cpap every night,D'cd it from pt work list.

## 2013-05-29 NOTE — Progress Notes (Signed)
Subjective/Complaints: Right calf remains a little sore. Appears generally comfortable though Review of Systems - not able to obtain secondary to aphasia (limited) Objective: Vital Signs: Blood pressure 127/79, pulse 67, temperature 98.4 F (36.9 C), temperature source Oral, resp. rate 18, height $RemoveBe'6\' 5"'aHbpKwnDQ$  (1.956 m), weight 83.4 kg (183 lb 13.8 oz), SpO2 94.00%. No results found. Results for orders placed during the hospital encounter of 05/06/13 (from the past 72 hour(s))  CREATININE, SERUM     Status: Abnormal   Collection Time    05/27/13  5:50 AM      Result Value Range   Creatinine, Ser 1.18  0.50 - 1.35 mg/dL   GFR calc non Af Amer 64 (*) >90 mL/min   GFR calc Af Amer 75 (*) >90 mL/min   Comment: (NOTE)     The eGFR has been calculated using the CKD EPI equation.     This calculation has not been validated in all clinical situations.     eGFR's persistently <90 mL/min signify possible Chronic Kidney     Disease.      Constitutional: He appears well-developed and well-nourished.  HENT:  Head: Normocephalic and atraumatic.  Eyes: Conjunctivae are normal. Pupils are equal, round, and reactive to light.  Neck: Normal range of motion. Neck supple.  Cardiovascular: Regular rhythm. Tachycardia present.  Respiratory: Effort normal and breath sounds normal. No respiratory distress. He has no wheezes.   GI: Soft. Bowel sounds are normal. He exhibits no distension. There is no tenderness.  Neurological: He is alert.   Flat affect with delayed processing. Restless and distracted. Minimal verbal output. Dense right hemiparesis with sensory deficits. Moves LUE/LLE with visual/verbal cues. Unable to follow simple commands. RUE and RLE are 0/5. Marland Kitchen Able to only state name, moderate dysarthria Skin: Skin is warm and dry  MSK , mild pain with ankle dorsiflexion   Assessment/Plan: 1. Functional deficits secondary to Left MCA infarct, R flaccid hemiplegia, aphasia, dysphagia which require 3+  hours per day of interdisciplinary therapy in a comprehensive inpatient rehab setting. Physiatrist is providing close team supervision and 24 hour management of active medical problems listed below. Physiatrist and rehab team continue to assess barriers to discharge/monitor patient progress toward functional and medical goals.  FIM: FIM - Bathing Bathing Steps Patient Completed: Chest;Right Arm;Abdomen;Right upper leg;Left upper leg;Front perineal area Bathing: 1: Two helpers  FIM - Upper Body Dressing/Undressing Upper body dressing/undressing steps patient completed: Thread/unthread left sleeve of pullover shirt/dress Upper body dressing/undressing: 1: Two helpers FIM - Lower Body Dressing/Undressing Lower body dressing/undressing steps patient completed: Thread/unthread left pants leg Lower body dressing/undressing: 1: Two helpers  FIM - Toileting Toileting: 1: Two helpers  FIM - Radio producer Devices: Recruitment consultant Transfers: 1-Two helpers  FIM - Control and instrumentation engineer Devices: Arm rests Bed/Chair Transfer: 1: Two helpers  FIM - Locomotion: Wheelchair Distance: 100 Locomotion: Wheelchair: 2: Travels 14 - 149 ft with maximal assistance (Pt: 25 - 49%) FIM - Locomotion: Ambulation Locomotion: Ambulation Assistive Devices: MaxiSky Ambulation/Gait Assistance: 1: +2 Total assist Locomotion: Ambulation: 0: Activity did not occur  Comprehension Comprehension Mode: Auditory Comprehension: 2-Understands basic 25 - 49% of the time/requires cueing 51 - 75% of the time  Expression Expression Mode: Verbal Expression: 2-Expresses basic 25 - 49% of the time/requires cueing 50 - 75% of the time. Uses single words/gestures.  Social Interaction Social Interaction: 2-Interacts appropriately 25 - 49% of time - Needs frequent redirection.  Problem Solving Problem Solving: 1-Solves  basic less than 25% of the time - needs  direction nearly all the time or does not effectively solve problems and may need a restraint for safety  Memory Memory: 1-Recognizes or recalls less than 25% of the time/requires cueing greater than 75% of the time  Medical Problem List and Plan:  1. 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  Doppler no carotid stenosis 2. DVT Prophylaxis/Anticoagulation:Right peroneal (calf) DVT Subcutaneous Lovenox initiated 05/05/2013. Monitor platelet counts.   - repeat doppler 1/26, lovenox increased to $RemoveBefo'30mg'rovrLrDbKUD$   BID 3. Pain Management: Tylenol as needed , R ankle pain ,R calf DVT and R ankle xray- 4. Neuropsych: This patient is not capable of making decisions on his own behalf.  5. Dysphagia. Dysphagia 1 honey thick liquids. Monitor closely for any aspiration. Followup speech therapy   -on HS IVF 6. Hypertension.Uncontrolled with tachycardia Norvasc 10 mg daily, hydrochlorothiazide 25 mg daily. titrate BB 7. OSA. CPAP. Will follow pulmonary services as needed        LOS (Days) 23 A FACE TO FACE EVALUATION WAS PERFORMED  Adewale Pucillo T 05/29/2013, 8:22 AM

## 2013-05-30 ENCOUNTER — Encounter (HOSPITAL_COMMUNITY): Payer: Medicare Other | Admitting: Occupational Therapy

## 2013-05-30 ENCOUNTER — Inpatient Hospital Stay (HOSPITAL_COMMUNITY): Payer: Medicare Other | Admitting: Rehabilitation

## 2013-05-30 ENCOUNTER — Inpatient Hospital Stay (HOSPITAL_COMMUNITY): Payer: Medicare Other | Admitting: Speech Pathology

## 2013-05-30 DIAGNOSIS — I69920 Aphasia following unspecified cerebrovascular disease: Secondary | ICD-10-CM

## 2013-05-30 DIAGNOSIS — M109 Gout, unspecified: Secondary | ICD-10-CM

## 2013-05-30 DIAGNOSIS — I619 Nontraumatic intracerebral hemorrhage, unspecified: Secondary | ICD-10-CM

## 2013-05-30 DIAGNOSIS — Z86718 Personal history of other venous thrombosis and embolism: Secondary | ICD-10-CM

## 2013-05-30 DIAGNOSIS — G8110 Spastic hemiplegia affecting unspecified side: Secondary | ICD-10-CM

## 2013-05-30 NOTE — Progress Notes (Signed)
Physical Therapy Session Note  Patient Details  Name: Linna Capricedward A Gamboa MRN: [id number] Date of Birth: 21-Oct-1950  Today's Date: 05/30/2013 Time: [id number] Time Calculation (min): 43 min  Short Term Goals: Week 3:  PT Short Term Goal 1 (Week 3): =LTG's due to expected LOS  Skilled Therapeutic Interventions/Progress Updates:    Pt received lying in bed asleep.  Continues to be very resistant to participating with therapy this afternoon.  Sister eventually present during session to assist with motivating pt to get OOB.  Performed bed mobility at total assist +2 level with pt resisting movement and did not assist at all.  Performed squat pivot transfer utilizing bobath method to continue to facilitate increased forward weight shift.  [facility] requires max to total assist for static sitting balance due to increased pushing to the R.  Continue to provide max verbal and visual cues for L lateral leaning.  Pt very resistant to wanting to perform standing activity, however eventually agreed to perform.  Focused on standing activity with +2 assist (pt assist 30%) while reaching to the L to decrease pusher tendencies in standing.  Pt continued to resist performing activity therefore sister continued to verbalize importance of performing therapy.  Performed standing x 5-6 mins with total assist cues to attend to task.  Pt returned to w/c as mentioned above and returned to room.  Quick release belt and half lap tray donned with sister present in room with pt for safety.    Continued to have discussion with sister regarding pts lack of progress and regression in past week of therapy.  Pts sister states "he didn't do this on Friday" in regards to poor sitting balance, however she arrived following therapies and discussed that these behaviors have been an ongoing issue.   Therapy Documentation Precautions:  Precautions Precautions: Fall Precaution Comments: R sided inattention,Dense Right hemiparesis,   pusher toward Right, global aphasia deficits Restrictions Weight Bearing Restrictions: No   Vital Signs: Therapy Vitals Temp: 98.7 F (37.1 C) Temp src: Axillary Pulse Rate: 71 BP: 134/73 mmHg Patient Position, if appropriate: Lying Oxygen Therapy SpO2: 95 % Pain: pt with mild c/o pain in RLE during mobility, esp when transferring back to chair.  Repositioned RLE on leg rest with no further c/o pain.   See FIM for current functional status  Therapy/Group: Individual Therapy  Vista Deckarcell, Brayah Urquilla Ann 05/30/2013, 4:43 PM

## 2013-05-30 NOTE — Progress Notes (Signed)
Speech Language Pathology Daily Session Note  Patient Details  Name: [name] A Dinkins MRN: [id number] Date of Birth: 1950/09/23  Today's Date: 05/30/2013 Time: 1050-1120 Time Calculation (min): 30 min  Short Term Goals: Week 3: SLP Short Term Goal 1 (Week 3): Pt will utilize safe swallowing strategies with Dys 2 textures and nectar-thick liquids with Mod cues  SLP Short Term Goal 2 (Week 3): Pt will consume trials of thin liquids with small sips and minimal overt s/s of aspiration with Max cues SLP Short Term Goal 3 (Week 3): Pt will sustain attention to basic functional task for 2 minutes with Max cues SLP Short Term Goal 4 (Week 3): Pt will communicate basic wants/needs via multimodal communication with Mod cues SLP Short Term Goal 5 (Week 3): Pt will follow one-step commands with Max cues SLP Short Term Goal 6 (Week 3): Pt will receptively identify common objects from a field of four with Supervision cues  Skilled Therapeutic Interventions: Skilled treatment session focused on addressing cognitive-linguistic goals.  Patient participated in structured naming activity with Max encouragement from sister and clinician.  Patient labeled items in picture but required Max sentences completion and phonemic cues to describe what was happening in various pictures.  Continue with current plan of care.   FIM:  Comprehension Comprehension Mode: Auditory Comprehension: 2-Understands basic 25 - 49% of the time/requires cueing 51 - 75% of the time Expression Expression Mode: Verbal Expression: 2-Expresses basic 25 - 49% of the time/requires cueing 50 - 75% of the time. Uses single words/gestures. Social Interaction Social Interaction: 3-Interacts appropriately 50 - 74% of the time - May be physically or verbally inappropriate. Problem Solving Problem Solving: 1-Solves basic less than 25% of the time - needs direction nearly all the time or does not effectively solve problems and may need a restraint  for safety Memory Memory: 2-Recognizes or recalls 25 - 49% of the time/requires cueing 51 - 75% of the time  Pain  Yes, RN aware  Therapy/Group: Individual Therapy  Charlane FerrettiMelissa Carolyn Maniscalco, M.A., CCC-SLP [phone]  Itzia Cunliffe 05/30/2013, 2:56 PM

## 2013-05-30 NOTE — Progress Notes (Signed)
Social Work Patient ID: Linna CapriceEdward A Stogdill, male   DOB: 1950/07/14, 63 y.o.   MRN: [id number] Spoke with Sandy-sister to discuss pt's plans and awaiting doppler studies.  Pt may also need a mid-line for hydration needs. Contacted [facility] to inform not ready tomorrow, will continue to update and she reports can offer a bed if has a male one when he is medically Ready for transfer.  Sister to be here until tomorrow.

## 2013-05-30 NOTE — Progress Notes (Signed)
Subjective/Complaints: No leg or calf pain, no SOB, comfortable without agitation Review of Systems - not able to obtain secondary to aphasia (limited) Objective: Vital Signs: Blood pressure 145/77, pulse 75, temperature 100 F (37.8 C), temperature source Oral, resp. rate 19, height 6\' 5"  (1.956 m), weight 83.4 kg (183 lb 13.8 oz), SpO2 95.00%. No results found. No results found for this or any previous visit (from the past 72 hour(s)).    Constitutional: He appears well-developed and well-nourished.  HENT:  Head: Normocephalic and atraumatic.  Eyes: Conjunctivae are normal. Pupils are equal, round, and reactive to light.  Neck: Normal range of motion. Neck supple.  Cardiovascular: Regular rhythm.  Respiratory: Effort normal and breath sounds normal. No respiratory distress. He has no wheezes.   GI: Soft. Bowel sounds are normal. He exhibits no distension. There is no tenderness.  Neurological: He is alert.   Flat affect with delayed processing. Restless and distracted. Minimal verbal output. Dense right hemiparesis with sensory deficits. Moves LUE/LLE with visual/verbal cues. Unable to follow simple commands. RUE and RLE are 0/5. Marland Kitchen. Able to only state name, moderate dysarthria Skin: Skin is warm and dry  MSK , mild pain with ankle dorsiflexion   Assessment/Plan: 1. 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 Physiatrist is providing close team supervision and 24 hour management of active medical problems listed below. Physiatrist and rehab team continue to assess barriers to discharge/monitor patient progress toward functional and medical goals.  FIM: FIM - Bathing Bathing Steps Patient Completed: Chest;Right Arm;Abdomen;Front perineal area;Right upper leg;Left upper leg;Left lower leg (including foot) Bathing: 1: Total-Patient  completes 0-2 of 10 parts or less than 25%  FIM - Upper Body Dressing/Undressing Upper body dressing/undressing steps patient completed: Thread/unthread left sleeve of pullover shirt/dress;Put head through opening of pull over shirt/dress Upper body dressing/undressing: 2: Max-Patient completed 25-49% of tasks FIM - Lower Body Dressing/Undressing Lower body dressing/undressing steps patient completed: Thread/unthread left pants leg Lower body dressing/undressing: 1: Two helpers  FIM - Toileting Toileting: 1: Two helpers  FIM - Diplomatic Services operational officerToilet Transfers Toilet Transfers Assistive Devices: PsychiatristBedside commode Toilet Transfers: 1-Two helpers  FIM - BankerBed/Chair Transfer Bed/Chair Transfer Assistive Devices: Arm rests Bed/Chair Transfer: 1: Two helpers  FIM - Locomotion: Wheelchair Distance: 100 Locomotion: Wheelchair: 2: Travels 50 - 149 ft with maximal assistance (Pt: 25 - 49%) FIM - Locomotion: Ambulation Locomotion: Ambulation Assistive Devices: MaxiSky Ambulation/Gait Assistance: 1: +2 Total assist Locomotion: Ambulation: 0: Activity did not occur  Comprehension Comprehension Mode: Auditory Comprehension: 2-Understands basic 25 - 49% of the time/requires cueing 51 - 75% of the time  Expression Expression Mode: Verbal Expression: 2-Expresses basic 25 - 49% of the time/requires cueing 50 - 75% of the time. Uses single words/gestures.  Social Interaction Social Interaction: 2-Interacts appropriately 25 - 49% of time - Needs frequent redirection.  Problem Solving Problem Solving: 1-Solves basic less than 25% of the time - needs direction nearly all the time or does not effectively solve problems and may need a restraint for safety  Memory Memory: 2-Recognizes or recalls 25 - 49% of the time/requires cueing 51 - 75% of the time  Medical Problem List and Plan:  1. 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  Doppler no carotid stenosis 2. DVT  Prophylaxis/Anticoagulation:Right peroneal (calf) DVT Subcutaneous Lovenox initiated 05/05/2013. Monitor platelet counts.   - repeat doppler 1/26, lovenox  increased to 30mg   BID 3. Pain Management: Tylenol as needed , R ankle pain ,R calf DVT and R ankle xray- 4. Neuropsych: This patient is not capable of making decisions on his own behalf.  5. Dysphagia. Dysphagia 1 honey thick liquids. Monitor closely for any aspiration. Followup speech therapy   -on HS IVF 6. Hypertension.Uncontrolled with tachycardia Norvasc 10 mg daily, hydrochlorothiazide 25 mg daily. titrate BB 7. OSA. CPAP. Will follow pulmonary services as needed  8.  Low grade temp x 1 monitor, could be DVT related or atelectasis       LOS (Days) 24 A FACE TO FACE EVALUATION WAS PERFORMED  Ruvim Risko E 05/30/2013, 6:52 AM

## 2013-05-30 NOTE — Progress Notes (Signed)
Patient increased intake of fluid 7a-7p. Patient did not void within 8 hours. Bladder scanned for 350cc at 1815. Patient unable to void and in and out cathed for 450cc thick cloudy orange colored urine . Condom cath applied . Continue with plan of care .                       [name], [name]

## 2013-05-30 NOTE — Progress Notes (Signed)
Occupational Therapy Session Note  Patient Details  Name: [name] MRN: [id number] Date of Birth: 09-27-1950  Today's Date: 05/30/2013 Time: 0901-1005 Time Calculation (min): 64 min  Short Term Goals: Week 3:  OT Short Term Goal 1 (Week 3): Pt will transfer supine to sit EOB with max assist. OT Short Term Goal 2 (Week 3): Pt will perform UB bathing in supported sitting with mod assist for 2 consecutive sessions with no more than mod demonstrational cueing. OT Short Term Goal 3 (Week 3): Pt will sit EOB statically for 2 mins with min assist during selfcare tasks. OT Short Term Goal 4 (Week 3): Pt will tolerate hand over hand assist to intgrate the RUE into bathing task. OT Short Term Goal 5 (Week 3): Pt will perform toilet transfer with max assist squat pivot to drop arm commode.  Skilled Therapeutic Interventions/Progress Updates:    Pt performed bathing and dressing during session.  Pt's twin sister present for session and encouraging as pt stated he was not doing therapy when this therapist entered the room.  His sister persuaded him to participate.  Performed transfer to the wheelchair with Bobath technique to the right with total assist.  Pt with increased fear when transferring in this position, however it is the safest secondary to his increased pushing.  Rolled the wheelchair to the sink for bathing and dressing.  Pt still with decreased motivation to participate but therapist and his sister kept encouraging him.  Performed UB with max instructional cueing and min assist supported sitting in the chair.  When he attempts to come forward out of the back of the chair he immediately pushed himself to the right resulting in LOB.  When therapist tries to help correct his balance at times he becomes upset.  When given a target to take his left shoulder too for balance he is unable to respond effectively.  Still needs total assist +2 (pt 30%) for sit to stand and standing to wash his peri area  or pull his pants over his hips.  Used rinse less shampoo cap on his head to wash his hair this session.  Pt at times still with agitation and decreased cognitive understanding of directions. Tends to close his eyes frequently during session and sometimes place his head in his hands.  Therapist helped to place pt's right foot in his pants leg and he was able to perform the left and then reach down to pull them up to his knees with max assist to avoid falling to the right.  Therapist assisted with TEDs and shoes as well as right ankle splint.  Pt with increased pain in the right foot and ankle this session.  Discussed with pts' sister that he has made very little progress with therapy and a lot of his lack of progress is due to his limited active participation.  Even though this therapist has seen him for every session attempted he still is resistant to trying and has to be made to participate.      Therapy Documentation Precautions:  Precautions Precautions: Fall Precaution Comments: R sided inattention,Dense Right hemiparesis,  pusher toward Right, global aphasia deficits Restrictions Weight Bearing Restrictions: No  Pain: Pain Assessment Pain Assessment: Faces Pain Location: Ankle Pain Orientation: Right Pain Intervention(s): Medication (See eMAR);Repositioned  ADL: See FIM for current functional status  Therapy/Group: Individual Therapy  Warrene Kapfer OTR/L 05/30/2013, 12:35 PM

## 2013-05-30 NOTE — Progress Notes (Signed)
*  Preliminary Results* Right lower extremity venous duplex completed. Right lower extremity is positive for deep vein thrombosis involving the proximal and mid segments of the right peroneal veins. The thrombosed peroneal veins appear to be dilated when compared to the previous study on 05/24/2013. The distal peroneal veins are patent. There is no evidence of right Baker's cyst.  Preliminary results discussed with Angie, RN.  05/30/2013 4:54 PM  Gertie FeyMichelle Vue Pavon, RVT, RDCS, RDMS

## 2013-05-31 ENCOUNTER — Encounter (HOSPITAL_COMMUNITY): Payer: Medicare Other | Admitting: Occupational Therapy

## 2013-05-31 ENCOUNTER — Inpatient Hospital Stay (HOSPITAL_COMMUNITY): Payer: Medicare Other | Admitting: Rehabilitation

## 2013-05-31 ENCOUNTER — Inpatient Hospital Stay (HOSPITAL_COMMUNITY): Payer: Medicare Other | Admitting: Speech Pathology

## 2013-05-31 MED ORDER — SODIUM CHLORIDE 0.9 % IJ SOLN
10.0000 mL | INTRAMUSCULAR | Status: DC | PRN
  Administered 2013-06-06: 10 mL

## 2013-05-31 NOTE — Progress Notes (Signed)
Subjective/Complaints: No leg or calf pain, no SOB, comfortable without agitation Review of Systems - not able to obtain secondary to aphasia (limited) Objective: Vital Signs: Blood pressure 143/67, pulse 77, temperature 98.1 F (36.7 C), temperature source Axillary, resp. rate 18, height 6\' 5"  (1.956 m), weight 83.4 kg (183 lb 13.8 oz), SpO2 97.00%. No results found. No results found for this or any previous visit (from the past 72 hour(s)).    Constitutional: He appears well-developed and well-nourished.  HENT:  Head: Normocephalic and atraumatic.  Eyes: Conjunctivae are normal. Pupils are equal, round, and reactive to light.  Neck: Normal range of motion. Neck supple.  Cardiovascular: Regular rhythm.  Respiratory: Effort normal and breath sounds normal. No respiratory distress. He has no wheezes.   GI: Soft. Bowel sounds are normal. He exhibits no distension. There is no tenderness.  Neurological: He is alert.   Flat affect with delayed processing. Restless and distracted. Minimal verbal output. Dense right hemiparesis with sensory deficits. Moves LUE/LLE with visual/verbal cues. Unable to follow simple commands. RUE and RLE are 0/5. Marland Kitchen Able to only state name, moderate dysarthria Skin: Skin is warm and dry  MSK , mild pain with ankle dorsiflexion   Assessment/Plan: 1. 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 No  Progression of DVT to popliteal, stable for transfer to SNF PMR f/u 1 mo, VVS f/u 3-4 weeks, cont lovenox until that time  FIM: FIM - Bathing Bathing Steps Patient Completed: Chest;Right Arm;Abdomen;Right upper leg;Left upper leg Bathing: 1: Two helpers  FIM - Upper Body Dressing/Undressing Upper body dressing/undressing steps patient completed: Thread/unthread left sleeve of pullover shirt/dress;Put head through  opening of pull over shirt/dress Upper body dressing/undressing: 3: Mod-Patient completed 50-74% of tasks FIM - Lower Body Dressing/Undressing Lower body dressing/undressing steps patient completed: Thread/unthread left pants leg Lower body dressing/undressing: 1: Two helpers  FIM - Toileting Toileting: 1: Two helpers  FIM - Diplomatic Services operational officer Devices: Psychiatrist Transfers: 1-Two helpers  FIM - Banker Devices: Arm rests Bed/Chair Transfer: 1: Two helpers  FIM - Locomotion: Wheelchair Distance: 100 Locomotion: Wheelchair: 2: Travels 50 - 149 ft with maximal assistance (Pt: 25 - 49%) FIM - Locomotion: Ambulation Locomotion: Ambulation Assistive Devices: MaxiSky Ambulation/Gait Assistance: 1: +2 Total assist Locomotion: Ambulation: 0: Activity did not occur  Comprehension Comprehension Mode: Auditory Comprehension: 2-Understands basic 25 - 49% of the time/requires cueing 51 - 75% of the time  Expression Expression Mode: Verbal Expression: 2-Expresses basic 25 - 49% of the time/requires cueing 50 - 75% of the time. Uses single words/gestures.  Social Interaction Social Interaction: 3-Interacts appropriately 50 - 74% of the time - May be physically or verbally inappropriate.  Problem Solving Problem Solving: 1-Solves basic less than 25% of the time - needs direction nearly all the time or does not effectively solve problems and may need a restraint for safety  Memory Memory: 2-Recognizes or recalls 25 - 49% of the time/requires cueing 51 - 75% of the time  Medical Problem List and Plan:  1. 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  Doppler no carotid stenosis 2. DVT Prophylaxis/Anticoagulation:Right peroneal (calf) DVT Subcutaneous Lovenox initiated 05/05/2013. Monitor platelet counts.   - repeat doppler 1/26 no progression to popliteal vein, lovenox increased  to 30mg   BID 3. Pain Management: Tylenol as needed , R  ankle pain ,R calf DVT and R ankle xray- 4. Neuropsych: This patient is not capable of making decisions on his own behalf.  5. Dysphagia. Dysphagia 1 honey thick liquids. Monitor closely for any aspiration. Followup speech therapy   -on HS IVF, place midline 6. Hypertension.Uncontrolled with tachycardia Norvasc 10 mg daily, hydrochlorothiazide 25 mg daily. titrate BB 7. OSA. CPAP. Will follow pulmonary services as needed         LOS (Days) 25 A FACE TO FACE EVALUATION WAS PERFORMED  Carrol Bondar E 05/31/2013, 7:16 AM

## 2013-05-31 NOTE — Progress Notes (Signed)
Social Work Patient ID: [name] CapriceEdward A [name], male   DOB: 11-14-1950, 63 y.o.   MRN: [id number] [name] BootsOpal is willing to sign pt into NH tomorrow.  Will gather paperwork and pt is aware of the plans.  [name] agreeable [place] at 12[place]00. Team aware of discharge tomorrow.

## 2013-05-31 NOTE — Progress Notes (Signed)
Social Work Patient ID: [name] CapriceEdward A [name], male   DOB: [birth year], 63 y.o.   MRN: [id number] MD reports pt is medically [facility]  Have informed Sandy-sister and have given her the number for admissions to contact. She questioned of he was ready with a UTI and Angie-RN reported he is being treated for this and will continue the antibiotics in NH.  Spoke also with [name] to inform of The plan and she reports she would be willing to sign him into NH if Andrey CampanileSandy is leaving and unable too.  Will work on transfer to NH, mid-line to be placed today.

## 2013-05-31 NOTE — Progress Notes (Signed)
Peripherally Inserted Central Catheter/Midline Placement  The IV Nurse has discussed with the patient and/or persons authorized to consent for the patient, the purpose of this procedure and the potential benefits and risks involved with this procedure.  The benefits include less needle sticks, lab draws from the catheter and patient may be discharged home with the catheter.  Risks include, but not limited to, infection, bleeding, blood clot (thrombus formation), and puncture of an artery; nerve damage and irregular heat beat.  Alternatives to this procedure were also discussed.  PICC/Midline Placement Documentation        Vevelyn PatDuncan, Kimm Ungaro Jean 05/31/2013, 11:58 AM

## 2013-05-31 NOTE — Discharge Summary (Addendum)
[name] [name]:  [name] [name]                ACCOUNT NO.:  [id number]  MEDICAL RECORD NO.:  [id number]  LOCATION:  [id number]                        FACILITY:  [facility]  PHYSICIAN:  Erick ColaceAndrew E. Kirsteins, M.D.DATE OF BIRTH:  04/09/1951  DATE OF ADMISSION:  05/06/2013 DATE OF DISCHARGE:  06/06/2013                              DISCHARGE SUMMARY   DISCHARGE DIAGNOSES: 1. Left parietal basal ganglia intracerebral hemorrhage, felt to be     secondary to malignant hypertension. 2. Subcutaneous Lovenox initiated May 05, 2013. 3. Right peroneal deep venous thrombosis per venous Doppler study     May 24, 2013. 4. Dysphagia. 5. Hypertension. 6. Obstructive sleep apnea. 7.Complicated UTI-Levaquin  HISTORY OF PRESENT ILLNESS:  This is a 63 year old male with history of hypertension, on no medications, who was found down on the floor with inability to move his right side.  He was admitted on May 01, 2013, found to have a large 4.6 x 3.9 cm intraparenchymal hematoma at the left parietal lobe adjacent to the left basal ganglia and thalamus with extension of blood into the left lateral ventricle with mild associated vasogenic edema.  Blood pressure elevated to 247/132, patient started on Cardene drip for control.  Echocardiogram with ejection fraction of 65%, mild mitral regurgitation.  Carotid Dopplers without significant ICA stenosis.  The patient with resultant dysarthria, aphasia, left gaze preference with right hemiparesis and sensory deficits.  Maintained on a dysphagia 1 honey thick liquid diet.  Latest followup cranial CT scan is stable when Lovenox later initiated for DVT prophylaxis on May 05, 2013.  The patient with acute respiratory distress on May 02, 2013, requiring BiPAP-CPAP, suspect untreated obstructive sleep apnea and follow up Pulmonary Services.  Physical, occupational, and speech therapy ongoing.  The patient was admitted for comprehensive rehab program.  PAST MEDICAL  HISTORY:  See discharge diagnoses.  SOCIAL HISTORY:  Lives with significant other.  FUNCTIONAL HISTORY:  Prior to admission independent.  Functional status upon admission to rehab services was +2 total assist for upper extremity assistance, +2 total assist sit to stand.  PHYSICAL EXAMINATION:  VITAL SIGNS:  Blood pressure 174/97, pulse 78, temperature 98.6, respirations 24. GENERAL:  This was an alert male.  He showed a right inattention. HEENT:  Pupils reactive to light. LUNGS:  Decreased breath sounds.  Clear to auscultation. CARDIAC:  Rate controlled. ABDOMEN:  Soft, nontender.  Good bowel sounds. NEUROLOGIC:  Minimal verbal output.  Right hemiparesis with sensory deficits.  REHABILITATION HOSPITAL COURSE:  Patient was admitted to Inpatient Rehab Services with therapies initiated on a 3-hour daily basis consisting of physical therapy, occupational therapy, speech therapy, and rehabilitation nursing.  The following issues were addressed during the patient's rehabilitation stay.  Pertaining to [name] [name]'s left parietal basal ganglia ICH felt to be secondary to malignant hypertension remained stable with latest cranial CT scan May 24, 2013, showing improved hemorrhage in the left external capsule with mild mass effect. No new hemorrhage identified.  He would follow up with Neurology Services.  During his rehab stay, he had some swelling of the right calf.  Venous Doppler study showed right popliteal DVT on May 24, 2013, this was repeated on May 30, 2013 to  rule out any propagation of DVT that remained unchanged.  The patient remained on Lovenox 30 mg daily.  This was increased to twice a day.  No bleeding episodes established.  He would continue Lovenox for 1 month after discharge. His diet had been advanced to a dysphagia 1 nectar thick liquid, however, close monitoring need for hydration with intravenous fluids ongoing.  A midline catheter had been placed to help  maintain patient for his hydration.  Blood pressures remained controlled, monitored diastolics in the 70s.  He was on Norvasc 10 mg daily. Hydrochlorothiazide 25 mg daily as well as labetalol.  Remeron had been added to patient's course to help establish mood control as well as stimulate any appetite with emotional support provided. Patient spiked low-grade fever with urinalysis study and gram-negative rods as well as blood cultures. Infectious disease consulted initially on intravenous antibiotics placed on Levaquin changed to by mouth x5 days patient remained afebrile after initiation of Levaquin. The patient received weekly collaborative interdisciplinary team conferences to discuss estimated length of stay, family teaching, any barriers to discharge.  Patient performed bed mobility at total assist +2 level with patient resisting movement and did not assist with his mobility. Perform a squat pivot transfers utilizing Bobath method to continue to facilitate increased forward weight shift.  Required max to total assist for static sitting balance due to increased pushing to the right. Continues to provide max verbal and visual cues for left lateral leaning.  Patient performs standing 5-6 minutes with total assist and cues to attend the tasks.  The patient with limited advances during his rehab stay was felt that a skilled nursing facility would be needed, and patient was discharged once nursing home bed available.  DISCHARGE MEDICATIONS:  At the time of dictation included, 1. 0.45 sodium chloride infusion IV fluids to 75 mL at 7:00 pm to 7:00     am daily. 2. Xanax 0.25 mg p.o. t.i.d. 3. Norvasc 10 mg p.o. daily. 4. Lovenox 30 mg subcutaneously every 12 hours x1 month and stop. 5. Hydrochlorothiazide 25 mg p.o. daily. 6. Hydrocodone 5-325 mg 1 tablet every 4 hours as needed moderate     pain. 7. Labetalol 300 mg p.o. b.i.d. 8. Remeron 30 mg p.o. at bedtime. 9. Protonix 40 mg p.o.  daily. 10.Levaquin 750 mg daily x4 more days and stop DIET:  His diet was a dysphagia 2 nectar thick liquid.  The patient will follow up Dr. Claudette [name] at the outpatient rehab center as directed.  Monitor oxygen saturations every shift and apply 2 L oxygen saturations less than 90%.     Mariam Dollar, P.A.   ______________________________ Erick Colace, M.D.    DA/MEDQ  D:  05/31/2013  T:  05/31/2013  Job:  [id number]  cc:   Coralyn Helling, MD Pramod P. Pearlean Brownie, MD

## 2013-05-31 NOTE — Discharge Summary (Signed)
Discharge summary job [id number]

## 2013-05-31 NOTE — Progress Notes (Signed)
Occupational Therapy Session Note  Patient Details  Name: [name] A Rizzi MRN: [id number] Date of Birth: February 17, 1951  Today's Date: 05/31/2013 Time: 0903-1008 Time Calculation (min): 65 min  Short Term Goals: Week 3:  OT Short Term Goal 1 (Week 3): Pt will transfer supine to sit EOB with max assist. OT Short Term Goal 2 (Week 3): Pt will perform UB bathing in supported sitting with mod assist for 2 consecutive sessions with no more than mod demonstrational cueing. OT Short Term Goal 3 (Week 3): Pt will sit EOB statically for 2 mins with min assist during selfcare tasks. OT Short Term Goal 4 (Week 3): Pt will tolerate hand over hand assist to intgrate the RUE into bathing task. OT Short Term Goal 5 (Week 3): Pt will perform toilet transfer with max assist squat pivot to drop arm commode.  Skilled Therapeutic Interventions/Progress Updates:    Pt transferred to the wheelchair with total assist to the right side.  Pt needing max coaxing and sister present to actively participate.  Pt still with increased agitation when asked to wash his face or his body.  Requested transfer to toilet during session.  Transferred stand pivot with total assist +2 (pt 30%) to the right side.  Also + 2 for hygiene and clothing in standing.  Transferred back to the wheelchair with Bobath technique squat pivot.  Pt with increased pain when flexing trunk forward for transfers but unable to accurately identify where the pain was.  Performed bathing sit to stand with max instructional cueing.  Still needs +2 total assist (pt 30%) for sit to stand in order for second person to pull up his pants.  Total assist to donn TEDs, ankle splint, and shoes secondary to limited time.  Pt left in wheelchair with sister present.    Therapy Documentation Precautions:  Precautions Precautions: Fall Precaution Comments: R sided inattention,Dense Right hemiparesis,  pusher toward Right, global aphasia deficits Restrictions Weight Bearing  Restrictions: No  Pain: Pain Assessment Pain Assessment: No/denies pain Pain Score: 0-No pain ADL: See FIM for current functional status  Therapy/Group: Individual Therapy  Tamerra Merkley OTR/L 05/31/2013, 12:30 PM

## 2013-05-31 NOTE — Progress Notes (Signed)
Physical Therapy Session Note  Patient Details  Name: Linna Capricedward A Zachow MRN: [id number] Date of Birth: 10-06-1950  Today's Date: 05/31/2013 Time: 1100-1110 Time Calculation (min): 10 min  Short Term Goals: Week 3:  PT Short Term Goal 1 (Week 3): =LTG's due to expected LOS  Skilled Therapeutic Interventions/Progress Updates:   Pt received sitting in w/c in room.  Note IV nurse in room to perform midline placement procedure, therefore assisted pt back to bed and missed 50 min of skilled PT this morning.  Performed squat pivot transfer towards the L w/c to bed at +2 assist (pt assist 30%) utilizing bobath method to increase forward weight shift.  Pt continues to demonstrate increased resistance to movement and assist during session.  Once at EOB, assisted pt onto L elbow to prepare for getting into supine position.  Pt continued to resist movement, therefore requires +2 assist to down trunk and elevate LEs into bed.  Pt left in bed with all needs in reach.    Therapy Documentation Precautions:  Precautions Precautions: Fall Precaution Comments: R sided inattention,Dense Right hemiparesis,  pusher toward Right, global aphasia deficits Restrictions Weight Bearing Restrictions: No General: Amount of Missed PT Time (min): 50 Minutes Missed Time Reason: Other (comment) (pt having midline placed. ) Vital Signs: Therapy Vitals Pulse Rate: 78 BP: 150/70 mmHg Pain: Pain Assessment Pain Assessment: No/denies pain Pain Score: 0-No pain  See FIM for current functional status  Therapy/Group: Individual Therapy  Vista Deckarcell, Hellen Shanley Ann 05/31/2013, 12:20 PM

## 2013-05-31 NOTE — Plan of Care (Signed)
Problem: RH Balance Goal: LTG Patient will maintain dynamic standing with ADLs (OT) LTG: Patient will maintain dynamic standing balance with assist during activities of daily living (OT)  Outcome: Not Met (add Reason) Needs total assist +2  Problem: RH Bathing Goal: LTG Patient will bathe with assist, cues/equipment (OT) LTG: Patient will bathe specified number of body parts with assist with/without cues using equipment (position) (OT)  Outcome: Not Met (add Reason) Needs +2 for sit to stand and standing with LB selfcare.  Problem: RH Dressing Goal: LTG Patient will perform lower body dressing w/assist (OT) LTG: Patient will perform lower body dressing with assist, with/without cues in positioning using equipment (OT)  Outcome: Not Met (add Reason) Needs +2 total assist for dressing sit to stand.  Problem: RH Toileting Goal: LTG Patient will perform toileting w/assist, cues/equip (OT) LTG: Patient will perform toiletiing (clothes management/hygiene) with assist, with/without cues using equipment (OT)  Outcome: Not Met (add Reason) Needs total +2 for sit to stand during toileting tasks.

## 2013-05-31 NOTE — Progress Notes (Signed)
Speech Language Pathology Daily Session Note & Discharge Summary   Patient Details  Name: [name] MRN: [id number] Date of Birth: 14-Jun-1950  Today's Date: 05/31/2013 Time:  1020-1058    Short Term Goals: Week 3: SLP Short Term Goal 1 (Week 3): Pt will utilize safe swallowing strategies with Dys 2 textures and nectar-thick liquids with Mod cues  SLP Short Term Goal 2 (Week 3): Pt will consume trials of thin liquids with small sips and minimal overt s/s of aspiration with Max cues SLP Short Term Goal 3 (Week 3): Pt will sustain attention to basic functional task for 2 minutes with Max cues SLP Short Term Goal 4 (Week 3): Pt will communicate basic wants/needs via multimodal communication with Mod cues SLP Short Term Goal 5 (Week 3): Pt will follow one-step commands with Max cues SLP Short Term Goal 6 (Week 3): Pt will receptively identify common objects from a field of four with Supervision cues  Skilled Therapeutic Interventions: Skilled treatment session focused on addressing dysphagia and cognitive-linguistic goals.  Patient refused to perform oral for himself but allowed RN to complete oral care prior to initiation of trials of water.  Patient consumed cup sips of water with Max cues for portion control and pacing.  Per MBS patient demonstrates intermittent deep, silent penetration that becomes aspiration due to decreased willingness to allow SLP to cue patient and therefore dysphagia progress is limited.  Patient required Max encouragement for participation in structured naming activity and required Max sentences completion and phonemic cues to label pictures of objects.  SLP answered sister's questions regarding aphasia and dysphagia.  Patient ready for discharge to SNF.   FIM:  Comprehension Comprehension Mode: Auditory Comprehension: 2-Understands basic 25 - 49% of the time/requires cueing 51 - 75% of the time Expression Expression Mode: Verbal Expression: 2-Expresses basic 25 -  49% of the time/requires cueing 50 - 75% of the time. Uses single words/gestures. Social Interaction Social Interaction: 3-Interacts appropriately 50 - 74% of the time - May be physically or verbally inappropriate. Problem Solving Problem Solving: 1-Solves basic less than 25% of the time - needs direction nearly all the time or does not effectively solve problems and may need a restraint for safety Memory Memory: 2-Recognizes or recalls 25 - 49% of the time/requires cueing 51 - 75% of the time  Pain  yes, RN aware of spasms and patient pre-medicated   Therapy/Group: Individual Therapy   Speech Language Pathology Discharge Summary  Patient Details  Name: [name] MRN: [id number] Date of Birth: [birth year]  Patient has met 4 of 7 long term goals.  Patient to discharge at overall Max level.  Reasons goals not met: patient's frustration and participation results in fluctuation   Clinical Impression/Discharge Summary: Patient met 4 out of 7 long term goals during his CIR stay.  Patient's frustration and fluctuating participation resulted in Mod-Max expressive and receptive abilities.  Patient currently demonstrates non-fluent aphasia with islands of spontaneous phrase level expression.  Patient's pain and participation are the most limiting factors to his overall progress.  His poor insight and overall awareness are other hurdles that also hinder his participation and therefore progress.  He continues to require Max cues for safety with p.o.   Given the severity of dysphagia and significantly impaired cognitive-linguistic skills he continues to require skilled SLP services to maximize safety and at the next level of care.      Care Partner:  Caregiver Able to Provide Assistance: No  Type of  Caregiver Assistance: Physical;Cognitive  Recommendation:  24 hour supervision/assistance;Skilled Nursing facility  Rationale for SLP Follow Up: Maximize functional communication;Maximize cognitive  function and independence;Maximize swallowing safety;Reduce caregiver burden   Equipment:  none  Reasons for discharge: Discharged from hospital   Patient/Family Agrees with Progress Made and Goals Achieved: Yes   See FIM for current functional status  Carmelia Roller., CCC-SLP [phone]  [place] 05/31/2013, 4:54 PM

## 2013-06-01 ENCOUNTER — Inpatient Hospital Stay (HOSPITAL_COMMUNITY): Payer: Medicare Other

## 2013-06-01 DIAGNOSIS — R509 Fever, unspecified: Secondary | ICD-10-CM | POA: Diagnosis not present

## 2013-06-01 LAB — BASIC METABOLIC PANEL
BUN: 34 mg/dL — ABNORMAL HIGH (ref 6–23)
CALCIUM: 9.3 mg/dL (ref 8.4–10.5)
CO2: 20 mEq/L (ref 19–32)
Chloride: 99 mEq/L (ref 96–112)
Creatinine, Ser: 1.26 mg/dL (ref 0.50–1.35)
GFR, EST AFRICAN AMERICAN: 69 mL/min — AB (ref >90)
GFR, EST NON AFRICAN AMERICAN: 59 mL/min — AB (ref >90)
Glucose, Bld: 117 mg/dL — ABNORMAL HIGH (ref 70–99)
Potassium: 4.3 mEq/L (ref 3.7–5.3)
SODIUM: 135 meq/L — AB (ref 137–147)

## 2013-06-01 LAB — CBC WITH DIFFERENTIAL/PLATELET
BASOS PCT: 0 % (ref 0–1)
Basophils Absolute: 0 10*3/uL (ref 0.0–0.1)
EOS PCT: 0 % (ref 0–5)
Eosinophils Absolute: 0 10*3/uL (ref 0.0–0.7)
HCT: 36.4 % — ABNORMAL LOW (ref 39.0–52.0)
Hemoglobin: 12.6 g/dL — ABNORMAL LOW (ref 13.0–17.0)
LYMPHS PCT: 4 % — AB (ref 12–46)
Lymphs Abs: 0.6 10*3/uL — ABNORMAL LOW (ref 0.7–4.0)
MCH: 29 pg (ref 26.0–34.0)
MCHC: 34.6 g/dL (ref 30.0–36.0)
MCV: 83.9 fL (ref 78.0–100.0)
MONO ABS: 0.3 10*3/uL (ref 0.1–1.0)
Monocytes Relative: 3 % (ref 3–12)
NEUTROS ABS: 12.4 10*3/uL — AB (ref 1.7–7.7)
NEUTROS PCT: 93 % — AB (ref 43–77)
PLATELETS: 217 10*3/uL (ref 150–400)
RBC: 4.34 MIL/uL (ref 4.22–5.81)
RDW: 13 % (ref 11.5–15.5)
WBC: 13.4 10*3/uL — AB (ref 4.0–10.5)

## 2013-06-01 LAB — INFLUENZA PANEL BY PCR (TYPE A & B)
H1N1FLUPCR: NOT DETECTED
INFLBPCR: NEGATIVE
Influenza A By PCR: NEGATIVE

## 2013-06-01 MED ORDER — PIPERACILLIN-TAZOBACTAM 3.375 G IVPB
3.3750 g | Freq: Three times a day (TID) | INTRAVENOUS | Status: DC
  Administered 2013-06-01 – 2013-06-02 (×5): 3.375 g via INTRAVENOUS
  Filled 2013-06-01 (×7): qty 50

## 2013-06-01 MED ORDER — BISACODYL 10 MG RE SUPP: 10.0000 mg | Freq: Every day | RECTAL | Status: DC | PRN

## 2013-06-01 MED ORDER — VANCOMYCIN HCL 10 G IV SOLR
1500.0000 mg | Freq: Once | INTRAVENOUS | Status: AC
  Administered 2013-06-01: 1500 mg via INTRAVENOUS
  Filled 2013-06-01: qty 1500

## 2013-06-01 MED ORDER — CIPROFLOXACIN HCL 500 MG PO TABS: 500.0000 mg | ORAL_TABLET | Freq: Two times a day (BID) | ORAL | Status: DC

## 2013-06-01 MED ORDER — VANCOMYCIN HCL IN DEXTROSE 1-5 GM/200ML-% IV SOLN
1000.0000 mg | Freq: Two times a day (BID) | INTRAVENOUS | Status: DC
  Administered 2013-06-01 – 2013-06-02 (×3): 1000 mg via INTRAVENOUS
  Filled 2013-06-01 (×4): qty 200

## 2013-06-01 NOTE — Progress Notes (Signed)
Nursing Note: Pt resting quietly.Repeated tylenol for temp.T-1003 P-94 R-20 BP-108/66 PO2 96 % on 2L n/c.wbb

## 2013-06-01 NOTE — Progress Notes (Signed)
Nursing Note: Paged on-call and made aware that pt also had a picc line placed earlier today and obtained orders for IV vancomycin and IV zosyn per pharmacy protocol.Note will give dose of acetaminophen.wbb

## 2013-06-01 NOTE — Progress Notes (Signed)
Nursing Note: Pt no longer shaking T-102.6 rectal P-94 R-22 BP-133/66 PO2 96% on 2L n/c.Head of bed elevated.Pt resting quietly in bed.wbb

## 2013-06-01 NOTE — Progress Notes (Signed)
After talking with PA, started a PIV in right hand for Vancomycin for now.

## 2013-06-01 NOTE — Progress Notes (Signed)
Spoke with Morrie SheldonAshley, primary RN regarding need for midline to be exchanged for a PICC due to pt starting Vancomycin this morning.  MD to be notified for an exchange order.  Instructed not to give Vancomycin via midline.

## 2013-06-01 NOTE — Progress Notes (Signed)
ANTIBIOTIC CONSULT NOTE - INITIAL  Pharmacy Consult for Vancomycin and Zosyn  Indication: fevers  No Known Allergies  Patient Measurements: Height: 6\' 5"  (195.6 cm) Weight: 183 lb 13.8 oz (83.4 kg) IBW/kg (Calculated) : 89.1  Vital Signs: Temp: 102.2 F (39 C) (01/28 0005) Temp src: Axillary (01/28 0005) BP: 157/88 mmHg (01/28 0005) Pulse Rate: 98 (01/28 0005) Intake/Output from previous day: 01/27 0701 - 01/28 0700 In: 240 [P.O.:240] Out: 850 [Urine:850] Intake/Output from this shift: Total I/O In: -  Out: 100 [Urine:100]  Labs: SCr 1.18 05/27/13  No results found for this basename: WBC, HGB, PLT, LABCREA, CREATININE,  in the last 72 hours Estimated Creatinine Clearance: 76.6 ml/min (by C-G formula based on Cr of 1.18). No results found for this basename: VANCOTROUGH, VANCOPEAK, VANCORANDOM, GENTTROUGH, GENTPEAK, GENTRANDOM, TOBRATROUGH, TOBRAPEAK, TOBRARND, AMIKACINPEAK, AMIKACINTROU, AMIKACIN,  in the last 72 hours   Microbiology: No results found for this or any previous visit (from the past 720 hour(s)).  Medical History: Past Medical History  Diagnosis Date  . Hypertension     Medications:  Scheduled:  . ALPRAZolam  0.25 mg Oral TID  . amLODipine  10 mg Oral Daily  . antiseptic oral rinse  15 mL Mouth Rinse q12n4p  . chlorhexidine  15 mL Mouth Rinse BID  . enoxaparin (LOVENOX) injection  30 mg Subcutaneous Q12H  . feeding supplement (ENSURE)  1 Container Oral TID AC & HS  . hydrochlorothiazide  25 mg Oral Daily  . labetalol  300 mg Oral BID  . megestrol  400 mg Oral BID  . mirtazapine  30 mg Oral QHS  . nystatin  5 mL Oral QID  . pantoprazole  40 mg Oral Daily  . senna-docusate  1 tablet Oral BID   Assessment: 63 yo male s/p ICH, now with fevers/chills for empiric antibiotics  Goal of Therapy:  Vancomycin trough level 15-20 mcg/ml  Plan:  Vancomycin 1500 mg IV now, then 1 g IV q12h Zosyn 3.375 g IV q8h F/U renal function  Shaunessy Dobratz, Gary FleetGregory  [place] 06/01/2013,12:21 AM

## 2013-06-01 NOTE — Progress Notes (Signed)
Nursing Note: Vancomycin and zosyn IV hung and checked skin for reaction to Vanc. After 30 min.T-102.5 P-92 R-20 BP-117/68 PO2 96%the patient turned and repositioned and oral care provided.wbb

## 2013-06-01 NOTE — Progress Notes (Signed)
Nursing Note: Pt resting quietly.More alert.Refused ensure pudding.No complaints and no distress noted.wbb

## 2013-06-01 NOTE — Progress Notes (Signed)
Physical Therapy Discharge Summary  Patient Details  Name: [name] MRN: [id number] Date of Birth: Sep 18, 1950  Today's Date: 06/01/2013    Patient has met 2 of 5 long term goals.  Pt made very little progress with therapy and actually regressed with all mobility tasks in last week due to increased agitation and resistance to participate in therapy.  Patient to discharge at a wheelchair level Max Assist.   Pts family did not take part in formal training due to D/C to SNF.    Reasons goals not met: Pt continues to be very inconsistent with all mobility and can require mod to max assist for sitting balance, max to +2 assist for transfers and +2 assist for any standing activity.  Continues to demonstrate increase pusher postures during all movements, further limiting pts progress.    Recommendation:  Patient will benefit from ongoing skilled PT services in skilled nursing facility setting to continue to advance safe functional mobility, address ongoing impairments in decreased safety, decreased awareness, decreased attention, decreased sitting/standing balance (static and dynamic), decreased functional use of RUE/LE, and minimize fall risk.  Equipment: No equipment provided  Reasons for discharge: discharge from hospital  Patient/family agrees with progress made and goals achieved: Yes  PT Discharge Precautions/Restrictions Precautions Precautions: Fall Precaution Comments: R sided inattention,Dense Right hemiparesis,  pusher toward Right, global aphasia deficits Restrictions Weight Bearing Restrictions: No Vital Signs Therapy Vitals Temp: 100.2 F (37.9 C) Temp src: Rectal Pulse Rate: 83 Resp: 18 BP: 117/71 mmHg Patient Position, if appropriate: Lying Oxygen Therapy SpO2: 98 % O2 Device: Nasal cannula O2 Flow Rate (L/min): 2 L/min   Vision/Perception   See OT note Cognition Overall Cognitive Status: Impaired/Different from baseline Arousal/Alertness:  Awake/alert Orientation Level: Oriented to person (difficult to assess due to aphasia) Attention: Sustained Sustained Attention: Impaired Sustained Attention Impairment: Verbal basic;Functional basic Memory: Impaired Memory Impairment: Decreased recall of new information Awareness: Impaired Awareness Impairment: Intellectual impairment Problem Solving: Impaired Problem Solving Impairment: Functional basic;Verbal basic Behaviors: Restless;Verbal agitation;Physical agitation Safety/Judgment: Impaired Comments: Pt continues to demonstrate increased frustration, agitation and restlessness during PT sessions.  Also continues to refuse to participate fully at times.   Sensation Sensation Light Touch: Impaired Detail Light Touch Impaired Details: Impaired RLE Proprioception: Impaired Detail Proprioception Impaired Details: Impaired RLE Additional Comments: Pt able to detect pain in RLE, however continues to have difficulty discerning light touch.  Difficult to formally assess due to aphasia Coordination Gross Motor Movements are Fluid and Coordinated: No Fine Motor Movements are Fluid and Coordinated: No Heel Shin Test: Pt unable to perform R over L due to weakness Motor  Motor Motor: Hemiplegia;Abnormal postural alignment and control Motor - Discharge Observations: Pt continues to demonstrate no active movement in RLE during functional tasks.  Do note slight hip extension (trace) with extreme reaching outside BOS to the L.  Also continues to demonstrate increased R lateral lean and pushing to the R in sitting/standing.   Mobility Bed Mobility Bed Mobility: Supine to Sit;Sit to Supine Supine to Sit: 2: Max assist Supine to Sit Details: Manual facilitation for placement;Manual facilitation for weight shifting;Verbal cues for technique;Verbal cues for precautions/safety Sit to Supine: 2: Max assist Sit to Supine - Details: Verbal cues for technique;Verbal cues for precautions/safety;Manual  facilitation for weight shifting;Manual facilitation for placement Transfers Transfers: Yes Sit to Stand: 1: +2 Total assist Sit to Stand Details: Verbal cues for technique;Verbal cues for precautions/safety;Manual facilitation for weight shifting;Manual facilitation for placement;Manual facilitation for weight bearing Stand  to Sit: 1: +2 Total assist Stand to Sit Details (indicate cue type and reason): Verbal cues for technique;Verbal cues for precautions/safety;Manual facilitation for placement;Manual facilitation for weight shifting;Manual facilitation for weight bearing Squat Pivot Transfers: 1: +1 Total assist;2: Max assist Squat Pivot Transfer Details: Verbal cues for technique;Verbal cues for precautions/safety;Manual facilitation for placement;Manual facilitation for weight shifting;Manual facilitation for weight bearing Locomotion  Ambulation Ambulation: No Ambulation/Gait Assistance: 1: +2 Total assist (+2 from 05/23/13) Ambulation/Gait Assistance Details: Verbal cues for technique;Verbal cues for precautions/safety;Manual facilitation for placement;Manual facilitation for weight shifting;Manual facilitation for weight bearing;Verbal cues for gait pattern Gait Gait: Yes Gait Pattern: Impaired Gait Pattern: Decreased weight shift to left;Decreased hip/knee flexion - right;Decreased dorsiflexion - left;Decreased dorsiflexion - right;Lateral trunk lean to right Stairs / Additional Locomotion Stairs: No Wheelchair Mobility Wheelchair Mobility: Yes Wheelchair Assistance: 2: Max assist (as of 1/191/15) Wheelchair Assistance Details: Verbal cues for technique;Verbal cues for precautions/safety;Manual facilitation for placement Wheelchair Propulsion: Left lower extremity;Left upper extremity  Trunk/Postural Assessment  Cervical Assessment Cervical Assessment: Within Functional Limits Thoracic Assessment Thoracic Assessment: Within Functional Limits Postural Control Postural Control:  Deficits on evaluation Trunk Control: Pt continues to have significant issues with pushing to the R with LUE, trunk and LLE.  Righting Reactions: Pt with intermittent righting reactions with LOB to the R.  There are times that he will attempt to correct, however other times, he will demonstrate overt LOB to the R.   Balance Balance Balance Assessed: Yes Static Sitting Balance Static Sitting - Balance Support: Left upper extremity supported;Feet supported Static Sitting - Level of Assistance: 2: Max assist;3: Mod assist;4: Min assist Dynamic Sitting Balance Dynamic Sitting - Balance Support: Feet supported;No upper extremity supported Dynamic Sitting - Level of Assistance: 2: Max assist Static Standing Balance Static Standing - Level of Assistance: 1: +2 Total assist Dynamic Standing Balance Dynamic Standing - Level of Assistance: 1: +2 Total assist Extremity Assessment      RLE Assessment RLE Assessment: Exceptions to [facility] RLE Strength RLE Overall Strength: Deficits RLE Overall Strength Comments: Pt with no active movement noted during functional tasks.  Would note trace hip ext/glute activation during reaching far to the L.  LLE Assessment LLE Assessment: Within Functional Limits  See FIM for current functional status  Denice Bors 06/01/2013, 12:58 PM

## 2013-06-01 NOTE — Significant Event (Addendum)
CRITICAL VALUE ALERT  Critical value received:  Gram negative rods  Date of notification:  06/01/13  Time of notification:  1917  Critical value read back:yes  Nurse who received alert:  Greta DoomAshley Jalaila Caradonna RN  MD notified (1st page):  Deatra Inaan Angiulli PA  Time of first page:  1930 MD notified (2nd page):  Time of second page:  Responding MD:  Deatra Inaan Angiulli PA  Time MD responded:  1930- no new orders received

## 2013-06-01 NOTE — Progress Notes (Signed)
Social Work Patient ID: Linna CapriceEdward A Murtha, male   DOB: 03/07/1951, 63 y.o.   MRN: [id number] [facility] inform of pt's medical issus and probable not being transferred today. MD reports doing labs and checking reason for fever.  Will update when have more information.

## 2013-06-01 NOTE — Plan of Care (Signed)
Problem: RH Balance Goal: LTG Patient will maintain dynamic sitting balance (PT) LTG: Patient will maintain dynamic sitting balance with assistance during mobility activities (PT)  Outcome: Not Met (add Reason) Pt intermittently requires mod to max assist for dynamic sitting balance.   Problem: RH Bed to Chair Transfers Goal: LTG Patient will perform bed/chair transfers w/assist (PT) LTG: Patient will perform bed/chair transfers with assistance, with/without cues (PT).  Outcome: Not Met (add Reason) Pt very inconsistent with transfers and requires max to +2 assist for transfers based on pts level of participation/resistance.   Problem: RH Wheelchair Mobility Goal: LTG Patient will propel w/c in controlled environment (PT) LTG: Patient will propel wheelchair in controlled environment, # of feet with assist (PT)  Outcome: Not Met (add Reason) Pt inconsistent with w/c mobility and at times can require max assist.

## 2013-06-01 NOTE — Progress Notes (Signed)
Subjective/Complaints: Spiked temp yest pm, had chills last noc, appears fatigued this ma Denies SOB, cough or dysuria , no other pains Review of Systems - not able to obtain secondary to aphasia (limited) Objective: Vital Signs: Blood pressure 115/62, pulse 89, temperature 100 F (37.8 C), temperature source Oral, resp. rate 18, height $RemoveBe'6\' 5"'xlooAqzMm$  (1.956 m), weight 83.4 kg (183 lb 13.8 oz), SpO2 96.00%. Dg Chest 2 View  06/01/2013   CLINICAL DATA:  Fever  EXAM: CHEST  2 VIEW  COMPARISON:  05/05/2013  FINDINGS: Prominent cardiomediastinal contours are similar to prior. Elevated hemidiaphragms. No confluent airspace opacity, pleural effusion, pneumothorax. Mild interstitial prominence. Similar to mildly improved since the prior. Anterior fusion cervical spine is partially imaged.  IMPRESSION: Prominent cardiomediastinal contours are similar to prior.  Mild interstitial prominence has improved some in the interval. May reflect mild residual interstitial edema or atypical/viral infection   Electronically Signed   By: Carlos Levering M.D.   On: 06/01/2013 01:38   Results for orders placed during the hospital encounter of 05/06/13 (from the past 72 hour(s))  BASIC METABOLIC PANEL     Status: Abnormal   Collection Time    06/01/13  1:17 AM      Result Value Range   Sodium 135 (*) 137 - 147 mEq/L   Potassium 4.3  3.7 - 5.3 mEq/L   Chloride 99  96 - 112 mEq/L   CO2 20  19 - 32 mEq/L   Glucose, Bld 117 (*) 70 - 99 mg/dL   BUN 34 (*) 6 - 23 mg/dL   Creatinine, Ser 1.26  0.50 - 1.35 mg/dL   Calcium 9.3  8.4 - 10.5 mg/dL   GFR calc non Af Amer 59 (*) >90 mL/min   GFR calc Af Amer 69 (*) >90 mL/min   Comment: (NOTE)     The eGFR has been calculated using the CKD EPI equation.     This calculation has not been validated in all clinical situations.     eGFR's persistently <90 mL/min signify possible Chronic Kidney     Disease.  CBC WITH DIFFERENTIAL     Status: Abnormal   Collection Time    06/01/13   1:17 AM      Result Value Range   WBC 13.4 (*) 4.0 - 10.5 K/uL   RBC 4.34  4.22 - 5.81 MIL/uL   Hemoglobin 12.6 (*) 13.0 - 17.0 g/dL   HCT 36.4 (*) 39.0 - 52.0 %   MCV 83.9  78.0 - 100.0 fL   MCH 29.0  26.0 - 34.0 pg   MCHC 34.6  30.0 - 36.0 g/dL   RDW 13.0  11.5 - 15.5 %   Platelets 217  150 - 400 K/uL   Neutrophils Relative % 93 (*) 43 - 77 %   Neutro Abs 12.4 (*) 1.7 - 7.7 K/uL   Lymphocytes Relative 4 (*) 12 - 46 %   Lymphs Abs 0.6 (*) 0.7 - 4.0 K/uL   Monocytes Relative 3  3 - 12 %   Monocytes Absolute 0.3  0.1 - 1.0 K/uL   Eosinophils Relative 0  0 - 5 %   Eosinophils Absolute 0.0  0.0 - 0.7 K/uL   Basophils Relative 0  0 - 1 %   Basophils Absolute 0.0  0.0 - 0.1 K/uL      Constitutional: He appears well-developed and well-nourished.  HENT:  Head: Normocephalic and atraumatic.  Eyes: Conjunctivae are normal. Pupils are equal, round, and reactive to  light.  Neck: Normal range of motion. Neck supple.  Cardiovascular: Regular rhythm.  Respiratory: Effort normal and breath sounds normal. No respiratory distress. He has no wheezes.   GI: Soft. Bowel sounds are normal. He exhibits no distension. There is no tenderness.  Neurological: He is alert.   Flat affect with delayed processing. Restless and distracted. Minimal verbal output. Dense right hemiparesis with sensory deficits. Moves LUE/LLE with visual/verbal cues. Unable to follow simple commands. RUE and RLE are 0/5. Marland Kitchen Able to only state name, moderate dysarthria Skin: Skin is warm and dry  MSK , mild pain with ankle dorsiflexion   Assessment/Plan: 1. 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  Elevated temp no obvious source, suspect UTI, awaiting ua, c and s as well as BC, will cont IV abx until source is identified Hold off on transfer to SNF,should stabilize within 48  hours  FIM: FIM - Bathing Bathing Steps Patient Completed: Chest;Right Arm;Abdomen;Right upper leg;Left upper leg Bathing: 1: Two helpers  FIM - Upper Body Dressing/Undressing Upper body dressing/undressing steps patient completed: Thread/unthread left sleeve of pullover shirt/dress;Pull shirt over trunk Upper body dressing/undressing: 3: Mod-Patient completed 50-74% of tasks FIM - Lower Body Dressing/Undressing Lower body dressing/undressing steps patient completed: Thread/unthread left pants leg Lower body dressing/undressing: 1: Two helpers  FIM - Toileting Toileting: 1: Two helpers  FIM - Radio producer Devices: Recruitment consultant Transfers: 1-Two helpers  FIM - Control and instrumentation engineer Devices: Arm rests Bed/Chair Transfer: 1: Sit > Supine: Total A (helper does all/Pt. < 25%)  FIM - Locomotion: Wheelchair Distance: 100 Locomotion: Wheelchair: 0: Activity did not occur FIM - Locomotion: Ambulation Locomotion: Ambulation Assistive Devices: MaxiSky Ambulation/Gait Assistance: 1: +2 Total assist Locomotion: Ambulation: 0: Activity did not occur  Comprehension Comprehension Mode: Auditory Comprehension: 2-Understands basic 25 - 49% of the time/requires cueing 51 - 75% of the time  Expression Expression Mode: Verbal Expression: 2-Expresses basic 25 - 49% of the time/requires cueing 50 - 75% of the time. Uses single words/gestures.  Social Interaction Social Interaction: 3-Interacts appropriately 50 - 74% of the time - May be physically or verbally inappropriate.  Problem Solving Problem Solving: 1-Solves basic less than 25% of the time - needs direction nearly all the time or does not effectively solve problems and may need a restraint for safety  Memory Memory: 2-Recognizes or recalls 25 - 49% of the time/requires cueing 51 - 75% of the time  Medical Problem List and Plan:  1. 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  Doppler no carotid stenosis 2. DVT Prophylaxis/Anticoagulation:Right peroneal (calf) DVT Subcutaneous Lovenox initiated 05/05/2013. Monitor platelet counts.   - repeat doppler 1/26 no progression to popliteal vein, lovenox increased to $RemoveBefo'30mg'AdIFgUpNbqd$   BID 3. Pain Management: Tylenol as needed , R ankle pain ,R calf DVT and R ankle xray- 4. Neuropsych: This patient is not capable of making decisions on his own behalf.  5. Dysphagia. Dysphagia 1 honey thick liquids. Monitor closely for any aspiration. Followup speech therapy   -on HS IVF, place midline, BUN creat elevated will increase rate esp with temp 6. Hypertension.Uncontrolled with tachycardia Norvasc 10 mg daily, hydrochlorothiazide 25 mg daily. titrate BB 7. OSA. CPAP. Will follow pulmonary services as needed         LOS (Days) 26 A FACE TO FACE EVALUATION WAS  PERFORMED  Charlett Blake 06/01/2013, 7:24 AM

## 2013-06-01 NOTE — Progress Notes (Signed)
Nursing Note: Picked up this pt after 2330 and went in to assess pt and noted that pt was shaking and obviously having chills.Pt has expressive aphasia and nodding but unable to say what was wrong but was restless and shaking  As if he were shivering.T-102.2 ax P-98 R-20 BP-157/88 PO 98 %  RE-checked the temp for rectal temp of 103.A: paged on-call and made aware of events.Pt had urine sent earlier per report.Obatined order for labs,blood cultures,CXR ,cbc w/ diff and bmet and oral cipro.Rapid response to come see pt also .wbb

## 2013-06-02 DIAGNOSIS — R509 Fever, unspecified: Secondary | ICD-10-CM

## 2013-06-02 DIAGNOSIS — N10 Acute tubulo-interstitial nephritis: Secondary | ICD-10-CM

## 2013-06-02 LAB — URINE CULTURE: Colony Count: >=100000

## 2013-06-02 MED ORDER — LEVOFLOXACIN IN D5W 750 MG/150ML IV SOLN
750.0000 mg | INTRAVENOUS | Status: DC
  Administered 2013-06-02: 750 mg via INTRAVENOUS
  Filled 2013-06-02 (×2): qty 150

## 2013-06-02 MED ORDER — SODIUM CHLORIDE 0.45 % IV SOLN
INTRAVENOUS | Status: DC
  Administered 2013-06-02 – 2013-06-05 (×5): via INTRAVENOUS

## 2013-06-02 MED ORDER — LEVOFLOXACIN 500 MG PO TABS
500.0000 mg | ORAL_TABLET | Freq: Every day | ORAL | Status: DC
  Filled 2013-06-02 (×2): qty 1

## 2013-06-02 MED ORDER — DEXTROSE 5 % IV SOLN
2.0000 g | Freq: Two times a day (BID) | INTRAVENOUS | Status: DC
  Administered 2013-06-02: 2 g via INTRAVENOUS
  Filled 2013-06-02 (×2): qty 2

## 2013-06-02 NOTE — Progress Notes (Signed)
06/02/13 1500  Vitals  Temp 100.2 F (37.9 C)  Temp src Oral  BP 137/79 mmHg  BP Location Left arm  BP Method Automatic  Patient Position, if appropriate Lying  Pulse Rate 68  Pulse Rate Source Dinamap  Resp ! 22  Oxygen Therapy  SpO2 96 %  O2 Device Nasal cannula  O2 Flow Rate (L/min) 2 L/min  Pain Assessment  Pain Assessment No/denies pain  PCA/Epidural/Spinal Assessment  Respiratory Pattern Regular  Dan Angiulli, Pa was made aware of pt  Temperature (see Above). Tylenol 650 mg was given

## 2013-06-02 NOTE — Consult Note (Signed)
[facility]     Reason for Consult:Pyelonephritis    Referring Physician: Dr. Wynn Banker  Active Problems:   left parietal intracerebral hemorrhage with intraventricular extension   . ALPRAZolam  0.25 mg Oral TID  . amLODipine  10 mg Oral Daily  . antiseptic oral rinse  15 mL Mouth Rinse q12n4p  . ceFEPime (MAXIPIME) IV  2 g Intravenous Q12H  . chlorhexidine  15 mL Mouth Rinse BID  . enoxaparin (LOVENOX) injection  30 mg Subcutaneous Q12H  . feeding supplement (ENSURE)  1 Container Oral TID AC & HS  . labetalol  300 mg Oral BID  . megestrol  400 mg Oral BID  . mirtazapine  30 mg Oral QHS  . nystatin  5 mL Oral QID  . pantoprazole  40 mg Oral Daily  . senna-docusate  1 tablet Oral BID  . vancomycin  1,000 mg Intravenous Q12H    Recommendations: levaquin 500 mg po x 6 more days Will d/c vancomycin and cefepime Will watch blood cultures to be sure they match the urine Klebsiella   Assessment: Pyelonephritis with fever and bacteruria   Antibiotics: Vancomycin and cefepime today  HPI: [name] is a 63 y.o. male with left parietal basal ganglia ICH from hypertension, DVT, in rehab who developed fever and elevated WBC.  Work up revealed Klebsiella in urine with GNR in blood cultures.  Started empirically on vancomycin and cefepime.  No currently having any complaints of fever or chills, though history is limited with this patients.  No dysuria or frequency reported.    Review of Systems: A comprehensive review of systems was negative.  Past Medical History  Diagnosis Date  . Hypertension     History  Substance Use Topics  . Smoking status: Current Every Day Smoker  . Smokeless tobacco: Not on file  . Alcohol Use: Not on file    No family history on file. No Known Allergies  OBJECTIVE: Blood pressure 156/85, pulse 82, temperature 98.8 F (37.1 C), temperature source Oral, resp. rate 18, height 6\' 5"  (1.956 m), weight 183 lb 13.8  oz (83.4 kg), SpO2 97.00%. General: awake, alert, confused Skin: no rashes Lungs: CTA B Cor: RRR  Abdomen: soft, nt, nd Ext: no edema  Microbiology: Recent Results (from the past 240 hour(s))  URINE CULTURE     Status: None   Collection Time    05/31/13  6:40 PM      Result Value Range Status   Specimen Description URINE, CATHETERIZED   Final   Special Requests NONE   Final   Culture  Setup Time     Final   Value: 05/31/2013 19:39     Performed at [facility] Count     Final   Value: >=100,000 COLONIES/ML     Performed at [facility]   Culture     Final   Value: KLEBSIELLA PNEUMONIAE     Performed at [facility]   Report Status 06/02/2013 FINAL   Final   Organism ID, Bacteria KLEBSIELLA PNEUMONIAE   Final  CULTURE, BLOOD (ROUTINE X 2)     Status: None   Collection Time    06/01/13  1:12 AM      Result Value Range Status   Specimen Description BLOOD RIGHT ARM   Final   Special Requests BOTTLES DRAWN AEROBIC AND ANAEROBIC 10CC EACH   Final   Culture  Setup Time  Final   Value: 06/01/2013 08:26     [facility]formed [facility]   Culture     Final   Value: GRAM NEGATIVE RODS     Note: Gram Stain Report Called to,Read Back By and Verified With: [facility] ROYAL [id number] BRMEL     Performed at [facility]   Report Status PENDING   Incomplete  CULTURE, BLOOD (ROUTINE X 2)     Status: None   Collection Time    06/01/13  1:17 AM      Result Value Range Status   Specimen Description BLOOD RIGHT HAND   Final   Special Requests BOTTLES DRAWN AEROBIC ONLY 10CC   Final   Culture  Setup Time     Final   Value: 06/01/2013 08:25     Performed at [facility]   Culture     Final   Value: GRAM NEGATIVE RODS     Note: Gram Stain Report Called to,Read Back By and Verified With: Mick SellSHANNON AMBURN [id number] BRMEL     [facility]formed at [facility]   Report Status PENDING   Incomplete    Staci RighterOMER, Kimyah Frein, MD [facility] [email or web address] [phone] pager  [phone] cell 06/02/2013, 3:01 PM

## 2013-06-02 NOTE — Progress Notes (Signed)
Social Work Patient ID: [name] CapriceEdward A Delarocha, male   DOB: August 09, 1950, 63 y.o.   MRN: [id number] Spoke with opal she is planning to come visit him tomorrow am.  Informed her still not medically stable for transfer to NH. Informed pt of [name] visiting tomorrow and calling him later after her eye doctor appointment.

## 2013-06-02 NOTE — Progress Notes (Signed)
NUTRITION FOLLOW UP  Intervention:   - Continue Ensure pudding TID - RD to continue to monitor   NUTRITION DIAGNOSIS:  Inadequate oral intake related to poor appetite as evidenced by refusing meals, PO 0-5% - ongoing   Monitor:  1. Food/Beverage; pt meeting >/=90% estimated needs with tolerance - not met  2. Wt/wt change; monitor trends - monitoring, up 1 pound since admission likely from fluid  Assessment:   Pt admitted with functional deficits r/t to left MCA infarct, R hemiplegia, aphasia, and dysphagia.  Pt remains aphasic.  He continues Megace BID for appetite stimulation, but thus far has not shown improvement. Now lethargic in setting of fevers taking only bites and sips of meals.  Ongoing work- up for source.   Pt has been discharged from therapy.  He is planning to d/c to SNF once medically stable.    Height: Ht Readings from Last 1 Encounters:  05/06/13 $RemoveB'6\' 5"'MQukmwtY$  (1.956 m)    Weight Status:   Wt Readings from Last 1 Encounters:  05/27/13 183 lb 13.8 oz (83.4 kg)  Admit wt:        184 lb (83.4 kg) Net I/Os: +1.7L  Re-estimated needs:  Kcal: 2000-2200  Protein: 105-125g  Fluid: >2.0 L/day  Skin: intact  Diet Order: Dysphagia 2, nectar-thick   Intake/Output Summary (Last 24 hours) at 06/02/13 1241 Last data filed at 06/02/13 0900  Gross per 24 hour  Intake    120 ml  Output   1550 ml  Net  -1430 ml    Last BM: 1/25   Labs:   Recent Labs Lab 05/27/13 0550 06/01/13 0117  NA  --  135*  K  --  4.3  CL  --  99  CO2  --  20  BUN  --  34*  CREATININE 1.18 1.26  CALCIUM  --  9.3  GLUCOSE  --  117*    CBG (last 3)  No results found for this basename: GLUCAP,  in the last 72 hours  Scheduled Meds: . ALPRAZolam  0.25 mg Oral TID  . amLODipine  10 mg Oral Daily  . antiseptic oral rinse  15 mL Mouth Rinse q12n4p  . ceFEPime (MAXIPIME) IV  2 g Intravenous Q12H  . chlorhexidine  15 mL Mouth Rinse BID  . enoxaparin (LOVENOX) injection  30 mg Subcutaneous  Q12H  . feeding supplement (ENSURE)  1 Container Oral TID AC & HS  . labetalol  300 mg Oral BID  . megestrol  400 mg Oral BID  . mirtazapine  30 mg Oral QHS  . nystatin  5 mL Oral QID  . pantoprazole  40 mg Oral Daily  . senna-docusate  1 tablet Oral BID  . vancomycin  1,000 mg Intravenous Q12H    Continuous Infusions: . sodium chloride 75 mL/hr at 06/02/13 0849    Brynda Greathouse, MS RD LDN Clinical Inpatient Dietitian Pager: [phone] Weekend/After hours pager: [phone]

## 2013-06-02 NOTE — Progress Notes (Signed)
Occupational Therapy Discharge Summary  Patient Details  Name: [name] MRN: [id number] Date of Birth: January 03, 1951  Today's Date: 06/02/2013  Patient has met 6 of 10 long term goals due to improved balance.  Patient to discharge at overall Total Assist level.  Recommend transition to SNF for continued rehab as pt is not at a manageable level for selfcare tasks and functional transfers to transition home with his girlfriend.  Reasons goals not met: Pt continues to need total assist to total assist +2 for LB dressing and toileting secondary to pushing toward the right side and increased fear of falling.  He also continues to need max demonstrational cueing to sequence all selfcare as well and has no carryover from day to day.  Recommendation:  Patient will benefit from ongoing skilled OT services in skilled nursing facility setting to continue to advance functional skills in the area of BADL.  Pt has made very little functional gains in ADLs with inpatient rehab stay.  He continues to demonstrate significant pushing to the right side in both sitting and standing.  He also demonstrates very little active movement in either the RLE or RUE.  Pt continues to demonstrate global aphasia as well as increased agitation when therapy is attempted.  Progress has been severely limited secondary to his decreased understanding of why he needs therapy and his ability to participate.  Feel SNF level therapies will fit him better based on his limited participation and need for extended therapy greater than one month.  Equipment: No equipment provided  Reasons for discharge: lack of progress toward goals and discharge from hospital  Patient/family agrees with progress made and goals achieved: Pt unable to state.  OT Discharge Precautions/Restrictions  Precautions Precautions: Fall Precaution Comments: R sided inattention,Dense Right hemiparesis,  pusher toward Right, global aphasia  deficits Restrictions Weight Bearing Restrictions: No  Pain Pain Assessment Pain Assessment: No/denies pain ADL  See FIM scale Vision/Perception  Vision - History Baseline Vision: Wears glasses all the time Vision - Assessment Eye Alignment: Within Functional Limits Vision Assessment: Vision tested Ocular Range of Motion: Within Functional Limits Alignment/Gaze Preference: Within Defined Limits Tracking/Visual Pursuits: Able to track stimulus in all quads without difficulty Visual Fields: No apparent deficits Perception Perception: Impaired Spatial Orientation: Pt is a pusher to the right in sitting and standing. Praxis Praxis: Impaired Praxis Impairment Details: Initiation;Ideomotor;Perseveration  Cognition Overall Cognitive Status: Impaired/Different from baseline Arousal/Alertness: Awake/alert Orientation Level: Disoriented to time;Disoriented to situation;Disoriented to place Attention: Sustained;Focused Focused Attention: Appears intact Sustained Attention: Impaired Sustained Attention Impairment: Functional basic Memory: Impaired Memory Impairment: Decreased recall of new information;Retrieval deficit Awareness: Impaired Awareness Impairment: Intellectual impairment Problem Solving: Impaired Problem Solving Impairment: Functional basic Behaviors: Physical agitation Safety/Judgment: Impaired Comments: Pt continues to demonstrate increased frustration, agitation and restlessness during OT sessions.  At times not even attempting to participate and also resisting hand over hand assistance to initiate and perform bathing and dressing tasks.   Sensation Sensation Light Touch: Impaired Detail Light Touch Impaired Details: Impaired RUE Stereognosis: Not tested Hot/Cold: Not tested Proprioception: Impaired Detail Proprioception Impaired Details: Impaired RLE Additional Comments: Pt unable to accurrately assess sensation secondary to cognitive impairment and agitation.   Pt continues to leave the RUE hanging off the side of his chair or body without self correction.  Coordination Gross Motor Movements are Fluid and Coordinated: No Fine Motor Movements are Fluid and Coordinated: No Coordination and Movement Description: Pt continues Brunnstrum stage I in the hand and stage II in  the arm.   Motor  Motor Motor: Hemiplegia;Abnormal postural alignment and control Motor - Discharge Observations: Pt with increased pushing to the right and frequent LOB to the right in sitting.  He is unable to selfcorrect and continues pushing harder if you try to selfcorrect.  No functional use noted with the RUE. Mobility  Bed Mobility Bed Mobility: Supine to Sit;Sit to Supine Rolling Right: 2: Max assist;With rail Rolling Right Details: Verbal cues for sequencing;Manual facilitation for placement Rolling Left: 2: Max assist Rolling Left Details: Manual facilitation for placement;Manual facilitation for weight shifting;Verbal cues for technique Left Sidelying to Sit: 1: +1 Total assist;HOB flat Left Sidelying to Sit Details: Manual facilitation for weight shifting;Verbal cues for technique;Verbal cues for sequencing Sitting - Scoot to Edge of Bed: 1: +1 Total assist Sitting - Scoot to Edge of Bed Details: Verbal cues for sequencing;Manual facilitation for placement;Manual facilitation for weight shifting Transfers Transfers: Sit to Stand Sit to Stand: 1: +1 Total assist;From bed Sit to Stand Details: Verbal cues for technique;Verbal cues for precautions/safety;Manual facilitation for weight shifting;Manual facilitation for placement;Manual facilitation for weight bearing Stand to Sit: 1: +1 Total assist Stand to Sit Details (indicate cue type and reason): Verbal cues for technique;Verbal cues for precautions/safety;Manual facilitation for placement;Manual facilitation for weight shifting;Manual facilitation for weight bearing  Trunk/Postural Assessment  Cervical  Assessment Cervical Assessment: Within Functional Limits Thoracic Assessment Thoracic Assessment: Within Functional Limits Lumbar Assessment Lumbar Assessment: Within Functional Limits Postural Control Righting Reactions: Pt still sits in a posterior pelvic tilt with increased lean and pushing to the right side.  Balance Balance Balance Assessed: Yes Static Sitting Balance Static Sitting - Balance Support: Left upper extremity supported;Feet supported Static Sitting - Level of Assistance: 2: Max assist Dynamic Sitting Balance Dynamic Sitting - Balance Support: Feet supported;No upper extremity supported Dynamic Sitting - Level of Assistance: 1: +1 Total assist Static Standing Balance Static Standing - Balance Support: Left upper extremity supported Static Standing - Level of Assistance: 1: +1 Total assist Dynamic Standing Balance Dynamic Standing - Balance Support: Left upper extremity supported Dynamic Standing - Level of Assistance: 1: +1 Total assist Extremity/Trunk Assessment RUE Assessment RUE Assessment: Exceptions to [facility] RUE Strength RUE Overall Strength Comments: Pt with PROM WFLs for all joints of the RUE.  No active movement noted to the hand with pt demonstrating increased pain in the hand and wrist.  Pt with trace shoulder movements, only noted when attempting to bring the right hand over to wash the left arm.  Still needs total assist to put into function. LUE Assessment LUE Assessment: Within Functional Limits  See FIM for current functional status  Renn Stille OTR/L 06/02/2013, 4:06 PM

## 2013-06-02 NOTE — Progress Notes (Signed)
Subjective/Complaints: "don't want therapy" Febrile overnite but temp lower.  BCx2 Pos for GNR, UCx + GNR  Denies SOB, occ cough no dysuria , no other pains Review of Systems - not able to obtain secondary to aphasia (limited) Objective: Vital Signs: Blood pressure 128/65, pulse 80, temperature 98.6 F (37 C), temperature source Oral, resp. rate 18, height $RemoveBe'6\' 5"'pkinoyews$  (1.956 m), weight 83.4 kg (183 lb 13.8 oz), SpO2 97.00%. Dg Chest 2 View  06/01/2013   CLINICAL DATA:  Fever  EXAM: CHEST  2 VIEW  COMPARISON:  05/05/2013  FINDINGS: Prominent cardiomediastinal contours are similar to prior. Elevated hemidiaphragms. No confluent airspace opacity, pleural effusion, pneumothorax. Mild interstitial prominence. Similar to mildly improved since the prior. Anterior fusion cervical spine is partially imaged.  IMPRESSION: Prominent cardiomediastinal contours are similar to prior.  Mild interstitial prominence has improved some in the interval. May reflect mild residual interstitial edema or atypical/viral infection   Electronically Signed   By: Carlos Levering M.D.   On: 06/01/2013 01:38   Results for orders placed during the hospital encounter of 05/06/13 (from the past 72 hour(s))  URINE CULTURE     Status: None   Collection Time    05/31/13  6:40 PM      Result Value Range   Specimen Description URINE, CATHETERIZED     Special Requests NONE     Culture  Setup Time       Value: 05/31/2013 19:39     Performed at [place]       Value: >=100,000 COLONIES/ML     Performed at [facility]   Culture       Value: [place]     Performed at [facility]   Report Status PENDING    CULTURE, BLOOD (ROUTINE X 2)     Status: None   Collection Time    06/01/13  1:12 AM      Result Value Range   Specimen Description BLOOD RIGHT ARM     Special Requests BOTTLES DRAWN AEROBIC AND ANAEROBIC 10CC EACH     Culture  Setup Time       Value: 06/01/2013 08:26   Performed at [facility]   Culture       Value: [place]     Note: Gram Stain Report Called to,Read Back By and Verified With: [facility] [id number] [place]     Performed at [facility]   Report Status PENDING    CULTURE, BLOOD (ROUTINE X 2)     Status: None   Collection Time    06/01/13  1:17 AM      Result Value Range   Specimen Description BLOOD RIGHT HAND     Special Requests BOTTLES DRAWN AEROBIC ONLY 10CC     Culture  Setup Time       Value: 06/01/2013 08:25     Performed at [facility]   Culture       Value: [place]     Note: Gram Stain Report Called to,Read Back By and Verified With: Hortencia Conradi [id number] [place]     Performed at [facility]   Report Status PENDING    BASIC METABOLIC PANEL     Status: Abnormal   Collection Time    06/01/13  1:17 AM      Result Value Range   Sodium 135 (*) 137 - 147 mEq/L   Potassium 4.3  3.7 - 5.3 mEq/L   Chloride 99  96 - 112 mEq/L   CO2 20  19 - 32 mEq/L   Glucose, Bld 117 (*) 70 - 99 mg/dL   BUN 34 (*) 6 - 23 mg/dL   Creatinine, Ser 1.26  0.50 - 1.35 mg/dL   Calcium 9.3  8.4 - 10.5 mg/dL   GFR calc non Af Amer 59 (*) >90 mL/min   GFR calc Af Amer 69 (*) >90 mL/min   Comment: (NOTE)     The eGFR has been calculated using the CKD EPI equation.     This calculation has not been validated in all clinical situations.     eGFR's persistently <90 mL/min signify possible Chronic Kidney     Disease.  CBC WITH DIFFERENTIAL     Status: Abnormal   Collection Time    06/01/13  1:17 AM      Result Value Range   WBC 13.4 (*) 4.0 - 10.5 K/uL   RBC 4.34  4.22 - 5.81 MIL/uL   Hemoglobin 12.6 (*) 13.0 - 17.0 g/dL   HCT 36.4 (*) 39.0 - 52.0 %   MCV 83.9  78.0 - 100.0 fL   MCH 29.0  26.0 - 34.0 pg   MCHC 34.6  30.0 - 36.0 g/dL   RDW 13.0  11.5 - 15.5 %   Platelets 217  150 - 400 K/uL   Neutrophils Relative % 93 (*) 43 - 77 %   Neutro Abs 12.4 (*) 1.7 - 7.7 K/uL    Lymphocytes Relative 4 (*) 12 - 46 %   Lymphs Abs 0.6 (*) 0.7 - 4.0 K/uL   Monocytes Relative 3  3 - 12 %   Monocytes Absolute 0.3  0.1 - 1.0 K/uL   Eosinophils Relative 0  0 - 5 %   Eosinophils Absolute 0.0  0.0 - 0.7 K/uL   Basophils Relative 0  0 - 1 %   Basophils Absolute 0.0  0.0 - 0.1 K/uL  INFLUENZA PANEL BY PCR (TYPE A & B, H1N1)     Status: None   Collection Time    06/01/13  1:20 AM      Result Value Range   Influenza A By PCR NEGATIVE  NEGATIVE   Influenza B By PCR NEGATIVE  NEGATIVE   H1N1 flu by pcr NOT DETECTED  NOT DETECTED   Comment:            The Xpert Flu assay (FDA approved for     nasal aspirates or washes and     nasopharyngeal swab specimens), is     intended as an aid in the diagnosis of     influenza and should not be used as     a sole basis for treatment.      Constitutional: He appears well-developed and well-nourished.  HENT:  Head: Normocephalic and atraumatic.  Eyes: Conjunctivae are normal. Pupils are equal, round, and reactive to light.  Neck: Normal range of motion. Neck supple.  Cardiovascular: Regular rhythm.  Respiratory: Effort normal and breath sounds normal. No respiratory distress. He has no wheezes.   GI: Soft. Bowel sounds are normal. He exhibits no distension. There is no tenderness.  Neurological: He is alert.   Flat affect with delayed processing. Restless and distracted. Minimal verbal output. Dense right hemiparesis with sensory deficits. Moves LUE/LLE with visual/verbal cues. Unable to follow simple commands. RUE and RLE are 0/5. Marland Kitchen Able to only state name, moderate dysarthria Skin: Skin is  warm and dry  MSK , mild pain with ankle dorsiflexion   Assessment/Plan: 1. 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  Elevated temp  UTI, with bacteremia,c and s pending, GNR so can D/C  vanc, ask ID to eval for tx and duration of IV abx,  Hold off on transfer to SNF,should stabilize within 48 hours  FIM: FIM - Bathing Bathing Steps Patient Completed: Chest;Right Arm;Abdomen;Right upper leg;Left upper leg Bathing: 1: Two helpers  FIM - Upper Body Dressing/Undressing Upper body dressing/undressing steps patient completed: Thread/unthread left sleeve of pullover shirt/dress;Pull shirt over trunk Upper body dressing/undressing: 3: Mod-Patient completed 50-74% of tasks FIM - Lower Body Dressing/Undressing Lower body dressing/undressing steps patient completed: Thread/unthread left pants leg Lower body dressing/undressing: 1: Two helpers  FIM - Toileting Toileting: 0: Activity did not occur  FIM - Radio producer Devices: Recruitment consultant Transfers: 0-Activity did not occur  FIM - Control and instrumentation engineer Devices: Arm rests Bed/Chair Transfer: 0: Activity did not occur  FIM - Locomotion: Wheelchair Distance: 100 (per 05/23/13 note) Locomotion: Wheelchair: 0: Activity did not occur FIM - Locomotion: Ambulation Locomotion: Ambulation Assistive Devices: MaxiSky Ambulation/Gait Assistance: 1: +2 Total assist (+2 from 05/23/13) Locomotion: Ambulation: 0: Activity did not occur  Comprehension Comprehension Mode: Auditory Comprehension: 2-Understands basic 25 - 49% of the time/requires cueing 51 - 75% of the time  Expression Expression Mode: Verbal Expression: 2-Expresses basic 25 - 49% of the time/requires cueing 50 - 75% of the time. Uses single words/gestures.  Social Interaction Social Interaction: 3-Interacts appropriately 50 - 74% of the time - May be physically or verbally inappropriate.  Problem Solving Problem Solving: 2-Solves basic 25 - 49% of the time - needs direction more than half the time to initiate, plan or complete simple activities  Memory Memory: 2-Recognizes or recalls 25 - 49% of the  time/requires cueing 51 - 75% of the time  Medical Problem List and Plan:  1. 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  Doppler no carotid stenosis 2. DVT Prophylaxis/Anticoagulation:Right peroneal (calf) DVT Subcutaneous Lovenox initiated 05/05/2013. Monitor platelet counts.   - repeat doppler 1/26 no progression to popliteal vein, lovenox increased to $RemoveBefo'30mg'TAwpfXMThhI$   BID 3. Pain Management: Tylenol as needed , R ankle pain ,R calf DVT and R ankle xray- 4. Neuropsych: This patient is not capable of making decisions on his own behalf.  5. Dysphagia. Dysphagia 1 honey thick liquids. Monitor closely for any aspiration. Followup speech therapy   -on HS IVF, place midline, BUN creat elevated will increase rate esp with temp 6. Hypertension.controlledNorvasc 10 mg daily, hydrochlorothiazide 25 mg daily.  BB 7. OSA. CPAP. Will follow pulmonary services as needed         LOS (Days) 27 A FACE TO FACE EVALUATION WAS PERFORMED  KIRSTEINS,ANDREW E 06/02/2013, 7:00 AM

## 2013-06-02 NOTE — Progress Notes (Signed)
Pharmacy Consult - Levaquin  GNR bacteremia Scr stable  Plan: 1) Levaquin 750 mg iv Q 24 hours for now 2) Change to po in a couple of days.  Thank you. Okey RegalLisa Analayah Brooke, PharmD [phone]

## 2013-06-02 NOTE — Progress Notes (Signed)
Social Work Patient ID: Linna CapriceEdward A Gindlesperger, male   DOB: Jul 05, 1950, 63 y.o.   MRN: [id number] Spoke with Dan-PA who reports pt is not medically ready for transfer to NH, still having labs and I & D to see today.  [facility] to inform [facility].  Will keep her updated and will talk with [name].  Pt has been discharged from therapies as of yesterday.  Will continue to work on discharge plans and hopefully once Medically stable [facility] will [facility] a available bed for him to transfer to.

## 2013-06-03 DIAGNOSIS — I69920 Aphasia following unspecified cerebrovascular disease: Secondary | ICD-10-CM

## 2013-06-03 DIAGNOSIS — N390 Urinary tract infection, site not specified: Secondary | ICD-10-CM

## 2013-06-03 DIAGNOSIS — M109 Gout, unspecified: Secondary | ICD-10-CM

## 2013-06-03 DIAGNOSIS — G8110 Spastic hemiplegia affecting unspecified side: Secondary | ICD-10-CM

## 2013-06-03 DIAGNOSIS — I619 Nontraumatic intracerebral hemorrhage, unspecified: Secondary | ICD-10-CM

## 2013-06-03 LAB — CULTURE, BLOOD (ROUTINE X 2)

## 2013-06-03 LAB — CREATININE, SERUM: CREATININE: 0.88 mg/dL (ref 0.50–1.35)

## 2013-06-03 MED ORDER — LEVOFLOXACIN 750 MG PO TABS
750.0000 mg | ORAL_TABLET | Freq: Every day | ORAL | Status: DC
  Administered 2013-06-03 – 2013-06-05 (×3): 750 mg via ORAL
  Filled 2013-06-03 (×4): qty 1

## 2013-06-03 NOTE — Progress Notes (Signed)
Social Work Patient ID: [name], male   DOB: 01-11-1951, 63 y.o.   MRN: [id number] Met with [name] and her daughter to inform pt will be medically ready for transfer to [facility] center on Monday.  Have contacted [facility] Wynonia Lawman to inform Her of the pt's medically readiness for transfer hopefully Monday.  Will work toward Monday transfer.  Pt is feeling better today and happy family is here to  Visit him.

## 2013-06-03 NOTE — Progress Notes (Signed)
[facility]  Date of Admission:  05/06/2013  Antibiotics: Antibiotics Given (last 72 hours)   Date/Time Action Medication Dose Rate   06/01/13 0143 Given   vancomycin (VANCOCIN) 1,500 mg in sodium chloride 0.9 % 500 mL IVPB 1,500 mg 250 mL/hr   06/01/13 0159 Given   piperacillin-tazobactam (ZOSYN) IVPB 3.375 g 3.375 g 12.5 mL/hr   06/01/13 9562 Given   piperacillin-tazobactam (ZOSYN) IVPB 3.375 g 3.375 g 12.5 mL/hr   06/01/13 1134 Given   vancomycin (VANCOCIN) IVPB 1000 mg/200 mL premix 1,000 mg 200 mL/hr   06/01/13 1529 Given   piperacillin-tazobactam (ZOSYN) IVPB 3.375 g 3.375 g 12.5 mL/hr   06/01/13 2103 Given   piperacillin-tazobactam (ZOSYN) IVPB 3.375 g 3.375 g 12.5 mL/hr   06/01/13 2104 Given   vancomycin (VANCOCIN) IVPB 1000 mg/200 mL premix 1,000 mg 200 mL/hr   06/02/13 0524 Given   piperacillin-tazobactam (ZOSYN) IVPB 3.375 g 3.375 g 12.5 mL/hr   06/02/13 1308 Given   vancomycin (VANCOCIN) IVPB 1000 mg/200 mL premix 1,000 mg 200 mL/hr   06/02/13 1055 Given   ceFEPIme (MAXIPIME) 2 g in dextrose 5 % 50 mL IVPB 2 g 100 mL/hr   06/02/13 1602 Given   levofloxacin (LEVAQUIN) IVPB 750 mg 750 mg 100 mL/hr      Subjective: No fever for 24 hours (100.2 max)  Objective: Temp:  [98.7 F (37.1 C)-100.2 F (37.9 C)] 98.7 F (37.1 C) (01/30 0959) Pulse Rate:  [68-76] 76 (01/30 0535) Resp:  [18-22] 18 (01/30 0535) BP: (137-147)/(78-82) 147/82 mmHg (01/30 0535) SpO2:  [96 %-97 %] 97 % (01/30 0535)  General: alert, nad   Lab Results Lab Results  Component Value Date   WBC 13.4* 06/01/2013   HGB 12.6* 06/01/2013   HCT 36.4* 06/01/2013   MCV 83.9 06/01/2013   PLT 217 06/01/2013    Lab Results  Component Value Date   CREATININE 0.88 06/03/2013   BUN 34* 06/01/2013   NA 135* 06/01/2013   K 4.3 06/01/2013   CL 99 06/01/2013   CO2 20 06/01/2013    Lab Results  Component Value Date   ALT 70* 05/09/2013   AST 33 05/09/2013   ALKPHOS 95 05/09/2013   BILITOT 1.1 05/09/2013      Microbiology: Recent Results (from the past 240 hour(s))  URINE CULTURE     Status: None   Collection Time    05/31/13  6:40 PM      Result Value Range Status   Specimen Description URINE, CATHETERIZED   Final   Special Requests NONE   Final   Culture  Setup Time     Final   Value: 05/31/2013 19:39     Performed at [facility] Count     Final   Value: >=100,000 COLONIES/ML     Performed at [facility]   Culture     Final   Value: KLEBSIELLA PNEUMONIAE     Performed at [facility]   Report Status 06/02/2013 FINAL   Final   Organism ID, Bacteria KLEBSIELLA PNEUMONIAE   Final  CULTURE, BLOOD (ROUTINE X 2)     Status: None   Collection Time    06/01/13  1:12 AM      Result Value Range Status   Specimen Description BLOOD RIGHT ARM   Final   Special Requests BOTTLES DRAWN AEROBIC AND ANAEROBIC 10CC EACH   Final   Culture  Setup Time  Final   Value: 06/01/2013 08:26     [facility]   Culture     Final   Value: KLEBSIELLA PNEUMONIAE     Note: SUSCEPTIBILITIES PERFORMED ON PREVIOUS CULTURE WITHIN THE LAST 5 DAYS.     Note: Gram Stain Report Called to,Read Back By and Verified With: [facility] ROYAL [id number] [id number] BRMEL     [facility]   Report Status 06/03/2013 FINAL   Final  CULTURE, BLOOD (ROUTINE X 2)     Status: None   Collection Time    06/01/13  1:17 AM      Result Value Range Status   Specimen Description BLOOD RIGHT HAND   Final   Special Requests BOTTLES DRAWN AEROBIC ONLY 10CC   Final   Culture  Setup Time     Final   Value: 06/01/2013 08:25     [facility]   Culture     Final   Value: KLEBSIELLA PNEUMONIAE     Note: Gram Stain Report Called to,Read Back By and Verified With: Mick SellSHANNON AMBURN [id number] [id number] BRM[place]     [facility]   Report Status 06/03/2013 FINAL   Final   Organism ID, Bacteria KLEBSIELLA PNEUMONIAE    Final    Studies/Results: No results found.  Assessment/Plan: 1) Complicated UTI - now afebrile.  Blood cultures match urine culture and fluoroquinolone sensitive.  On levaquin.  Can change to po levaquin 750 mg and treat 5 more days including today's afternoon dose.   -from an ID standpoint he can transfer to SNF anytime  Staci RighterOMER, Hosteen Kienast, MD [facility] [email or web address] [phone] pager   [phone] cell 06/03/2013, 11:17 AM

## 2013-06-03 NOTE — Progress Notes (Signed)
Subjective/Complaints: Brighter and more talkative today" Febrile overnite   BCx2 Pos for GNR, UCx + Kleb, sens to multiple oral meds, per ID ok for levaquin  Denies SOB, occ cough no dysuria , no other pains Review of Systems - not able to obtain secondary to aphasia (limited) Objective: Vital Signs: Blood pressure 147/82, pulse 76, temperature 98.9 F (37.2 C), temperature source Oral, resp. rate 18, height 6' 5" (1.956 m), weight 83.4 kg (183 lb 13.8 oz), SpO2 97.00%. No results found. Results for orders placed during the hospital encounter of 05/06/13 (from the past 72 hour(s))  URINE CULTURE     Status: None   Collection Time    05/31/13  6:40 PM      Result Value Range   Specimen Description URINE, CATHETERIZED     Special Requests NONE     Culture  Setup Time       Value: 05/31/2013 19:39     Performed at [facility]   [place] Count       Value: >=100,000 COLONIES/ML     Performed at [facility]   Culture       Value: KLEBSIELLA PNEUMONIAE     Performed at [facility]   Report Status 06/02/2013 FINAL     Organism ID, Bacteria KLEBSIELLA PNEUMONIAE    CULTURE, BLOOD (ROUTINE X 2)     Status: None   Collection Time    06/01/13  1:12 AM      Result Value Range   Specimen Description BLOOD RIGHT ARM     Special Requests BOTTLES DRAWN AEROBIC AND ANAEROBIC 10CC EACH     Culture  Setup Time       Value: 06/01/2013 08:26     Performed at [facility]   Culture       Value: GRAM NEGATIVE RODS     Note: Gram Stain Report Called to,Read Back By and Verified With: ASHLEY ROYAL [id number] [id number] BRMEL     Performed at [facility]   Report Status PENDING    CULTURE, BLOOD (ROUTINE X 2)     Status: None   Collection Time    06/01/13  1:17 AM      Result Value Range   Specimen Description BLOOD RIGHT HAND     Special Requests BOTTLES DRAWN AEROBIC ONLY 10CC     Culture  Setup Time       Value: 06/01/2013 08:25     Performed at  [facility]   Culture       Value: GRAM NEGATIVE RODS     Note: Gram Stain Report Called to,Read Back By and Verified With: SHANNON AMBURN [id number] [id number] BRMEL     Performed at [facility]   Report Status PENDING    BASIC METABOLIC PANEL     Status: Abnormal   Collection Time    06/01/13  1:17 AM      Result Value Range   Sodium 135 (*) 137 - 147 mEq/L   Potassium 4.3  3.7 - 5.3 mEq/L   Chloride 99  96 - 112 mEq/L   CO2 20  19 - 32 mEq/L   Glucose, Bld 117 (*) 70 - 99 mg/dL   BUN 34 (*) 6 - 23 mg/dL   Creatinine, Ser 1.26  0.50 - 1.35 mg/dL   Calcium 9.3  8.4 - 10.5 mg/dL   GFR calc non Af Amer 59 (*) >90 mL/min   GFR calc Af Amer 69 (*) >  90 mL/min   Comment: (NOTE)     The eGFR has been calculated using the CKD EPI equation.     This calculation has not been validated in all clinical situations.     eGFR's persistently <90 mL/min signify possible Chronic Kidney     Disease.  CBC WITH DIFFERENTIAL     Status: Abnormal   Collection Time    06/01/13  1:17 AM      Result Value Range   WBC 13.4 (*) 4.0 - 10.5 K/uL   RBC 4.34  4.22 - 5.81 MIL/uL   Hemoglobin 12.6 (*) 13.0 - 17.0 g/dL   HCT 36.4 (*) 39.0 - 52.0 %   MCV 83.9  78.0 - 100.0 fL   MCH 29.0  26.0 - 34.0 pg   MCHC 34.6  30.0 - 36.0 g/dL   RDW 13.0  11.5 - 15.5 %   Platelets 217  150 - 400 K/uL   Neutrophils Relative % 93 (*) 43 - 77 %   Neutro Abs 12.4 (*) 1.7 - 7.7 K/uL   Lymphocytes Relative 4 (*) 12 - 46 %   Lymphs Abs 0.6 (*) 0.7 - 4.0 K/uL   Monocytes Relative 3  3 - 12 %   Monocytes Absolute 0.3  0.1 - 1.0 K/uL   Eosinophils Relative 0  0 - 5 %   Eosinophils Absolute 0.0  0.0 - 0.7 K/uL   Basophils Relative 0  0 - 1 %   Basophils Absolute 0.0  0.0 - 0.1 K/uL  INFLUENZA PANEL BY PCR (TYPE A & B, H1N1)     Status: None   Collection Time    06/01/13  1:20 AM      Result Value Range   Influenza A By PCR NEGATIVE  NEGATIVE   Influenza B By PCR NEGATIVE  NEGATIVE   H1N1 flu by pcr NOT  DETECTED  NOT DETECTED   Comment:            The Xpert Flu assay (FDA approved for     nasal aspirates or washes and     nasopharyngeal swab specimens), is     intended as an aid in the diagnosis of     influenza and should not be used as     a sole basis for treatment.      Constitutional: He appears well-developed and well-nourished.  HENT:  Head: Normocephalic and atraumatic.  Eyes: Conjunctivae are normal. Pupils are equal, round, and reactive to light.  Neck: Normal range of motion. Neck supple.  Cardiovascular: Regular rhythm.  Respiratory: Effort normal and breath sounds normal. No respiratory distress. He has no wheezes.   GI: Soft. Bowel sounds are normal. He exhibits no distension. There is no tenderness.  Neurological: He is alert.   Flat affect with delayed processing. Restless and distracted. Minimal verbal output. Dense right hemiparesis with sensory deficits. Moves LUE/LLE with visual/verbal cues. Unable to follow simple commands. RUE and RLE are 0/5. Marland Kitchen Able to only state name, severe exp aphasia moderate dysarthria Skin: Skin is warm and dry  MSK , mild pain with ankle dorsiflexion   Assessment/Plan: 1. 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  UTI with bacteremia , now afebrile on po abx Ask ID whether pt ok for SNF transfer Will await BC results to make sure Klebsiella in urine matches  FIM: FIM - Bathing  Bathing Steps Patient Completed: Chest;Right Arm;Abdomen;Right upper leg;Left upper leg Bathing: 1: Two helpers  FIM - Upper Body Dressing/Undressing Upper body dressing/undressing steps patient completed: Thread/unthread left sleeve of pullover shirt/dress;Pull shirt over trunk Upper body dressing/undressing: 3: Mod-Patient completed 50-74% of tasks FIM - Lower Body Dressing/Undressing Lower body  dressing/undressing steps patient completed: Thread/unthread left pants leg Lower body dressing/undressing: 1: Two helpers  FIM - Toileting Toileting: 0: Activity did not occur  FIM - Radio producer Devices: Recruitment consultant Transfers: 0-Activity did not occur  FIM - Control and instrumentation engineer Devices: Arm rests Bed/Chair Transfer: 0: Activity did not occur  FIM - Locomotion: Wheelchair Distance: 100 (per 05/23/13 note) Locomotion: Wheelchair: 0: Activity did not occur FIM - Locomotion: Ambulation Locomotion: Ambulation Assistive Devices: MaxiSky Ambulation/Gait Assistance: 1: +2 Total assist (+2 from 05/23/13) Locomotion: Ambulation: 0: Activity did not occur  Comprehension Comprehension Mode: Auditory Comprehension: 2-Understands basic 25 - 49% of the time/requires cueing 51 - 75% of the time  Expression Expression Mode: Verbal Expression: 2-Expresses basic 25 - 49% of the time/requires cueing 50 - 75% of the time. Uses single words/gestures.  Social Interaction Social Interaction: 3-Interacts appropriately 50 - 74% of the time - May be physically or verbally inappropriate.  Problem Solving Problem Solving: 2-Solves basic 25 - 49% of the time - needs direction more than half the time to initiate, plan or complete simple activities  Memory Memory: 2-Recognizes or recalls 25 - 49% of the time/requires cueing 51 - 75% of the time  Medical Problem List and Plan:  1. 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  Doppler no carotid stenosis 2. DVT Prophylaxis/Anticoagulation:Right peroneal (calf) DVT Subcutaneous Lovenox initiated 05/05/2013. Monitor platelet counts.   - repeat doppler 1/26 no progression to popliteal vein, lovenox increased to 73m  BID 3. Pain Management: Tylenol as needed , R ankle pain ,R calf DVT and R ankle xray- 4. Neuropsych: This patient is not capable of making  decisions on his own behalf.  5. Dysphagia. Dysphagia 1 honey thick liquids. Monitor closely for any aspiration. Followup speech therapy   -on HS IVF, place midline, BUN creat elevated will increase rate esp with temp 6. Hypertension.controlledNorvasc 10 mg daily, hydrochlorothiazide 25 mg daily.  BB 7. OSA. CPAP. Will follow pulmonary services as needed         LOS (Days) 28 A FACE TO FACE EVALUATION WAS PERFORMED  Yuval Nolet E 06/03/2013, 6:59 AM

## 2013-06-03 NOTE — Progress Notes (Signed)
Attempted to get pt OOB to chair after bath. Pt refuses. States" it hurts too much" . [name], [name]

## 2013-06-04 NOTE — Progress Notes (Signed)
  Subjective/Complaints: Patient denies pain. It's difficult to get any other history from him. He is able to speak but it is frequently nonsensical.   Review of Systems - not able to obtain secondary to aphasia (limited) Objective: Vital Signs: Blood pressure 148/79, pulse 66, temperature 98.1 F (36.7 C), temperature source Oral, resp. rate 17, height 6\' 5"  (1.956 m), weight 180 lb 1.6 oz (81.693 kg), SpO2 97.00%.  Overweight male in no acute distress. Chest clear to auscultation. Cardiac exam S1-S2 are regular. Abdominal exam active bowel sounds, soft. Extremities no edema. Assessment/Plan: 1. Functional deficits secondary to Left MCA infarct, R flaccid hemiplegia, aphasia, dysphagia   Medical Problem List and Plan:  1. 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  Doppler no carotid stenosis 2. DVT Prophylaxis/Anticoagulation:Right peroneal (calf) DVT Subcutaneous Lovenox initiated 05/05/2013. Monitor platelet counts.   - repeat doppler 1/26 no progression to popliteal vein, lovenox increased to 30mg   BID 3. Pain Management: Tylenol as needed , R ankle pain ,R calf DVT and R ankle xray- 4. Neuropsych: This patient is not capable of making decisions on his own behalf.  5. Dysphagia. Dysphagia 1 honey thick liquids. Monitor closely for any aspiration. Followup speech therapy    6. Hypertension.controlledNorvasc 10 mg daily, hydrochlorothiazide 25 mg daily.  Blood pressure ranged from 118-140/67-79. 7. OSA. CPAP. Will follow pulmonary services as needed  8. UTI with bacteremia. Reviewed infectious disease note. Patient now on Levaquin, afebrile. Last dose of Levaquin scheduled for June 17, 2013        LOS (Days) 29 A FACE TO FACE EVALUATION WAS PERFORMED  SWORDS,BRUCE HENRY 06/04/2013, 9:21 AM

## 2013-06-05 NOTE — Progress Notes (Signed)
  Subjective/Complaints: No pain Has a peripheral IV and a PICC He is confused- and wants to go home.    Review of Systems - pressured speech  Objective: Vital Signs: Blood pressure 153/81, pulse 68, temperature 98.1 F (36.7 C), temperature source Oral, resp. rate 19, height 6\' 5"  (1.956 m), weight 180 lb 1.6 oz (81.693 kg), SpO2 94.00%.  Overweight male in no acute distress. Chest clear to auscultation. Cardiac exam S1-S2 are regular. Abdominal exam active bowel sounds, soft. Extremities no edema. Assessment/Plan: 1. Functional deficits secondary to Left MCA infarct, R flaccid hemiplegia, aphasia, dysphagia   Medical Problem List and Plan:  1. 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  Doppler no carotid stenosis 2. DVT Prophylaxis/Anticoagulation:Right peroneal (calf) DVT Subcutaneous Lovenox initiated 05/05/2013. Monitor platelet counts.   - repeat doppler 1/26 no progression to popliteal vein, lovenox increased to 30mg   BID 3. Pain Management: Tylenol as needed  4. Neuropsych: This patient is not capable of making decisions on his own behalf.  5. Dysphagia. Dysphagia 1 honey thick liquids. Monitor closely for any aspiration. Followup speech therapy    6. Hypertension.controlledNorvasc 10 mg daily, hydrochlorothiazide 25 mg daily.   7. OSA. CPAP. Will follow pulmonary services as needed  8. UTI with bacteremia. Reviewed infectious disease note. Patient now on Levaquin, afebrile. Last dose of Levaquin scheduled for June 07, 2013        LOS (Days) 30 A FACE TO FACE EVALUATION WAS PERFORMED  [name] HENRY 06/05/2013, 9:59 AM

## 2013-06-06 DIAGNOSIS — M109 Gout, unspecified: Secondary | ICD-10-CM

## 2013-06-06 DIAGNOSIS — I10 Essential (primary) hypertension: Secondary | ICD-10-CM | POA: Diagnosis not present

## 2013-06-06 DIAGNOSIS — G459 Transient cerebral ischemic attack, unspecified: Secondary | ICD-10-CM | POA: Diagnosis not present

## 2013-06-06 DIAGNOSIS — G4733 Obstructive sleep apnea (adult) (pediatric): Secondary | ICD-10-CM | POA: Diagnosis not present

## 2013-06-06 DIAGNOSIS — R0602 Shortness of breath: Secondary | ICD-10-CM | POA: Diagnosis not present

## 2013-06-06 DIAGNOSIS — M25569 Pain in unspecified knee: Secondary | ICD-10-CM | POA: Diagnosis not present

## 2013-06-06 DIAGNOSIS — G8110 Spastic hemiplegia affecting unspecified side: Secondary | ICD-10-CM | POA: Diagnosis not present

## 2013-06-06 DIAGNOSIS — F329 Major depressive disorder, single episode, unspecified: Secondary | ICD-10-CM | POA: Diagnosis present

## 2013-06-06 DIAGNOSIS — N10 Acute tubulo-interstitial nephritis: Secondary | ICD-10-CM

## 2013-06-06 DIAGNOSIS — M545 Low back pain, unspecified: Secondary | ICD-10-CM | POA: Diagnosis present

## 2013-06-06 DIAGNOSIS — N390 Urinary tract infection, site not specified: Secondary | ICD-10-CM | POA: Diagnosis not present

## 2013-06-06 DIAGNOSIS — R0902 Hypoxemia: Secondary | ICD-10-CM | POA: Diagnosis not present

## 2013-06-06 DIAGNOSIS — I619 Nontraumatic intracerebral hemorrhage, unspecified: Secondary | ICD-10-CM | POA: Diagnosis not present

## 2013-06-06 DIAGNOSIS — I679 Cerebrovascular disease, unspecified: Secondary | ICD-10-CM | POA: Diagnosis not present

## 2013-06-06 DIAGNOSIS — G936 Cerebral edema: Secondary | ICD-10-CM | POA: Diagnosis not present

## 2013-06-06 DIAGNOSIS — F3289 Other specified depressive episodes: Secondary | ICD-10-CM | POA: Diagnosis present

## 2013-06-06 DIAGNOSIS — J189 Pneumonia, unspecified organism: Secondary | ICD-10-CM | POA: Diagnosis not present

## 2013-06-06 DIAGNOSIS — G8100 Flaccid hemiplegia affecting unspecified side: Secondary | ICD-10-CM | POA: Diagnosis not present

## 2013-06-06 DIAGNOSIS — R0989 Other specified symptoms and signs involving the circulatory and respiratory systems: Secondary | ICD-10-CM | POA: Diagnosis not present

## 2013-06-06 DIAGNOSIS — I824Z9 Acute embolism and thrombosis of unspecified deep veins of unspecified distal lower extremity: Secondary | ICD-10-CM | POA: Diagnosis not present

## 2013-06-06 DIAGNOSIS — I119 Hypertensive heart disease without heart failure: Secondary | ICD-10-CM | POA: Diagnosis not present

## 2013-06-06 DIAGNOSIS — Z8042 Family history of malignant neoplasm of prostate: Secondary | ICD-10-CM | POA: Diagnosis not present

## 2013-06-06 DIAGNOSIS — G4730 Sleep apnea, unspecified: Secondary | ICD-10-CM | POA: Diagnosis not present

## 2013-06-06 DIAGNOSIS — K449 Diaphragmatic hernia without obstruction or gangrene: Secondary | ICD-10-CM | POA: Diagnosis present

## 2013-06-06 DIAGNOSIS — Z79899 Other long term (current) drug therapy: Secondary | ICD-10-CM | POA: Diagnosis not present

## 2013-06-06 DIAGNOSIS — Z5189 Encounter for other specified aftercare: Secondary | ICD-10-CM | POA: Diagnosis not present

## 2013-06-06 DIAGNOSIS — J9600 Acute respiratory failure, unspecified whether with hypoxia or hypercapnia: Secondary | ICD-10-CM | POA: Diagnosis present

## 2013-06-06 DIAGNOSIS — R1310 Dysphagia, unspecified: Secondary | ICD-10-CM | POA: Diagnosis not present

## 2013-06-06 DIAGNOSIS — I82629 Acute embolism and thrombosis of deep veins of unspecified upper extremity: Secondary | ICD-10-CM | POA: Diagnosis not present

## 2013-06-06 DIAGNOSIS — R7881 Bacteremia: Secondary | ICD-10-CM | POA: Diagnosis not present

## 2013-06-06 DIAGNOSIS — Z880 Allergy status to penicillin: Secondary | ICD-10-CM | POA: Diagnosis not present

## 2013-06-06 DIAGNOSIS — Z888 Allergy status to other drugs, medicaments and biological substances status: Secondary | ICD-10-CM | POA: Diagnosis not present

## 2013-06-06 DIAGNOSIS — I69920 Aphasia following unspecified cerebrovascular disease: Secondary | ICD-10-CM

## 2013-06-06 DIAGNOSIS — Z7902 Long term (current) use of antithrombotics/antiplatelets: Secondary | ICD-10-CM | POA: Diagnosis not present

## 2013-06-06 DIAGNOSIS — I69959 Hemiplegia and hemiparesis following unspecified cerebrovascular disease affecting unspecified side: Secondary | ICD-10-CM | POA: Diagnosis not present

## 2013-06-06 DIAGNOSIS — I629 Nontraumatic intracranial hemorrhage, unspecified: Secondary | ICD-10-CM | POA: Diagnosis not present

## 2013-06-06 DIAGNOSIS — K219 Gastro-esophageal reflux disease without esophagitis: Secondary | ICD-10-CM | POA: Diagnosis not present

## 2013-06-06 DIAGNOSIS — G8929 Other chronic pain: Secondary | ICD-10-CM | POA: Diagnosis present

## 2013-06-06 DIAGNOSIS — G8190 Hemiplegia, unspecified affecting unspecified side: Secondary | ICD-10-CM | POA: Diagnosis not present

## 2013-06-06 DIAGNOSIS — M19049 Primary osteoarthritis, unspecified hand: Secondary | ICD-10-CM | POA: Diagnosis not present

## 2013-06-06 MED ORDER — HEPARIN SOD (PORK) LOCK FLUSH 100 UNIT/ML IV SOLN
250.0000 [IU] | INTRAVENOUS | Status: AC | PRN
  Administered 2013-06-06: 250 [IU]

## 2013-06-06 NOTE — Progress Notes (Signed)
Social Work Discharge Note Discharge Note  The overall goal for the admission was met for:   Discharge location: No-[facility]  Length of Stay: No-31 DAYS  Discharge activity level: No-MOD LEVEL  Home/community participation: No  Services provided included: MD, RD, PT, OT, SLP, RN, CM, TR, Pharmacy and SW  Financial Services: Medicare and Medicaid  Follow-up services arranged: Other: NHP  Comments (or additional information):MEDICAL ISSUES CAUSED LONGER LENGTH OF STAY FOR PT  Patient/Family verbalized understanding of follow-up arrangements: Yes  Individual responsible for coordination of the follow-up plan: [name]-GIRLFRIEND & SANDY-SISTER  Confirmed correct DME delivered: Elease Hashimoto 06/06/2013    Elease Hashimoto

## 2013-06-06 NOTE — Progress Notes (Signed)
Subjective/Complaints: Appreciate ID note Pt concerned about finances at home  Denies SOB, occ cough no dysuria , no other pains Review of Systems - not able to obtain secondary to aphasia (limited) Objective: Vital Signs: Blood pressure 143/76, pulse 65, temperature 98.1 F (36.7 C), temperature source Oral, resp. rate 18, height 6\' 5"  (1.956 m), weight 81.693 kg (180 lb 1.6 oz), SpO2 94.00%. No results found. No results found for this or any previous visit (from the past 72 hour(s)).    Constitutional: He appears well-developed and well-nourished.  HENT:  Head: Normocephalic and atraumatic.  Eyes: Conjunctivae are normal. Pupils are equal, round, and reactive to light.  Neck: Normal range of motion. Neck supple.  Cardiovascular: Regular rhythm.  Respiratory: Effort normal and breath sounds normal. No respiratory distress. He has no wheezes.   GI: Soft. Bowel sounds are normal. He exhibits no distension. There is no tenderness.  Neurological: He is alert.   Flat affect with delayed processing. Restless and distracted. Minimal verbal output. Dense right hemiparesis with sensory deficits. Moves LUE/LLE with visual/verbal cues. Unable to follow simple commands. RUE and RLE are 0/5. Marland Kitchen Able to only state name, severe exp aphasia moderate dysarthria Skin: Skin is warm and dry  MSK , mild pain with ankle dorsiflexion   Assessment/Plan: 1. 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  UTI with bacteremia , now afebrile on po abx  ok for SNF transfer  BC  And UCx showing Kleb, PO Levaquin  FIM: FIM - Bathing Bathing Steps Patient Completed: Chest;Right Arm;Abdomen;Right upper leg;Left upper leg Bathing: 1: Two helpers  FIM - Upper Body Dressing/Undressing Upper body dressing/undressing steps patient completed: Thread/unthread left  sleeve of pullover shirt/dress;Pull shirt over trunk Upper body dressing/undressing: 3: Mod-Patient completed 50-74% of tasks FIM - Lower Body Dressing/Undressing Lower body dressing/undressing steps patient completed: Thread/unthread left pants leg Lower body dressing/undressing: 1: Two helpers  FIM - Toileting Toileting: 0: Activity did not occur  FIM - Diplomatic Services operational officer Devices: Psychiatrist Transfers: 0-Activity did not occur  FIM - Banker Devices: Arm rests Bed/Chair Transfer: 0: Activity did not occur  FIM - Locomotion: Wheelchair Distance: 100 (per 05/23/13 note) Locomotion: Wheelchair: 0: Activity did not occur FIM - Locomotion: Ambulation Locomotion: Ambulation Assistive Devices: MaxiSky Ambulation/Gait Assistance: 1: +2 Total assist (+2 from 05/23/13) Locomotion: Ambulation: 0: Activity did not occur  Comprehension Comprehension Mode: Auditory Comprehension: 2-Understands basic 25 - 49% of the time/requires cueing 51 - 75% of the time  Expression Expression Mode: Verbal Expression: 2-Expresses basic 25 - 49% of the time/requires cueing 50 - 75% of the time. Uses single words/gestures.  Social Interaction Social Interaction: 3-Interacts appropriately 50 - 74% of the time - May be physically or verbally inappropriate.  Problem Solving Problem Solving: 2-Solves basic 25 - 49% of the time - needs direction more than half the time to initiate, plan or complete simple activities  Memory Memory: 2-Recognizes or recalls 25 - 49% of the time/requires cueing 51 - 75% of the time  Medical Problem List and Plan:  1. 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  Doppler no carotid stenosis 2. DVT Prophylaxis/Anticoagulation:Right peroneal (calf) DVT Subcutaneous Lovenox initiated 05/05/2013. Monitor platelet counts.   - repeat doppler 1/26 no progression to popliteal  vein, lovenox increased to 30mg   BID 3.  Pain Management: Tylenol as needed , R ankle pain ,R calf DVT and R ankle xray- 4. Neuropsych: This patient is not capable of making decisions on his own behalf.  5. Dysphagia. Dysphagia 1 honey thick liquids. Monitor closely for any aspiration. Followup speech therapy   -on HS IVF, place midline, BUN creat elevated will increase rate esp with temp 6. Hypertension.controlledNorvasc 10 mg daily, hydrochlorothiazide 25 mg daily.  BB 7. OSA. CPAP. Will follow pulmonary services as needed         LOS (Days) 31 A FACE TO FACE EVALUATION WAS PERFORMED  Nataki Mccrumb E 06/06/2013, 8:05 AM

## 2013-06-07 DIAGNOSIS — I679 Cerebrovascular disease, unspecified: Secondary | ICD-10-CM | POA: Diagnosis not present

## 2013-06-07 DIAGNOSIS — I82629 Acute embolism and thrombosis of deep veins of unspecified upper extremity: Secondary | ICD-10-CM | POA: Diagnosis not present

## 2013-06-07 DIAGNOSIS — I10 Essential (primary) hypertension: Secondary | ICD-10-CM | POA: Diagnosis not present

## 2013-06-07 DIAGNOSIS — G4730 Sleep apnea, unspecified: Secondary | ICD-10-CM | POA: Diagnosis not present

## 2013-07-12 DIAGNOSIS — I679 Cerebrovascular disease, unspecified: Secondary | ICD-10-CM | POA: Diagnosis not present

## 2013-07-12 DIAGNOSIS — G4730 Sleep apnea, unspecified: Secondary | ICD-10-CM | POA: Diagnosis not present

## 2013-07-12 DIAGNOSIS — I10 Essential (primary) hypertension: Secondary | ICD-10-CM | POA: Diagnosis not present

## 2013-08-17 DIAGNOSIS — M19049 Primary osteoarthritis, unspecified hand: Secondary | ICD-10-CM | POA: Diagnosis not present

## 2013-08-17 DIAGNOSIS — M25569 Pain in unspecified knee: Secondary | ICD-10-CM | POA: Diagnosis not present

## 2013-08-22 DIAGNOSIS — Z7401 Bed confinement status: Secondary | ICD-10-CM | POA: Diagnosis not present

## 2013-08-22 DIAGNOSIS — F329 Major depressive disorder, single episode, unspecified: Secondary | ICD-10-CM | POA: Diagnosis present

## 2013-08-22 DIAGNOSIS — Z8042 Family history of malignant neoplasm of prostate: Secondary | ICD-10-CM | POA: Diagnosis not present

## 2013-08-22 DIAGNOSIS — R1310 Dysphagia, unspecified: Secondary | ICD-10-CM | POA: Diagnosis not present

## 2013-08-22 DIAGNOSIS — K219 Gastro-esophageal reflux disease without esophagitis: Secondary | ICD-10-CM | POA: Diagnosis not present

## 2013-08-22 DIAGNOSIS — Z7902 Long term (current) use of antithrombotics/antiplatelets: Secondary | ICD-10-CM | POA: Diagnosis not present

## 2013-08-22 DIAGNOSIS — F3289 Other specified depressive episodes: Secondary | ICD-10-CM | POA: Diagnosis present

## 2013-08-22 DIAGNOSIS — M109 Gout, unspecified: Secondary | ICD-10-CM | POA: Diagnosis present

## 2013-08-22 DIAGNOSIS — I824Z9 Acute embolism and thrombosis of unspecified deep veins of unspecified distal lower extremity: Secondary | ICD-10-CM | POA: Diagnosis not present

## 2013-08-22 DIAGNOSIS — Z888 Allergy status to other drugs, medicaments and biological substances status: Secondary | ICD-10-CM | POA: Diagnosis not present

## 2013-08-22 DIAGNOSIS — R0602 Shortness of breath: Secondary | ICD-10-CM | POA: Diagnosis not present

## 2013-08-22 DIAGNOSIS — M545 Low back pain, unspecified: Secondary | ICD-10-CM | POA: Diagnosis not present

## 2013-08-22 DIAGNOSIS — M2550 Pain in unspecified joint: Secondary | ICD-10-CM | POA: Diagnosis not present

## 2013-08-22 DIAGNOSIS — G4733 Obstructive sleep apnea (adult) (pediatric): Secondary | ICD-10-CM | POA: Diagnosis not present

## 2013-08-22 DIAGNOSIS — I69959 Hemiplegia and hemiparesis following unspecified cerebrovascular disease affecting unspecified side: Secondary | ICD-10-CM | POA: Diagnosis not present

## 2013-08-22 DIAGNOSIS — Z79899 Other long term (current) drug therapy: Secondary | ICD-10-CM | POA: Diagnosis not present

## 2013-08-22 DIAGNOSIS — K449 Diaphragmatic hernia without obstruction or gangrene: Secondary | ICD-10-CM | POA: Diagnosis present

## 2013-08-22 DIAGNOSIS — J9600 Acute respiratory failure, unspecified whether with hypoxia or hypercapnia: Secondary | ICD-10-CM | POA: Diagnosis not present

## 2013-08-22 DIAGNOSIS — G8929 Other chronic pain: Secondary | ICD-10-CM | POA: Diagnosis present

## 2013-08-22 DIAGNOSIS — I619 Nontraumatic intracerebral hemorrhage, unspecified: Secondary | ICD-10-CM | POA: Diagnosis not present

## 2013-08-22 DIAGNOSIS — R0902 Hypoxemia: Secondary | ICD-10-CM | POA: Diagnosis not present

## 2013-08-22 DIAGNOSIS — I10 Essential (primary) hypertension: Secondary | ICD-10-CM | POA: Diagnosis not present

## 2013-08-22 DIAGNOSIS — J189 Pneumonia, unspecified organism: Secondary | ICD-10-CM | POA: Diagnosis not present

## 2013-08-22 DIAGNOSIS — Z880 Allergy status to penicillin: Secondary | ICD-10-CM | POA: Diagnosis not present

## 2013-08-22 DIAGNOSIS — R0989 Other specified symptoms and signs involving the circulatory and respiratory systems: Secondary | ICD-10-CM | POA: Diagnosis not present

## 2013-08-25 DIAGNOSIS — B351 Tinea unguium: Secondary | ICD-10-CM | POA: Diagnosis not present

## 2013-08-25 DIAGNOSIS — F3289 Other specified depressive episodes: Secondary | ICD-10-CM | POA: Diagnosis not present

## 2013-08-25 DIAGNOSIS — R1310 Dysphagia, unspecified: Secondary | ICD-10-CM | POA: Diagnosis not present

## 2013-08-25 DIAGNOSIS — G8190 Hemiplegia, unspecified affecting unspecified side: Secondary | ICD-10-CM | POA: Diagnosis not present

## 2013-08-25 DIAGNOSIS — I10 Essential (primary) hypertension: Secondary | ICD-10-CM | POA: Diagnosis not present

## 2013-08-25 DIAGNOSIS — I824Z9 Acute embolism and thrombosis of unspecified deep veins of unspecified distal lower extremity: Secondary | ICD-10-CM | POA: Diagnosis not present

## 2013-08-25 DIAGNOSIS — K219 Gastro-esophageal reflux disease without esophagitis: Secondary | ICD-10-CM | POA: Diagnosis not present

## 2013-08-25 DIAGNOSIS — G4733 Obstructive sleep apnea (adult) (pediatric): Secondary | ICD-10-CM | POA: Diagnosis not present

## 2013-08-25 DIAGNOSIS — Z7401 Bed confinement status: Secondary | ICD-10-CM | POA: Diagnosis not present

## 2013-08-25 DIAGNOSIS — I619 Nontraumatic intracerebral hemorrhage, unspecified: Secondary | ICD-10-CM | POA: Diagnosis not present

## 2013-08-25 DIAGNOSIS — M2550 Pain in unspecified joint: Secondary | ICD-10-CM | POA: Diagnosis not present

## 2013-08-25 DIAGNOSIS — N390 Urinary tract infection, site not specified: Secondary | ICD-10-CM | POA: Diagnosis not present

## 2013-08-25 DIAGNOSIS — M545 Low back pain, unspecified: Secondary | ICD-10-CM | POA: Diagnosis not present

## 2013-08-25 DIAGNOSIS — J9600 Acute respiratory failure, unspecified whether with hypoxia or hypercapnia: Secondary | ICD-10-CM | POA: Diagnosis not present

## 2013-08-25 DIAGNOSIS — M79609 Pain in unspecified limb: Secondary | ICD-10-CM | POA: Diagnosis not present

## 2013-08-25 DIAGNOSIS — I69959 Hemiplegia and hemiparesis following unspecified cerebrovascular disease affecting unspecified side: Secondary | ICD-10-CM | POA: Diagnosis not present

## 2013-08-25 DIAGNOSIS — M109 Gout, unspecified: Secondary | ICD-10-CM | POA: Diagnosis not present

## 2013-08-25 DIAGNOSIS — J189 Pneumonia, unspecified organism: Secondary | ICD-10-CM | POA: Diagnosis not present

## 2013-08-25 DIAGNOSIS — F329 Major depressive disorder, single episode, unspecified: Secondary | ICD-10-CM | POA: Diagnosis not present

## 2013-09-19 DIAGNOSIS — I69991 Dysphagia following unspecified cerebrovascular disease: Secondary | ICD-10-CM | POA: Diagnosis not present

## 2013-09-19 DIAGNOSIS — I69998 Other sequelae following unspecified cerebrovascular disease: Secondary | ICD-10-CM | POA: Diagnosis not present

## 2013-09-20 DIAGNOSIS — I69998 Other sequelae following unspecified cerebrovascular disease: Secondary | ICD-10-CM | POA: Diagnosis not present

## 2013-09-20 DIAGNOSIS — I69991 Dysphagia following unspecified cerebrovascular disease: Secondary | ICD-10-CM | POA: Diagnosis not present

## 2013-09-21 DIAGNOSIS — I69998 Other sequelae following unspecified cerebrovascular disease: Secondary | ICD-10-CM | POA: Diagnosis not present

## 2013-09-21 DIAGNOSIS — I69991 Dysphagia following unspecified cerebrovascular disease: Secondary | ICD-10-CM | POA: Diagnosis not present

## 2013-09-22 DIAGNOSIS — I69991 Dysphagia following unspecified cerebrovascular disease: Secondary | ICD-10-CM | POA: Diagnosis not present

## 2013-09-22 DIAGNOSIS — I69998 Other sequelae following unspecified cerebrovascular disease: Secondary | ICD-10-CM | POA: Diagnosis not present

## 2013-09-23 DIAGNOSIS — I69998 Other sequelae following unspecified cerebrovascular disease: Secondary | ICD-10-CM | POA: Diagnosis not present

## 2013-09-23 DIAGNOSIS — I69991 Dysphagia following unspecified cerebrovascular disease: Secondary | ICD-10-CM | POA: Diagnosis not present

## 2013-09-24 DIAGNOSIS — I69998 Other sequelae following unspecified cerebrovascular disease: Secondary | ICD-10-CM | POA: Diagnosis not present

## 2013-09-24 DIAGNOSIS — I69991 Dysphagia following unspecified cerebrovascular disease: Secondary | ICD-10-CM | POA: Diagnosis not present

## 2013-09-25 DIAGNOSIS — I69991 Dysphagia following unspecified cerebrovascular disease: Secondary | ICD-10-CM | POA: Diagnosis not present

## 2013-09-25 DIAGNOSIS — I69998 Other sequelae following unspecified cerebrovascular disease: Secondary | ICD-10-CM | POA: Diagnosis not present

## 2013-09-26 DIAGNOSIS — I69991 Dysphagia following unspecified cerebrovascular disease: Secondary | ICD-10-CM | POA: Diagnosis not present

## 2013-09-26 DIAGNOSIS — I69998 Other sequelae following unspecified cerebrovascular disease: Secondary | ICD-10-CM | POA: Diagnosis not present

## 2013-09-27 DIAGNOSIS — I69991 Dysphagia following unspecified cerebrovascular disease: Secondary | ICD-10-CM | POA: Diagnosis not present

## 2013-09-27 DIAGNOSIS — I69998 Other sequelae following unspecified cerebrovascular disease: Secondary | ICD-10-CM | POA: Diagnosis not present

## 2013-09-28 DIAGNOSIS — I69991 Dysphagia following unspecified cerebrovascular disease: Secondary | ICD-10-CM | POA: Diagnosis not present

## 2013-09-28 DIAGNOSIS — I69998 Other sequelae following unspecified cerebrovascular disease: Secondary | ICD-10-CM | POA: Diagnosis not present

## 2013-09-29 DIAGNOSIS — I69991 Dysphagia following unspecified cerebrovascular disease: Secondary | ICD-10-CM | POA: Diagnosis not present

## 2013-09-29 DIAGNOSIS — I69998 Other sequelae following unspecified cerebrovascular disease: Secondary | ICD-10-CM | POA: Diagnosis not present

## 2013-09-29 DIAGNOSIS — IMO0001 Reserved for inherently not codable concepts without codable children: Secondary | ICD-10-CM | POA: Diagnosis not present

## 2013-09-29 DIAGNOSIS — I679 Cerebrovascular disease, unspecified: Secondary | ICD-10-CM | POA: Diagnosis not present

## 2013-09-29 DIAGNOSIS — I509 Heart failure, unspecified: Secondary | ICD-10-CM | POA: Diagnosis not present

## 2013-09-30 DIAGNOSIS — I69991 Dysphagia following unspecified cerebrovascular disease: Secondary | ICD-10-CM | POA: Diagnosis not present

## 2013-09-30 DIAGNOSIS — I69998 Other sequelae following unspecified cerebrovascular disease: Secondary | ICD-10-CM | POA: Diagnosis not present

## 2013-10-02 DIAGNOSIS — I69998 Other sequelae following unspecified cerebrovascular disease: Secondary | ICD-10-CM | POA: Diagnosis not present

## 2013-10-02 DIAGNOSIS — I69991 Dysphagia following unspecified cerebrovascular disease: Secondary | ICD-10-CM | POA: Diagnosis not present

## 2013-10-03 DIAGNOSIS — I69991 Dysphagia following unspecified cerebrovascular disease: Secondary | ICD-10-CM | POA: Diagnosis not present

## 2013-10-03 DIAGNOSIS — I69998 Other sequelae following unspecified cerebrovascular disease: Secondary | ICD-10-CM | POA: Diagnosis not present

## 2013-10-03 DIAGNOSIS — I10 Essential (primary) hypertension: Secondary | ICD-10-CM | POA: Diagnosis not present

## 2013-10-03 DIAGNOSIS — M6281 Muscle weakness (generalized): Secondary | ICD-10-CM | POA: Diagnosis not present

## 2013-10-03 DIAGNOSIS — M109 Gout, unspecified: Secondary | ICD-10-CM | POA: Diagnosis not present

## 2013-10-03 DIAGNOSIS — G8190 Hemiplegia, unspecified affecting unspecified side: Secondary | ICD-10-CM | POA: Diagnosis not present

## 2013-10-03 DIAGNOSIS — I69920 Aphasia following unspecified cerebrovascular disease: Secondary | ICD-10-CM | POA: Diagnosis not present

## 2013-10-03 DIAGNOSIS — R1310 Dysphagia, unspecified: Secondary | ICD-10-CM | POA: Diagnosis not present

## 2013-10-03 DIAGNOSIS — I619 Nontraumatic intracerebral hemorrhage, unspecified: Secondary | ICD-10-CM | POA: Diagnosis not present

## 2013-10-03 DIAGNOSIS — G4733 Obstructive sleep apnea (adult) (pediatric): Secondary | ICD-10-CM | POA: Diagnosis not present

## 2013-10-03 DIAGNOSIS — I824Z9 Acute embolism and thrombosis of unspecified deep veins of unspecified distal lower extremity: Secondary | ICD-10-CM | POA: Diagnosis not present

## 2013-10-03 DIAGNOSIS — K219 Gastro-esophageal reflux disease without esophagitis: Secondary | ICD-10-CM | POA: Diagnosis not present

## 2013-10-08 DIAGNOSIS — F329 Major depressive disorder, single episode, unspecified: Secondary | ICD-10-CM | POA: Diagnosis not present

## 2013-10-08 DIAGNOSIS — F3289 Other specified depressive episodes: Secondary | ICD-10-CM | POA: Diagnosis not present

## 2013-10-14 DIAGNOSIS — I10 Essential (primary) hypertension: Secondary | ICD-10-CM | POA: Diagnosis not present

## 2013-10-14 DIAGNOSIS — D649 Anemia, unspecified: Secondary | ICD-10-CM | POA: Diagnosis not present

## 2013-10-17 DIAGNOSIS — F3289 Other specified depressive episodes: Secondary | ICD-10-CM | POA: Diagnosis not present

## 2013-10-17 DIAGNOSIS — F329 Major depressive disorder, single episode, unspecified: Secondary | ICD-10-CM | POA: Diagnosis not present

## 2013-10-19 DIAGNOSIS — F3289 Other specified depressive episodes: Secondary | ICD-10-CM | POA: Diagnosis not present

## 2013-10-19 DIAGNOSIS — F329 Major depressive disorder, single episode, unspecified: Secondary | ICD-10-CM | POA: Diagnosis not present

## 2013-10-21 DIAGNOSIS — M109 Gout, unspecified: Secondary | ICD-10-CM | POA: Diagnosis not present

## 2013-10-30 DIAGNOSIS — F329 Major depressive disorder, single episode, unspecified: Secondary | ICD-10-CM | POA: Diagnosis not present

## 2013-10-30 DIAGNOSIS — F3289 Other specified depressive episodes: Secondary | ICD-10-CM | POA: Diagnosis not present

## 2013-11-01 DIAGNOSIS — F3289 Other specified depressive episodes: Secondary | ICD-10-CM | POA: Diagnosis not present

## 2013-11-01 DIAGNOSIS — F329 Major depressive disorder, single episode, unspecified: Secondary | ICD-10-CM | POA: Diagnosis not present

## 2013-11-02 DIAGNOSIS — R4189 Other symptoms and signs involving cognitive functions and awareness: Secondary | ICD-10-CM | POA: Diagnosis not present

## 2013-11-02 DIAGNOSIS — I69991 Dysphagia following unspecified cerebrovascular disease: Secondary | ICD-10-CM | POA: Diagnosis not present

## 2013-11-02 DIAGNOSIS — M6281 Muscle weakness (generalized): Secondary | ICD-10-CM | POA: Diagnosis not present

## 2013-11-02 DIAGNOSIS — R41840 Attention and concentration deficit: Secondary | ICD-10-CM | POA: Diagnosis not present

## 2013-11-02 DIAGNOSIS — R41841 Cognitive communication deficit: Secondary | ICD-10-CM | POA: Diagnosis not present

## 2013-11-02 DIAGNOSIS — I69998 Other sequelae following unspecified cerebrovascular disease: Secondary | ICD-10-CM | POA: Diagnosis not present

## 2013-11-02 DIAGNOSIS — I619 Nontraumatic intracerebral hemorrhage, unspecified: Secondary | ICD-10-CM | POA: Diagnosis not present

## 2013-11-03 DIAGNOSIS — R41840 Attention and concentration deficit: Secondary | ICD-10-CM | POA: Diagnosis not present

## 2013-11-03 DIAGNOSIS — I619 Nontraumatic intracerebral hemorrhage, unspecified: Secondary | ICD-10-CM | POA: Diagnosis not present

## 2013-11-03 DIAGNOSIS — M6281 Muscle weakness (generalized): Secondary | ICD-10-CM | POA: Diagnosis not present

## 2013-11-03 DIAGNOSIS — I69991 Dysphagia following unspecified cerebrovascular disease: Secondary | ICD-10-CM | POA: Diagnosis not present

## 2013-11-03 DIAGNOSIS — I69998 Other sequelae following unspecified cerebrovascular disease: Secondary | ICD-10-CM | POA: Diagnosis not present

## 2013-11-03 DIAGNOSIS — R4189 Other symptoms and signs involving cognitive functions and awareness: Secondary | ICD-10-CM | POA: Diagnosis not present

## 2013-11-04 DIAGNOSIS — I69998 Other sequelae following unspecified cerebrovascular disease: Secondary | ICD-10-CM | POA: Diagnosis not present

## 2013-11-04 DIAGNOSIS — I619 Nontraumatic intracerebral hemorrhage, unspecified: Secondary | ICD-10-CM | POA: Diagnosis not present

## 2013-11-04 DIAGNOSIS — R4189 Other symptoms and signs involving cognitive functions and awareness: Secondary | ICD-10-CM | POA: Diagnosis not present

## 2013-11-04 DIAGNOSIS — I69991 Dysphagia following unspecified cerebrovascular disease: Secondary | ICD-10-CM | POA: Diagnosis not present

## 2013-11-04 DIAGNOSIS — R41840 Attention and concentration deficit: Secondary | ICD-10-CM | POA: Diagnosis not present

## 2013-11-04 DIAGNOSIS — M6281 Muscle weakness (generalized): Secondary | ICD-10-CM | POA: Diagnosis not present

## 2013-11-06 DIAGNOSIS — I69991 Dysphagia following unspecified cerebrovascular disease: Secondary | ICD-10-CM | POA: Diagnosis not present

## 2013-11-06 DIAGNOSIS — I69998 Other sequelae following unspecified cerebrovascular disease: Secondary | ICD-10-CM | POA: Diagnosis not present

## 2013-11-06 DIAGNOSIS — I619 Nontraumatic intracerebral hemorrhage, unspecified: Secondary | ICD-10-CM | POA: Diagnosis not present

## 2013-11-06 DIAGNOSIS — R41840 Attention and concentration deficit: Secondary | ICD-10-CM | POA: Diagnosis not present

## 2013-11-06 DIAGNOSIS — M6281 Muscle weakness (generalized): Secondary | ICD-10-CM | POA: Diagnosis not present

## 2013-11-06 DIAGNOSIS — R4189 Other symptoms and signs involving cognitive functions and awareness: Secondary | ICD-10-CM | POA: Diagnosis not present

## 2013-11-07 DIAGNOSIS — F3289 Other specified depressive episodes: Secondary | ICD-10-CM | POA: Diagnosis not present

## 2013-11-07 DIAGNOSIS — M6281 Muscle weakness (generalized): Secondary | ICD-10-CM | POA: Diagnosis not present

## 2013-11-07 DIAGNOSIS — I69998 Other sequelae following unspecified cerebrovascular disease: Secondary | ICD-10-CM | POA: Diagnosis not present

## 2013-11-07 DIAGNOSIS — R4189 Other symptoms and signs involving cognitive functions and awareness: Secondary | ICD-10-CM | POA: Diagnosis not present

## 2013-11-07 DIAGNOSIS — F329 Major depressive disorder, single episode, unspecified: Secondary | ICD-10-CM | POA: Diagnosis not present

## 2013-11-07 DIAGNOSIS — I619 Nontraumatic intracerebral hemorrhage, unspecified: Secondary | ICD-10-CM | POA: Diagnosis not present

## 2013-11-07 DIAGNOSIS — R41840 Attention and concentration deficit: Secondary | ICD-10-CM | POA: Diagnosis not present

## 2013-11-07 DIAGNOSIS — I69991 Dysphagia following unspecified cerebrovascular disease: Secondary | ICD-10-CM | POA: Diagnosis not present

## 2013-11-08 DIAGNOSIS — I69991 Dysphagia following unspecified cerebrovascular disease: Secondary | ICD-10-CM | POA: Diagnosis not present

## 2013-11-08 DIAGNOSIS — R4189 Other symptoms and signs involving cognitive functions and awareness: Secondary | ICD-10-CM | POA: Diagnosis not present

## 2013-11-08 DIAGNOSIS — R41840 Attention and concentration deficit: Secondary | ICD-10-CM | POA: Diagnosis not present

## 2013-11-08 DIAGNOSIS — I619 Nontraumatic intracerebral hemorrhage, unspecified: Secondary | ICD-10-CM | POA: Diagnosis not present

## 2013-11-08 DIAGNOSIS — I69998 Other sequelae following unspecified cerebrovascular disease: Secondary | ICD-10-CM | POA: Diagnosis not present

## 2013-11-08 DIAGNOSIS — M6281 Muscle weakness (generalized): Secondary | ICD-10-CM | POA: Diagnosis not present

## 2013-11-09 DIAGNOSIS — R41840 Attention and concentration deficit: Secondary | ICD-10-CM | POA: Diagnosis not present

## 2013-11-09 DIAGNOSIS — I69991 Dysphagia following unspecified cerebrovascular disease: Secondary | ICD-10-CM | POA: Diagnosis not present

## 2013-11-09 DIAGNOSIS — R4189 Other symptoms and signs involving cognitive functions and awareness: Secondary | ICD-10-CM | POA: Diagnosis not present

## 2013-11-09 DIAGNOSIS — F329 Major depressive disorder, single episode, unspecified: Secondary | ICD-10-CM | POA: Diagnosis not present

## 2013-11-09 DIAGNOSIS — M6281 Muscle weakness (generalized): Secondary | ICD-10-CM | POA: Diagnosis not present

## 2013-11-09 DIAGNOSIS — I69998 Other sequelae following unspecified cerebrovascular disease: Secondary | ICD-10-CM | POA: Diagnosis not present

## 2013-11-09 DIAGNOSIS — I619 Nontraumatic intracerebral hemorrhage, unspecified: Secondary | ICD-10-CM | POA: Diagnosis not present

## 2013-11-09 DIAGNOSIS — F3289 Other specified depressive episodes: Secondary | ICD-10-CM | POA: Diagnosis not present

## 2013-11-10 DIAGNOSIS — I69991 Dysphagia following unspecified cerebrovascular disease: Secondary | ICD-10-CM | POA: Diagnosis not present

## 2013-11-10 DIAGNOSIS — I619 Nontraumatic intracerebral hemorrhage, unspecified: Secondary | ICD-10-CM | POA: Diagnosis not present

## 2013-11-10 DIAGNOSIS — R41840 Attention and concentration deficit: Secondary | ICD-10-CM | POA: Diagnosis not present

## 2013-11-10 DIAGNOSIS — I69998 Other sequelae following unspecified cerebrovascular disease: Secondary | ICD-10-CM | POA: Diagnosis not present

## 2013-11-10 DIAGNOSIS — R4189 Other symptoms and signs involving cognitive functions and awareness: Secondary | ICD-10-CM | POA: Diagnosis not present

## 2013-11-10 DIAGNOSIS — M6281 Muscle weakness (generalized): Secondary | ICD-10-CM | POA: Diagnosis not present

## 2013-11-11 DIAGNOSIS — R4189 Other symptoms and signs involving cognitive functions and awareness: Secondary | ICD-10-CM | POA: Diagnosis not present

## 2013-11-11 DIAGNOSIS — I69991 Dysphagia following unspecified cerebrovascular disease: Secondary | ICD-10-CM | POA: Diagnosis not present

## 2013-11-11 DIAGNOSIS — M6281 Muscle weakness (generalized): Secondary | ICD-10-CM | POA: Diagnosis not present

## 2013-11-11 DIAGNOSIS — R41840 Attention and concentration deficit: Secondary | ICD-10-CM | POA: Diagnosis not present

## 2013-11-11 DIAGNOSIS — I69998 Other sequelae following unspecified cerebrovascular disease: Secondary | ICD-10-CM | POA: Diagnosis not present

## 2013-11-11 DIAGNOSIS — I619 Nontraumatic intracerebral hemorrhage, unspecified: Secondary | ICD-10-CM | POA: Diagnosis not present

## 2013-11-14 DIAGNOSIS — I69991 Dysphagia following unspecified cerebrovascular disease: Secondary | ICD-10-CM | POA: Diagnosis not present

## 2013-11-14 DIAGNOSIS — I69998 Other sequelae following unspecified cerebrovascular disease: Secondary | ICD-10-CM | POA: Diagnosis not present

## 2013-11-14 DIAGNOSIS — M6281 Muscle weakness (generalized): Secondary | ICD-10-CM | POA: Diagnosis not present

## 2013-11-14 DIAGNOSIS — I619 Nontraumatic intracerebral hemorrhage, unspecified: Secondary | ICD-10-CM | POA: Diagnosis not present

## 2013-11-14 DIAGNOSIS — R4189 Other symptoms and signs involving cognitive functions and awareness: Secondary | ICD-10-CM | POA: Diagnosis not present

## 2013-11-14 DIAGNOSIS — R41840 Attention and concentration deficit: Secondary | ICD-10-CM | POA: Diagnosis not present

## 2013-11-15 DIAGNOSIS — I619 Nontraumatic intracerebral hemorrhage, unspecified: Secondary | ICD-10-CM | POA: Diagnosis not present

## 2013-11-15 DIAGNOSIS — R41840 Attention and concentration deficit: Secondary | ICD-10-CM | POA: Diagnosis not present

## 2013-11-15 DIAGNOSIS — IMO0001 Reserved for inherently not codable concepts without codable children: Secondary | ICD-10-CM | POA: Diagnosis not present

## 2013-11-15 DIAGNOSIS — I679 Cerebrovascular disease, unspecified: Secondary | ICD-10-CM | POA: Diagnosis not present

## 2013-11-15 DIAGNOSIS — I69991 Dysphagia following unspecified cerebrovascular disease: Secondary | ICD-10-CM | POA: Diagnosis not present

## 2013-11-15 DIAGNOSIS — I69998 Other sequelae following unspecified cerebrovascular disease: Secondary | ICD-10-CM | POA: Diagnosis not present

## 2013-11-15 DIAGNOSIS — M6281 Muscle weakness (generalized): Secondary | ICD-10-CM | POA: Diagnosis not present

## 2013-11-15 DIAGNOSIS — R4189 Other symptoms and signs involving cognitive functions and awareness: Secondary | ICD-10-CM | POA: Diagnosis not present

## 2013-11-15 DIAGNOSIS — I509 Heart failure, unspecified: Secondary | ICD-10-CM | POA: Diagnosis not present

## 2013-11-16 DIAGNOSIS — F329 Major depressive disorder, single episode, unspecified: Secondary | ICD-10-CM | POA: Diagnosis not present

## 2013-11-16 DIAGNOSIS — I69998 Other sequelae following unspecified cerebrovascular disease: Secondary | ICD-10-CM | POA: Diagnosis not present

## 2013-11-16 DIAGNOSIS — I619 Nontraumatic intracerebral hemorrhage, unspecified: Secondary | ICD-10-CM | POA: Diagnosis not present

## 2013-11-16 DIAGNOSIS — M6281 Muscle weakness (generalized): Secondary | ICD-10-CM | POA: Diagnosis not present

## 2013-11-16 DIAGNOSIS — R41840 Attention and concentration deficit: Secondary | ICD-10-CM | POA: Diagnosis not present

## 2013-11-16 DIAGNOSIS — F3289 Other specified depressive episodes: Secondary | ICD-10-CM | POA: Diagnosis not present

## 2013-11-16 DIAGNOSIS — I69991 Dysphagia following unspecified cerebrovascular disease: Secondary | ICD-10-CM | POA: Diagnosis not present

## 2013-11-16 DIAGNOSIS — R4189 Other symptoms and signs involving cognitive functions and awareness: Secondary | ICD-10-CM | POA: Diagnosis not present

## 2013-11-17 DIAGNOSIS — I619 Nontraumatic intracerebral hemorrhage, unspecified: Secondary | ICD-10-CM | POA: Diagnosis not present

## 2013-11-17 DIAGNOSIS — I69991 Dysphagia following unspecified cerebrovascular disease: Secondary | ICD-10-CM | POA: Diagnosis not present

## 2013-11-17 DIAGNOSIS — R41840 Attention and concentration deficit: Secondary | ICD-10-CM | POA: Diagnosis not present

## 2013-11-17 DIAGNOSIS — I69998 Other sequelae following unspecified cerebrovascular disease: Secondary | ICD-10-CM | POA: Diagnosis not present

## 2013-11-17 DIAGNOSIS — M6281 Muscle weakness (generalized): Secondary | ICD-10-CM | POA: Diagnosis not present

## 2013-11-17 DIAGNOSIS — R4189 Other symptoms and signs involving cognitive functions and awareness: Secondary | ICD-10-CM | POA: Diagnosis not present

## 2013-11-18 DIAGNOSIS — F3289 Other specified depressive episodes: Secondary | ICD-10-CM | POA: Diagnosis not present

## 2013-11-18 DIAGNOSIS — I619 Nontraumatic intracerebral hemorrhage, unspecified: Secondary | ICD-10-CM | POA: Diagnosis not present

## 2013-11-18 DIAGNOSIS — I69998 Other sequelae following unspecified cerebrovascular disease: Secondary | ICD-10-CM | POA: Diagnosis not present

## 2013-11-18 DIAGNOSIS — F329 Major depressive disorder, single episode, unspecified: Secondary | ICD-10-CM | POA: Diagnosis not present

## 2013-11-18 DIAGNOSIS — I69991 Dysphagia following unspecified cerebrovascular disease: Secondary | ICD-10-CM | POA: Diagnosis not present

## 2013-11-18 DIAGNOSIS — R4189 Other symptoms and signs involving cognitive functions and awareness: Secondary | ICD-10-CM | POA: Diagnosis not present

## 2013-11-18 DIAGNOSIS — M6281 Muscle weakness (generalized): Secondary | ICD-10-CM | POA: Diagnosis not present

## 2013-11-18 DIAGNOSIS — I824Z9 Acute embolism and thrombosis of unspecified deep veins of unspecified distal lower extremity: Secondary | ICD-10-CM | POA: Diagnosis not present

## 2013-11-18 DIAGNOSIS — R41840 Attention and concentration deficit: Secondary | ICD-10-CM | POA: Diagnosis not present

## 2013-11-20 DIAGNOSIS — I69991 Dysphagia following unspecified cerebrovascular disease: Secondary | ICD-10-CM | POA: Diagnosis not present

## 2013-11-20 DIAGNOSIS — R4189 Other symptoms and signs involving cognitive functions and awareness: Secondary | ICD-10-CM | POA: Diagnosis not present

## 2013-11-20 DIAGNOSIS — R41840 Attention and concentration deficit: Secondary | ICD-10-CM | POA: Diagnosis not present

## 2013-11-20 DIAGNOSIS — M6281 Muscle weakness (generalized): Secondary | ICD-10-CM | POA: Diagnosis not present

## 2013-11-20 DIAGNOSIS — I619 Nontraumatic intracerebral hemorrhage, unspecified: Secondary | ICD-10-CM | POA: Diagnosis not present

## 2013-11-20 DIAGNOSIS — I69998 Other sequelae following unspecified cerebrovascular disease: Secondary | ICD-10-CM | POA: Diagnosis not present

## 2013-11-21 DIAGNOSIS — M6281 Muscle weakness (generalized): Secondary | ICD-10-CM | POA: Diagnosis not present

## 2013-11-21 DIAGNOSIS — R41840 Attention and concentration deficit: Secondary | ICD-10-CM | POA: Diagnosis not present

## 2013-11-21 DIAGNOSIS — R4189 Other symptoms and signs involving cognitive functions and awareness: Secondary | ICD-10-CM | POA: Diagnosis not present

## 2013-11-21 DIAGNOSIS — I69998 Other sequelae following unspecified cerebrovascular disease: Secondary | ICD-10-CM | POA: Diagnosis not present

## 2013-11-21 DIAGNOSIS — I619 Nontraumatic intracerebral hemorrhage, unspecified: Secondary | ICD-10-CM | POA: Diagnosis not present

## 2013-11-21 DIAGNOSIS — I69991 Dysphagia following unspecified cerebrovascular disease: Secondary | ICD-10-CM | POA: Diagnosis not present

## 2013-11-21 DIAGNOSIS — F329 Major depressive disorder, single episode, unspecified: Secondary | ICD-10-CM | POA: Diagnosis not present

## 2013-11-21 DIAGNOSIS — F3289 Other specified depressive episodes: Secondary | ICD-10-CM | POA: Diagnosis not present

## 2013-11-22 DIAGNOSIS — R41840 Attention and concentration deficit: Secondary | ICD-10-CM | POA: Diagnosis not present

## 2013-11-22 DIAGNOSIS — I69998 Other sequelae following unspecified cerebrovascular disease: Secondary | ICD-10-CM | POA: Diagnosis not present

## 2013-11-22 DIAGNOSIS — I69991 Dysphagia following unspecified cerebrovascular disease: Secondary | ICD-10-CM | POA: Diagnosis not present

## 2013-11-22 DIAGNOSIS — M6281 Muscle weakness (generalized): Secondary | ICD-10-CM | POA: Diagnosis not present

## 2013-11-22 DIAGNOSIS — I619 Nontraumatic intracerebral hemorrhage, unspecified: Secondary | ICD-10-CM | POA: Diagnosis not present

## 2013-11-22 DIAGNOSIS — R4189 Other symptoms and signs involving cognitive functions and awareness: Secondary | ICD-10-CM | POA: Diagnosis not present

## 2013-11-23 DIAGNOSIS — F3289 Other specified depressive episodes: Secondary | ICD-10-CM | POA: Diagnosis not present

## 2013-11-23 DIAGNOSIS — I619 Nontraumatic intracerebral hemorrhage, unspecified: Secondary | ICD-10-CM | POA: Diagnosis not present

## 2013-11-23 DIAGNOSIS — R41840 Attention and concentration deficit: Secondary | ICD-10-CM | POA: Diagnosis not present

## 2013-11-23 DIAGNOSIS — I69998 Other sequelae following unspecified cerebrovascular disease: Secondary | ICD-10-CM | POA: Diagnosis not present

## 2013-11-23 DIAGNOSIS — M6281 Muscle weakness (generalized): Secondary | ICD-10-CM | POA: Diagnosis not present

## 2013-11-23 DIAGNOSIS — R4189 Other symptoms and signs involving cognitive functions and awareness: Secondary | ICD-10-CM | POA: Diagnosis not present

## 2013-11-23 DIAGNOSIS — I69991 Dysphagia following unspecified cerebrovascular disease: Secondary | ICD-10-CM | POA: Diagnosis not present

## 2013-11-23 DIAGNOSIS — F329 Major depressive disorder, single episode, unspecified: Secondary | ICD-10-CM | POA: Diagnosis not present

## 2013-11-24 DIAGNOSIS — M6281 Muscle weakness (generalized): Secondary | ICD-10-CM | POA: Diagnosis not present

## 2013-11-24 DIAGNOSIS — I69998 Other sequelae following unspecified cerebrovascular disease: Secondary | ICD-10-CM | POA: Diagnosis not present

## 2013-11-24 DIAGNOSIS — R4189 Other symptoms and signs involving cognitive functions and awareness: Secondary | ICD-10-CM | POA: Diagnosis not present

## 2013-11-24 DIAGNOSIS — I619 Nontraumatic intracerebral hemorrhage, unspecified: Secondary | ICD-10-CM | POA: Diagnosis not present

## 2013-11-24 DIAGNOSIS — I69991 Dysphagia following unspecified cerebrovascular disease: Secondary | ICD-10-CM | POA: Diagnosis not present

## 2013-11-24 DIAGNOSIS — R41840 Attention and concentration deficit: Secondary | ICD-10-CM | POA: Diagnosis not present

## 2013-11-25 DIAGNOSIS — M6281 Muscle weakness (generalized): Secondary | ICD-10-CM | POA: Diagnosis not present

## 2013-11-25 DIAGNOSIS — I619 Nontraumatic intracerebral hemorrhage, unspecified: Secondary | ICD-10-CM | POA: Diagnosis not present

## 2013-11-25 DIAGNOSIS — R41840 Attention and concentration deficit: Secondary | ICD-10-CM | POA: Diagnosis not present

## 2013-11-25 DIAGNOSIS — I69998 Other sequelae following unspecified cerebrovascular disease: Secondary | ICD-10-CM | POA: Diagnosis not present

## 2013-11-25 DIAGNOSIS — I69991 Dysphagia following unspecified cerebrovascular disease: Secondary | ICD-10-CM | POA: Diagnosis not present

## 2013-11-25 DIAGNOSIS — R4189 Other symptoms and signs involving cognitive functions and awareness: Secondary | ICD-10-CM | POA: Diagnosis not present

## 2013-11-28 DIAGNOSIS — R41840 Attention and concentration deficit: Secondary | ICD-10-CM | POA: Diagnosis not present

## 2013-11-28 DIAGNOSIS — I69998 Other sequelae following unspecified cerebrovascular disease: Secondary | ICD-10-CM | POA: Diagnosis not present

## 2013-11-28 DIAGNOSIS — I69991 Dysphagia following unspecified cerebrovascular disease: Secondary | ICD-10-CM | POA: Diagnosis not present

## 2013-11-28 DIAGNOSIS — F3289 Other specified depressive episodes: Secondary | ICD-10-CM | POA: Diagnosis not present

## 2013-11-28 DIAGNOSIS — F329 Major depressive disorder, single episode, unspecified: Secondary | ICD-10-CM | POA: Diagnosis not present

## 2013-11-28 DIAGNOSIS — R4189 Other symptoms and signs involving cognitive functions and awareness: Secondary | ICD-10-CM | POA: Diagnosis not present

## 2013-11-28 DIAGNOSIS — I619 Nontraumatic intracerebral hemorrhage, unspecified: Secondary | ICD-10-CM | POA: Diagnosis not present

## 2013-11-28 DIAGNOSIS — M6281 Muscle weakness (generalized): Secondary | ICD-10-CM | POA: Diagnosis not present

## 2013-11-29 DIAGNOSIS — M79609 Pain in unspecified limb: Secondary | ICD-10-CM | POA: Diagnosis not present

## 2013-11-29 DIAGNOSIS — M6281 Muscle weakness (generalized): Secondary | ICD-10-CM | POA: Diagnosis not present

## 2013-11-29 DIAGNOSIS — I69991 Dysphagia following unspecified cerebrovascular disease: Secondary | ICD-10-CM | POA: Diagnosis not present

## 2013-11-29 DIAGNOSIS — I619 Nontraumatic intracerebral hemorrhage, unspecified: Secondary | ICD-10-CM | POA: Diagnosis not present

## 2013-11-29 DIAGNOSIS — R4189 Other symptoms and signs involving cognitive functions and awareness: Secondary | ICD-10-CM | POA: Diagnosis not present

## 2013-11-29 DIAGNOSIS — B351 Tinea unguium: Secondary | ICD-10-CM | POA: Diagnosis not present

## 2013-11-29 DIAGNOSIS — I69998 Other sequelae following unspecified cerebrovascular disease: Secondary | ICD-10-CM | POA: Diagnosis not present

## 2013-11-29 DIAGNOSIS — R41840 Attention and concentration deficit: Secondary | ICD-10-CM | POA: Diagnosis not present

## 2013-11-30 DIAGNOSIS — I69998 Other sequelae following unspecified cerebrovascular disease: Secondary | ICD-10-CM | POA: Diagnosis not present

## 2013-11-30 DIAGNOSIS — M6281 Muscle weakness (generalized): Secondary | ICD-10-CM | POA: Diagnosis not present

## 2013-11-30 DIAGNOSIS — R41840 Attention and concentration deficit: Secondary | ICD-10-CM | POA: Diagnosis not present

## 2013-11-30 DIAGNOSIS — F329 Major depressive disorder, single episode, unspecified: Secondary | ICD-10-CM | POA: Diagnosis not present

## 2013-11-30 DIAGNOSIS — I619 Nontraumatic intracerebral hemorrhage, unspecified: Secondary | ICD-10-CM | POA: Diagnosis not present

## 2013-11-30 DIAGNOSIS — I69991 Dysphagia following unspecified cerebrovascular disease: Secondary | ICD-10-CM | POA: Diagnosis not present

## 2013-11-30 DIAGNOSIS — F3289 Other specified depressive episodes: Secondary | ICD-10-CM | POA: Diagnosis not present

## 2013-11-30 DIAGNOSIS — R4189 Other symptoms and signs involving cognitive functions and awareness: Secondary | ICD-10-CM | POA: Diagnosis not present

## 2013-12-01 DIAGNOSIS — M6281 Muscle weakness (generalized): Secondary | ICD-10-CM | POA: Diagnosis not present

## 2013-12-01 DIAGNOSIS — R41840 Attention and concentration deficit: Secondary | ICD-10-CM | POA: Diagnosis not present

## 2013-12-01 DIAGNOSIS — I619 Nontraumatic intracerebral hemorrhage, unspecified: Secondary | ICD-10-CM | POA: Diagnosis not present

## 2013-12-01 DIAGNOSIS — I69998 Other sequelae following unspecified cerebrovascular disease: Secondary | ICD-10-CM | POA: Diagnosis not present

## 2013-12-01 DIAGNOSIS — R4189 Other symptoms and signs involving cognitive functions and awareness: Secondary | ICD-10-CM | POA: Diagnosis not present

## 2013-12-01 DIAGNOSIS — I69991 Dysphagia following unspecified cerebrovascular disease: Secondary | ICD-10-CM | POA: Diagnosis not present

## 2013-12-02 DIAGNOSIS — R4189 Other symptoms and signs involving cognitive functions and awareness: Secondary | ICD-10-CM | POA: Diagnosis not present

## 2013-12-02 DIAGNOSIS — I619 Nontraumatic intracerebral hemorrhage, unspecified: Secondary | ICD-10-CM | POA: Diagnosis not present

## 2013-12-02 DIAGNOSIS — I69991 Dysphagia following unspecified cerebrovascular disease: Secondary | ICD-10-CM | POA: Diagnosis not present

## 2013-12-02 DIAGNOSIS — R41840 Attention and concentration deficit: Secondary | ICD-10-CM | POA: Diagnosis not present

## 2013-12-02 DIAGNOSIS — M6281 Muscle weakness (generalized): Secondary | ICD-10-CM | POA: Diagnosis not present

## 2013-12-02 DIAGNOSIS — I69998 Other sequelae following unspecified cerebrovascular disease: Secondary | ICD-10-CM | POA: Diagnosis not present

## 2013-12-06 DIAGNOSIS — I619 Nontraumatic intracerebral hemorrhage, unspecified: Secondary | ICD-10-CM | POA: Diagnosis not present

## 2013-12-06 DIAGNOSIS — I824Z9 Acute embolism and thrombosis of unspecified deep veins of unspecified distal lower extremity: Secondary | ICD-10-CM | POA: Diagnosis not present

## 2013-12-06 DIAGNOSIS — I10 Essential (primary) hypertension: Secondary | ICD-10-CM | POA: Diagnosis not present

## 2013-12-07 DIAGNOSIS — I619 Nontraumatic intracerebral hemorrhage, unspecified: Secondary | ICD-10-CM | POA: Diagnosis not present

## 2013-12-07 DIAGNOSIS — I824Z9 Acute embolism and thrombosis of unspecified deep veins of unspecified distal lower extremity: Secondary | ICD-10-CM | POA: Diagnosis not present

## 2013-12-07 DIAGNOSIS — I10 Essential (primary) hypertension: Secondary | ICD-10-CM | POA: Diagnosis not present

## 2013-12-08 DIAGNOSIS — I619 Nontraumatic intracerebral hemorrhage, unspecified: Secondary | ICD-10-CM | POA: Diagnosis not present

## 2013-12-08 DIAGNOSIS — I824Z9 Acute embolism and thrombosis of unspecified deep veins of unspecified distal lower extremity: Secondary | ICD-10-CM | POA: Diagnosis not present

## 2013-12-08 DIAGNOSIS — I10 Essential (primary) hypertension: Secondary | ICD-10-CM | POA: Diagnosis not present

## 2013-12-09 DIAGNOSIS — I824Z9 Acute embolism and thrombosis of unspecified deep veins of unspecified distal lower extremity: Secondary | ICD-10-CM | POA: Diagnosis not present

## 2013-12-09 DIAGNOSIS — I619 Nontraumatic intracerebral hemorrhage, unspecified: Secondary | ICD-10-CM | POA: Diagnosis not present

## 2013-12-09 DIAGNOSIS — I10 Essential (primary) hypertension: Secondary | ICD-10-CM | POA: Diagnosis not present

## 2013-12-12 DIAGNOSIS — I10 Essential (primary) hypertension: Secondary | ICD-10-CM | POA: Diagnosis not present

## 2013-12-12 DIAGNOSIS — I619 Nontraumatic intracerebral hemorrhage, unspecified: Secondary | ICD-10-CM | POA: Diagnosis not present

## 2013-12-12 DIAGNOSIS — I824Z9 Acute embolism and thrombosis of unspecified deep veins of unspecified distal lower extremity: Secondary | ICD-10-CM | POA: Diagnosis not present

## 2013-12-13 DIAGNOSIS — I10 Essential (primary) hypertension: Secondary | ICD-10-CM | POA: Diagnosis not present

## 2013-12-13 DIAGNOSIS — I619 Nontraumatic intracerebral hemorrhage, unspecified: Secondary | ICD-10-CM | POA: Diagnosis not present

## 2013-12-13 DIAGNOSIS — I824Z9 Acute embolism and thrombosis of unspecified deep veins of unspecified distal lower extremity: Secondary | ICD-10-CM | POA: Diagnosis not present

## 2013-12-14 DIAGNOSIS — I824Z9 Acute embolism and thrombosis of unspecified deep veins of unspecified distal lower extremity: Secondary | ICD-10-CM | POA: Diagnosis not present

## 2013-12-14 DIAGNOSIS — I619 Nontraumatic intracerebral hemorrhage, unspecified: Secondary | ICD-10-CM | POA: Diagnosis not present

## 2013-12-14 DIAGNOSIS — I10 Essential (primary) hypertension: Secondary | ICD-10-CM | POA: Diagnosis not present

## 2013-12-15 DIAGNOSIS — M549 Dorsalgia, unspecified: Secondary | ICD-10-CM | POA: Diagnosis not present

## 2013-12-15 DIAGNOSIS — I6350 Cerebral infarction due to unspecified occlusion or stenosis of unspecified cerebral artery: Secondary | ICD-10-CM | POA: Diagnosis not present

## 2013-12-15 DIAGNOSIS — F339 Major depressive disorder, recurrent, unspecified: Secondary | ICD-10-CM | POA: Diagnosis not present

## 2013-12-15 DIAGNOSIS — I619 Nontraumatic intracerebral hemorrhage, unspecified: Secondary | ICD-10-CM | POA: Diagnosis not present

## 2013-12-15 DIAGNOSIS — I824Z9 Acute embolism and thrombosis of unspecified deep veins of unspecified distal lower extremity: Secondary | ICD-10-CM | POA: Diagnosis not present

## 2013-12-15 DIAGNOSIS — I10 Essential (primary) hypertension: Secondary | ICD-10-CM | POA: Diagnosis not present

## 2013-12-15 DIAGNOSIS — M109 Gout, unspecified: Secondary | ICD-10-CM | POA: Diagnosis not present

## 2013-12-16 DIAGNOSIS — I824Z9 Acute embolism and thrombosis of unspecified deep veins of unspecified distal lower extremity: Secondary | ICD-10-CM | POA: Diagnosis not present

## 2013-12-16 DIAGNOSIS — I10 Essential (primary) hypertension: Secondary | ICD-10-CM | POA: Diagnosis not present

## 2013-12-16 DIAGNOSIS — I619 Nontraumatic intracerebral hemorrhage, unspecified: Secondary | ICD-10-CM | POA: Diagnosis not present

## 2013-12-19 DIAGNOSIS — I10 Essential (primary) hypertension: Secondary | ICD-10-CM | POA: Diagnosis not present

## 2013-12-19 DIAGNOSIS — I619 Nontraumatic intracerebral hemorrhage, unspecified: Secondary | ICD-10-CM | POA: Diagnosis not present

## 2013-12-19 DIAGNOSIS — I824Z9 Acute embolism and thrombosis of unspecified deep veins of unspecified distal lower extremity: Secondary | ICD-10-CM | POA: Diagnosis not present

## 2013-12-20 DIAGNOSIS — I824Z9 Acute embolism and thrombosis of unspecified deep veins of unspecified distal lower extremity: Secondary | ICD-10-CM | POA: Diagnosis not present

## 2013-12-20 DIAGNOSIS — I619 Nontraumatic intracerebral hemorrhage, unspecified: Secondary | ICD-10-CM | POA: Diagnosis not present

## 2013-12-20 DIAGNOSIS — I10 Essential (primary) hypertension: Secondary | ICD-10-CM | POA: Diagnosis not present

## 2013-12-21 DIAGNOSIS — I10 Essential (primary) hypertension: Secondary | ICD-10-CM | POA: Diagnosis not present

## 2013-12-21 DIAGNOSIS — I824Z9 Acute embolism and thrombosis of unspecified deep veins of unspecified distal lower extremity: Secondary | ICD-10-CM | POA: Diagnosis not present

## 2013-12-21 DIAGNOSIS — I619 Nontraumatic intracerebral hemorrhage, unspecified: Secondary | ICD-10-CM | POA: Diagnosis not present

## 2013-12-23 DIAGNOSIS — I10 Essential (primary) hypertension: Secondary | ICD-10-CM | POA: Diagnosis not present

## 2013-12-23 DIAGNOSIS — H35039 Hypertensive retinopathy, unspecified eye: Secondary | ICD-10-CM | POA: Diagnosis not present

## 2013-12-23 DIAGNOSIS — I824Z9 Acute embolism and thrombosis of unspecified deep veins of unspecified distal lower extremity: Secondary | ICD-10-CM | POA: Diagnosis not present

## 2013-12-23 DIAGNOSIS — I619 Nontraumatic intracerebral hemorrhage, unspecified: Secondary | ICD-10-CM | POA: Diagnosis not present

## 2013-12-26 DIAGNOSIS — I10 Essential (primary) hypertension: Secondary | ICD-10-CM | POA: Diagnosis not present

## 2013-12-26 DIAGNOSIS — I619 Nontraumatic intracerebral hemorrhage, unspecified: Secondary | ICD-10-CM | POA: Diagnosis not present

## 2013-12-26 DIAGNOSIS — I824Z9 Acute embolism and thrombosis of unspecified deep veins of unspecified distal lower extremity: Secondary | ICD-10-CM | POA: Diagnosis not present

## 2013-12-27 DIAGNOSIS — I824Z9 Acute embolism and thrombosis of unspecified deep veins of unspecified distal lower extremity: Secondary | ICD-10-CM | POA: Diagnosis not present

## 2013-12-27 DIAGNOSIS — I619 Nontraumatic intracerebral hemorrhage, unspecified: Secondary | ICD-10-CM | POA: Diagnosis not present

## 2013-12-27 DIAGNOSIS — I10 Essential (primary) hypertension: Secondary | ICD-10-CM | POA: Diagnosis not present

## 2013-12-28 DIAGNOSIS — I619 Nontraumatic intracerebral hemorrhage, unspecified: Secondary | ICD-10-CM | POA: Diagnosis not present

## 2013-12-28 DIAGNOSIS — I10 Essential (primary) hypertension: Secondary | ICD-10-CM | POA: Diagnosis not present

## 2013-12-28 DIAGNOSIS — I824Z9 Acute embolism and thrombosis of unspecified deep veins of unspecified distal lower extremity: Secondary | ICD-10-CM | POA: Diagnosis not present

## 2013-12-29 DIAGNOSIS — I824Z9 Acute embolism and thrombosis of unspecified deep veins of unspecified distal lower extremity: Secondary | ICD-10-CM | POA: Diagnosis not present

## 2013-12-29 DIAGNOSIS — I619 Nontraumatic intracerebral hemorrhage, unspecified: Secondary | ICD-10-CM | POA: Diagnosis not present

## 2013-12-29 DIAGNOSIS — I10 Essential (primary) hypertension: Secondary | ICD-10-CM | POA: Diagnosis not present

## 2013-12-30 DIAGNOSIS — I619 Nontraumatic intracerebral hemorrhage, unspecified: Secondary | ICD-10-CM | POA: Diagnosis not present

## 2013-12-30 DIAGNOSIS — I10 Essential (primary) hypertension: Secondary | ICD-10-CM | POA: Diagnosis not present

## 2013-12-30 DIAGNOSIS — I824Z9 Acute embolism and thrombosis of unspecified deep veins of unspecified distal lower extremity: Secondary | ICD-10-CM | POA: Diagnosis not present

## 2014-01-02 DIAGNOSIS — I619 Nontraumatic intracerebral hemorrhage, unspecified: Secondary | ICD-10-CM | POA: Diagnosis not present

## 2014-01-02 DIAGNOSIS — I10 Essential (primary) hypertension: Secondary | ICD-10-CM | POA: Diagnosis not present

## 2014-01-02 DIAGNOSIS — I824Z9 Acute embolism and thrombosis of unspecified deep veins of unspecified distal lower extremity: Secondary | ICD-10-CM | POA: Diagnosis not present

## 2014-01-03 DIAGNOSIS — I824Z9 Acute embolism and thrombosis of unspecified deep veins of unspecified distal lower extremity: Secondary | ICD-10-CM | POA: Diagnosis not present

## 2014-01-03 DIAGNOSIS — I619 Nontraumatic intracerebral hemorrhage, unspecified: Secondary | ICD-10-CM | POA: Diagnosis not present

## 2014-01-03 DIAGNOSIS — I10 Essential (primary) hypertension: Secondary | ICD-10-CM | POA: Diagnosis not present

## 2014-01-04 DIAGNOSIS — I824Z9 Acute embolism and thrombosis of unspecified deep veins of unspecified distal lower extremity: Secondary | ICD-10-CM | POA: Diagnosis not present

## 2014-01-04 DIAGNOSIS — I619 Nontraumatic intracerebral hemorrhage, unspecified: Secondary | ICD-10-CM | POA: Diagnosis not present

## 2014-01-04 DIAGNOSIS — I10 Essential (primary) hypertension: Secondary | ICD-10-CM | POA: Diagnosis not present

## 2014-01-05 DIAGNOSIS — I824Z9 Acute embolism and thrombosis of unspecified deep veins of unspecified distal lower extremity: Secondary | ICD-10-CM | POA: Diagnosis not present

## 2014-01-05 DIAGNOSIS — I10 Essential (primary) hypertension: Secondary | ICD-10-CM | POA: Diagnosis not present

## 2014-01-05 DIAGNOSIS — I619 Nontraumatic intracerebral hemorrhage, unspecified: Secondary | ICD-10-CM | POA: Diagnosis not present

## 2014-01-06 DIAGNOSIS — I619 Nontraumatic intracerebral hemorrhage, unspecified: Secondary | ICD-10-CM | POA: Diagnosis not present

## 2014-01-06 DIAGNOSIS — I10 Essential (primary) hypertension: Secondary | ICD-10-CM | POA: Diagnosis not present

## 2014-01-06 DIAGNOSIS — I824Z9 Acute embolism and thrombosis of unspecified deep veins of unspecified distal lower extremity: Secondary | ICD-10-CM | POA: Diagnosis not present

## 2014-01-10 DIAGNOSIS — I824Z9 Acute embolism and thrombosis of unspecified deep veins of unspecified distal lower extremity: Secondary | ICD-10-CM | POA: Diagnosis not present

## 2014-01-10 DIAGNOSIS — I10 Essential (primary) hypertension: Secondary | ICD-10-CM | POA: Diagnosis not present

## 2014-01-10 DIAGNOSIS — I619 Nontraumatic intracerebral hemorrhage, unspecified: Secondary | ICD-10-CM | POA: Diagnosis not present

## 2014-01-11 DIAGNOSIS — I619 Nontraumatic intracerebral hemorrhage, unspecified: Secondary | ICD-10-CM | POA: Diagnosis not present

## 2014-01-11 DIAGNOSIS — I10 Essential (primary) hypertension: Secondary | ICD-10-CM | POA: Diagnosis not present

## 2014-01-11 DIAGNOSIS — I824Z9 Acute embolism and thrombosis of unspecified deep veins of unspecified distal lower extremity: Secondary | ICD-10-CM | POA: Diagnosis not present

## 2014-01-12 DIAGNOSIS — I824Z9 Acute embolism and thrombosis of unspecified deep veins of unspecified distal lower extremity: Secondary | ICD-10-CM | POA: Diagnosis not present

## 2014-01-12 DIAGNOSIS — I10 Essential (primary) hypertension: Secondary | ICD-10-CM | POA: Diagnosis not present

## 2014-01-12 DIAGNOSIS — I619 Nontraumatic intracerebral hemorrhage, unspecified: Secondary | ICD-10-CM | POA: Diagnosis not present

## 2014-01-13 DIAGNOSIS — I10 Essential (primary) hypertension: Secondary | ICD-10-CM | POA: Diagnosis not present

## 2014-01-13 DIAGNOSIS — I619 Nontraumatic intracerebral hemorrhage, unspecified: Secondary | ICD-10-CM | POA: Diagnosis not present

## 2014-01-13 DIAGNOSIS — I824Z9 Acute embolism and thrombosis of unspecified deep veins of unspecified distal lower extremity: Secondary | ICD-10-CM | POA: Diagnosis not present

## 2014-01-16 DIAGNOSIS — I10 Essential (primary) hypertension: Secondary | ICD-10-CM | POA: Diagnosis not present

## 2014-01-16 DIAGNOSIS — I824Z9 Acute embolism and thrombosis of unspecified deep veins of unspecified distal lower extremity: Secondary | ICD-10-CM | POA: Diagnosis not present

## 2014-01-16 DIAGNOSIS — I619 Nontraumatic intracerebral hemorrhage, unspecified: Secondary | ICD-10-CM | POA: Diagnosis not present

## 2014-01-17 DIAGNOSIS — I619 Nontraumatic intracerebral hemorrhage, unspecified: Secondary | ICD-10-CM | POA: Diagnosis not present

## 2014-01-17 DIAGNOSIS — I10 Essential (primary) hypertension: Secondary | ICD-10-CM | POA: Diagnosis not present

## 2014-01-17 DIAGNOSIS — I824Z9 Acute embolism and thrombosis of unspecified deep veins of unspecified distal lower extremity: Secondary | ICD-10-CM | POA: Diagnosis not present

## 2014-01-18 DIAGNOSIS — I824Z9 Acute embolism and thrombosis of unspecified deep veins of unspecified distal lower extremity: Secondary | ICD-10-CM | POA: Diagnosis not present

## 2014-01-18 DIAGNOSIS — I619 Nontraumatic intracerebral hemorrhage, unspecified: Secondary | ICD-10-CM | POA: Diagnosis not present

## 2014-01-18 DIAGNOSIS — I10 Essential (primary) hypertension: Secondary | ICD-10-CM | POA: Diagnosis not present

## 2014-01-20 DIAGNOSIS — I619 Nontraumatic intracerebral hemorrhage, unspecified: Secondary | ICD-10-CM | POA: Diagnosis not present

## 2014-01-20 DIAGNOSIS — I824Z9 Acute embolism and thrombosis of unspecified deep veins of unspecified distal lower extremity: Secondary | ICD-10-CM | POA: Diagnosis not present

## 2014-01-20 DIAGNOSIS — I10 Essential (primary) hypertension: Secondary | ICD-10-CM | POA: Diagnosis not present

## 2014-01-23 DIAGNOSIS — I824Z9 Acute embolism and thrombosis of unspecified deep veins of unspecified distal lower extremity: Secondary | ICD-10-CM | POA: Diagnosis not present

## 2014-01-23 DIAGNOSIS — I619 Nontraumatic intracerebral hemorrhage, unspecified: Secondary | ICD-10-CM | POA: Diagnosis not present

## 2014-01-23 DIAGNOSIS — I10 Essential (primary) hypertension: Secondary | ICD-10-CM | POA: Diagnosis not present

## 2014-01-24 DIAGNOSIS — I824Z9 Acute embolism and thrombosis of unspecified deep veins of unspecified distal lower extremity: Secondary | ICD-10-CM | POA: Diagnosis not present

## 2014-01-24 DIAGNOSIS — I10 Essential (primary) hypertension: Secondary | ICD-10-CM | POA: Diagnosis not present

## 2014-01-24 DIAGNOSIS — I619 Nontraumatic intracerebral hemorrhage, unspecified: Secondary | ICD-10-CM | POA: Diagnosis not present

## 2014-01-25 DIAGNOSIS — I824Z9 Acute embolism and thrombosis of unspecified deep veins of unspecified distal lower extremity: Secondary | ICD-10-CM | POA: Diagnosis not present

## 2014-01-25 DIAGNOSIS — I10 Essential (primary) hypertension: Secondary | ICD-10-CM | POA: Diagnosis not present

## 2014-01-25 DIAGNOSIS — I619 Nontraumatic intracerebral hemorrhage, unspecified: Secondary | ICD-10-CM | POA: Diagnosis not present

## 2014-01-27 DIAGNOSIS — I619 Nontraumatic intracerebral hemorrhage, unspecified: Secondary | ICD-10-CM | POA: Diagnosis not present

## 2014-01-27 DIAGNOSIS — I10 Essential (primary) hypertension: Secondary | ICD-10-CM | POA: Diagnosis not present

## 2014-01-27 DIAGNOSIS — I824Z9 Acute embolism and thrombosis of unspecified deep veins of unspecified distal lower extremity: Secondary | ICD-10-CM | POA: Diagnosis not present

## 2014-01-30 DIAGNOSIS — I619 Nontraumatic intracerebral hemorrhage, unspecified: Secondary | ICD-10-CM | POA: Diagnosis not present

## 2014-01-30 DIAGNOSIS — I824Z9 Acute embolism and thrombosis of unspecified deep veins of unspecified distal lower extremity: Secondary | ICD-10-CM | POA: Diagnosis not present

## 2014-01-30 DIAGNOSIS — I10 Essential (primary) hypertension: Secondary | ICD-10-CM | POA: Diagnosis not present

## 2014-01-31 DIAGNOSIS — I619 Nontraumatic intracerebral hemorrhage, unspecified: Secondary | ICD-10-CM | POA: Diagnosis not present

## 2014-01-31 DIAGNOSIS — I10 Essential (primary) hypertension: Secondary | ICD-10-CM | POA: Diagnosis not present

## 2014-01-31 DIAGNOSIS — I824Z9 Acute embolism and thrombosis of unspecified deep veins of unspecified distal lower extremity: Secondary | ICD-10-CM | POA: Diagnosis not present

## 2014-02-01 DIAGNOSIS — I824Z9 Acute embolism and thrombosis of unspecified deep veins of unspecified distal lower extremity: Secondary | ICD-10-CM | POA: Diagnosis not present

## 2014-02-01 DIAGNOSIS — I10 Essential (primary) hypertension: Secondary | ICD-10-CM | POA: Diagnosis not present

## 2014-02-01 DIAGNOSIS — I619 Nontraumatic intracerebral hemorrhage, unspecified: Secondary | ICD-10-CM | POA: Diagnosis not present

## 2014-02-16 DIAGNOSIS — I6789 Other cerebrovascular disease: Secondary | ICD-10-CM | POA: Diagnosis not present

## 2014-04-25 DIAGNOSIS — Z0000 Encounter for general adult medical examination without abnormal findings: Secondary | ICD-10-CM | POA: Diagnosis not present

## 2014-04-25 DIAGNOSIS — Z125 Encounter for screening for malignant neoplasm of prostate: Secondary | ICD-10-CM | POA: Diagnosis not present

## 2014-04-25 DIAGNOSIS — I6789 Other cerebrovascular disease: Secondary | ICD-10-CM | POA: Diagnosis not present

## 2014-05-05 DIAGNOSIS — I10 Essential (primary) hypertension: Secondary | ICD-10-CM | POA: Diagnosis not present

## 2014-05-05 DIAGNOSIS — I619 Nontraumatic intracerebral hemorrhage, unspecified: Secondary | ICD-10-CM | POA: Diagnosis not present

## 2014-05-05 DIAGNOSIS — R296 Repeated falls: Secondary | ICD-10-CM | POA: Diagnosis not present

## 2014-05-08 DIAGNOSIS — I619 Nontraumatic intracerebral hemorrhage, unspecified: Secondary | ICD-10-CM | POA: Diagnosis not present

## 2014-05-08 DIAGNOSIS — R296 Repeated falls: Secondary | ICD-10-CM | POA: Diagnosis not present

## 2014-05-08 DIAGNOSIS — I10 Essential (primary) hypertension: Secondary | ICD-10-CM | POA: Diagnosis not present

## 2014-05-09 DIAGNOSIS — R296 Repeated falls: Secondary | ICD-10-CM | POA: Diagnosis not present

## 2014-05-09 DIAGNOSIS — I10 Essential (primary) hypertension: Secondary | ICD-10-CM | POA: Diagnosis not present

## 2014-05-09 DIAGNOSIS — I619 Nontraumatic intracerebral hemorrhage, unspecified: Secondary | ICD-10-CM | POA: Diagnosis not present

## 2014-05-10 DIAGNOSIS — R296 Repeated falls: Secondary | ICD-10-CM | POA: Diagnosis not present

## 2014-05-10 DIAGNOSIS — I619 Nontraumatic intracerebral hemorrhage, unspecified: Secondary | ICD-10-CM | POA: Diagnosis not present

## 2014-05-10 DIAGNOSIS — I10 Essential (primary) hypertension: Secondary | ICD-10-CM | POA: Diagnosis not present

## 2014-05-11 DIAGNOSIS — R296 Repeated falls: Secondary | ICD-10-CM | POA: Diagnosis not present

## 2014-05-11 DIAGNOSIS — I619 Nontraumatic intracerebral hemorrhage, unspecified: Secondary | ICD-10-CM | POA: Diagnosis not present

## 2014-05-11 DIAGNOSIS — I10 Essential (primary) hypertension: Secondary | ICD-10-CM | POA: Diagnosis not present

## 2014-05-12 DIAGNOSIS — R296 Repeated falls: Secondary | ICD-10-CM | POA: Diagnosis not present

## 2014-05-12 DIAGNOSIS — I10 Essential (primary) hypertension: Secondary | ICD-10-CM | POA: Diagnosis not present

## 2014-05-12 DIAGNOSIS — H1033 Unspecified acute conjunctivitis, bilateral: Secondary | ICD-10-CM | POA: Diagnosis not present

## 2014-05-12 DIAGNOSIS — H01009 Unspecified blepharitis unspecified eye, unspecified eyelid: Secondary | ICD-10-CM | POA: Diagnosis not present

## 2014-05-12 DIAGNOSIS — I619 Nontraumatic intracerebral hemorrhage, unspecified: Secondary | ICD-10-CM | POA: Diagnosis not present

## 2014-05-15 DIAGNOSIS — I10 Essential (primary) hypertension: Secondary | ICD-10-CM | POA: Diagnosis not present

## 2014-05-15 DIAGNOSIS — R296 Repeated falls: Secondary | ICD-10-CM | POA: Diagnosis not present

## 2014-05-15 DIAGNOSIS — I619 Nontraumatic intracerebral hemorrhage, unspecified: Secondary | ICD-10-CM | POA: Diagnosis not present

## 2014-05-16 DIAGNOSIS — I10 Essential (primary) hypertension: Secondary | ICD-10-CM | POA: Diagnosis not present

## 2014-05-16 DIAGNOSIS — R296 Repeated falls: Secondary | ICD-10-CM | POA: Diagnosis not present

## 2014-05-16 DIAGNOSIS — I619 Nontraumatic intracerebral hemorrhage, unspecified: Secondary | ICD-10-CM | POA: Diagnosis not present

## 2014-05-17 DIAGNOSIS — I10 Essential (primary) hypertension: Secondary | ICD-10-CM | POA: Diagnosis not present

## 2014-05-17 DIAGNOSIS — I619 Nontraumatic intracerebral hemorrhage, unspecified: Secondary | ICD-10-CM | POA: Diagnosis not present

## 2014-05-17 DIAGNOSIS — R296 Repeated falls: Secondary | ICD-10-CM | POA: Diagnosis not present

## 2014-05-18 DIAGNOSIS — I619 Nontraumatic intracerebral hemorrhage, unspecified: Secondary | ICD-10-CM | POA: Diagnosis not present

## 2014-05-18 DIAGNOSIS — I10 Essential (primary) hypertension: Secondary | ICD-10-CM | POA: Diagnosis not present

## 2014-05-18 DIAGNOSIS — R296 Repeated falls: Secondary | ICD-10-CM | POA: Diagnosis not present

## 2014-05-19 DIAGNOSIS — I10 Essential (primary) hypertension: Secondary | ICD-10-CM | POA: Diagnosis not present

## 2014-05-19 DIAGNOSIS — R296 Repeated falls: Secondary | ICD-10-CM | POA: Diagnosis not present

## 2014-05-19 DIAGNOSIS — I619 Nontraumatic intracerebral hemorrhage, unspecified: Secondary | ICD-10-CM | POA: Diagnosis not present

## 2014-05-22 DIAGNOSIS — I10 Essential (primary) hypertension: Secondary | ICD-10-CM | POA: Diagnosis not present

## 2014-05-22 DIAGNOSIS — R296 Repeated falls: Secondary | ICD-10-CM | POA: Diagnosis not present

## 2014-05-22 DIAGNOSIS — I619 Nontraumatic intracerebral hemorrhage, unspecified: Secondary | ICD-10-CM | POA: Diagnosis not present

## 2014-05-23 DIAGNOSIS — I10 Essential (primary) hypertension: Secondary | ICD-10-CM | POA: Diagnosis not present

## 2014-05-23 DIAGNOSIS — I619 Nontraumatic intracerebral hemorrhage, unspecified: Secondary | ICD-10-CM | POA: Diagnosis not present

## 2014-05-23 DIAGNOSIS — R296 Repeated falls: Secondary | ICD-10-CM | POA: Diagnosis not present

## 2014-05-24 DIAGNOSIS — R296 Repeated falls: Secondary | ICD-10-CM | POA: Diagnosis not present

## 2014-05-24 DIAGNOSIS — I10 Essential (primary) hypertension: Secondary | ICD-10-CM | POA: Diagnosis not present

## 2014-05-24 DIAGNOSIS — I619 Nontraumatic intracerebral hemorrhage, unspecified: Secondary | ICD-10-CM | POA: Diagnosis not present

## 2014-05-25 DIAGNOSIS — R296 Repeated falls: Secondary | ICD-10-CM | POA: Diagnosis not present

## 2014-05-25 DIAGNOSIS — I619 Nontraumatic intracerebral hemorrhage, unspecified: Secondary | ICD-10-CM | POA: Diagnosis not present

## 2014-05-25 DIAGNOSIS — I10 Essential (primary) hypertension: Secondary | ICD-10-CM | POA: Diagnosis not present

## 2014-05-30 DIAGNOSIS — I10 Essential (primary) hypertension: Secondary | ICD-10-CM | POA: Diagnosis not present

## 2014-05-30 DIAGNOSIS — I619 Nontraumatic intracerebral hemorrhage, unspecified: Secondary | ICD-10-CM | POA: Diagnosis not present

## 2014-05-30 DIAGNOSIS — R296 Repeated falls: Secondary | ICD-10-CM | POA: Diagnosis not present

## 2014-05-31 DIAGNOSIS — I10 Essential (primary) hypertension: Secondary | ICD-10-CM | POA: Diagnosis not present

## 2014-05-31 DIAGNOSIS — I619 Nontraumatic intracerebral hemorrhage, unspecified: Secondary | ICD-10-CM | POA: Diagnosis not present

## 2014-05-31 DIAGNOSIS — R296 Repeated falls: Secondary | ICD-10-CM | POA: Diagnosis not present

## 2014-06-01 DIAGNOSIS — I619 Nontraumatic intracerebral hemorrhage, unspecified: Secondary | ICD-10-CM | POA: Diagnosis not present

## 2014-06-01 DIAGNOSIS — I10 Essential (primary) hypertension: Secondary | ICD-10-CM | POA: Diagnosis not present

## 2014-06-01 DIAGNOSIS — R296 Repeated falls: Secondary | ICD-10-CM | POA: Diagnosis not present

## 2014-06-02 DIAGNOSIS — I619 Nontraumatic intracerebral hemorrhage, unspecified: Secondary | ICD-10-CM | POA: Diagnosis not present

## 2014-06-02 DIAGNOSIS — R296 Repeated falls: Secondary | ICD-10-CM | POA: Diagnosis not present

## 2014-06-02 DIAGNOSIS — I10 Essential (primary) hypertension: Secondary | ICD-10-CM | POA: Diagnosis not present

## 2014-06-05 DIAGNOSIS — I619 Nontraumatic intracerebral hemorrhage, unspecified: Secondary | ICD-10-CM | POA: Diagnosis not present

## 2014-06-05 DIAGNOSIS — I10 Essential (primary) hypertension: Secondary | ICD-10-CM | POA: Diagnosis not present

## 2014-06-05 DIAGNOSIS — R296 Repeated falls: Secondary | ICD-10-CM | POA: Diagnosis not present

## 2014-06-06 DIAGNOSIS — I10 Essential (primary) hypertension: Secondary | ICD-10-CM | POA: Diagnosis not present

## 2014-06-06 DIAGNOSIS — I619 Nontraumatic intracerebral hemorrhage, unspecified: Secondary | ICD-10-CM | POA: Diagnosis not present

## 2014-06-06 DIAGNOSIS — R296 Repeated falls: Secondary | ICD-10-CM | POA: Diagnosis not present

## 2014-06-07 DIAGNOSIS — I10 Essential (primary) hypertension: Secondary | ICD-10-CM | POA: Diagnosis not present

## 2014-06-07 DIAGNOSIS — I619 Nontraumatic intracerebral hemorrhage, unspecified: Secondary | ICD-10-CM | POA: Diagnosis not present

## 2014-06-07 DIAGNOSIS — R296 Repeated falls: Secondary | ICD-10-CM | POA: Diagnosis not present

## 2014-06-08 DIAGNOSIS — R296 Repeated falls: Secondary | ICD-10-CM | POA: Diagnosis not present

## 2014-06-08 DIAGNOSIS — I10 Essential (primary) hypertension: Secondary | ICD-10-CM | POA: Diagnosis not present

## 2014-06-08 DIAGNOSIS — I619 Nontraumatic intracerebral hemorrhage, unspecified: Secondary | ICD-10-CM | POA: Diagnosis not present

## 2014-06-09 DIAGNOSIS — R296 Repeated falls: Secondary | ICD-10-CM | POA: Diagnosis not present

## 2014-06-09 DIAGNOSIS — I10 Essential (primary) hypertension: Secondary | ICD-10-CM | POA: Diagnosis not present

## 2014-06-09 DIAGNOSIS — I619 Nontraumatic intracerebral hemorrhage, unspecified: Secondary | ICD-10-CM | POA: Diagnosis not present

## 2014-06-12 DIAGNOSIS — R296 Repeated falls: Secondary | ICD-10-CM | POA: Diagnosis not present

## 2014-06-12 DIAGNOSIS — I619 Nontraumatic intracerebral hemorrhage, unspecified: Secondary | ICD-10-CM | POA: Diagnosis not present

## 2014-06-12 DIAGNOSIS — I10 Essential (primary) hypertension: Secondary | ICD-10-CM | POA: Diagnosis not present

## 2014-06-13 DIAGNOSIS — I10 Essential (primary) hypertension: Secondary | ICD-10-CM | POA: Diagnosis not present

## 2014-06-13 DIAGNOSIS — R296 Repeated falls: Secondary | ICD-10-CM | POA: Diagnosis not present

## 2014-06-13 DIAGNOSIS — I619 Nontraumatic intracerebral hemorrhage, unspecified: Secondary | ICD-10-CM | POA: Diagnosis not present

## 2014-06-14 DIAGNOSIS — R296 Repeated falls: Secondary | ICD-10-CM | POA: Diagnosis not present

## 2014-06-14 DIAGNOSIS — I619 Nontraumatic intracerebral hemorrhage, unspecified: Secondary | ICD-10-CM | POA: Diagnosis not present

## 2014-06-14 DIAGNOSIS — I10 Essential (primary) hypertension: Secondary | ICD-10-CM | POA: Diagnosis not present

## 2014-06-15 DIAGNOSIS — R296 Repeated falls: Secondary | ICD-10-CM | POA: Diagnosis not present

## 2014-06-15 DIAGNOSIS — I619 Nontraumatic intracerebral hemorrhage, unspecified: Secondary | ICD-10-CM | POA: Diagnosis not present

## 2014-06-15 DIAGNOSIS — I10 Essential (primary) hypertension: Secondary | ICD-10-CM | POA: Diagnosis not present

## 2014-06-16 DIAGNOSIS — I10 Essential (primary) hypertension: Secondary | ICD-10-CM | POA: Diagnosis not present

## 2014-06-16 DIAGNOSIS — I619 Nontraumatic intracerebral hemorrhage, unspecified: Secondary | ICD-10-CM | POA: Diagnosis not present

## 2014-06-16 DIAGNOSIS — R296 Repeated falls: Secondary | ICD-10-CM | POA: Diagnosis not present

## 2014-06-19 DIAGNOSIS — I619 Nontraumatic intracerebral hemorrhage, unspecified: Secondary | ICD-10-CM | POA: Diagnosis not present

## 2014-06-19 DIAGNOSIS — R296 Repeated falls: Secondary | ICD-10-CM | POA: Diagnosis not present

## 2014-06-19 DIAGNOSIS — I10 Essential (primary) hypertension: Secondary | ICD-10-CM | POA: Diagnosis not present

## 2014-06-20 DIAGNOSIS — R296 Repeated falls: Secondary | ICD-10-CM | POA: Diagnosis not present

## 2014-06-20 DIAGNOSIS — I619 Nontraumatic intracerebral hemorrhage, unspecified: Secondary | ICD-10-CM | POA: Diagnosis not present

## 2014-06-20 DIAGNOSIS — I10 Essential (primary) hypertension: Secondary | ICD-10-CM | POA: Diagnosis not present

## 2014-06-21 DIAGNOSIS — I10 Essential (primary) hypertension: Secondary | ICD-10-CM | POA: Diagnosis not present

## 2014-06-21 DIAGNOSIS — I619 Nontraumatic intracerebral hemorrhage, unspecified: Secondary | ICD-10-CM | POA: Diagnosis not present

## 2014-06-21 DIAGNOSIS — R296 Repeated falls: Secondary | ICD-10-CM | POA: Diagnosis not present

## 2014-06-22 DIAGNOSIS — R10814 Left lower quadrant abdominal tenderness: Secondary | ICD-10-CM | POA: Diagnosis not present

## 2014-06-22 DIAGNOSIS — R1084 Generalized abdominal pain: Secondary | ICD-10-CM | POA: Diagnosis not present

## 2014-06-22 DIAGNOSIS — K2970 Gastritis, unspecified, without bleeding: Secondary | ICD-10-CM | POA: Diagnosis not present

## 2014-06-22 DIAGNOSIS — G8190 Hemiplegia, unspecified affecting unspecified side: Secondary | ICD-10-CM | POA: Diagnosis not present

## 2014-06-22 DIAGNOSIS — R10812 Left upper quadrant abdominal tenderness: Secondary | ICD-10-CM | POA: Diagnosis not present

## 2014-06-22 DIAGNOSIS — R296 Repeated falls: Secondary | ICD-10-CM | POA: Diagnosis not present

## 2014-06-22 DIAGNOSIS — I10 Essential (primary) hypertension: Secondary | ICD-10-CM | POA: Diagnosis not present

## 2014-06-22 DIAGNOSIS — K5730 Diverticulosis of large intestine without perforation or abscess without bleeding: Secondary | ICD-10-CM | POA: Diagnosis not present

## 2014-06-22 DIAGNOSIS — F329 Major depressive disorder, single episode, unspecified: Secondary | ICD-10-CM | POA: Diagnosis not present

## 2014-06-22 DIAGNOSIS — N179 Acute kidney failure, unspecified: Secondary | ICD-10-CM | POA: Diagnosis not present

## 2014-06-22 DIAGNOSIS — K4020 Bilateral inguinal hernia, without obstruction or gangrene, not specified as recurrent: Secondary | ICD-10-CM | POA: Diagnosis not present

## 2014-06-22 DIAGNOSIS — I619 Nontraumatic intracerebral hemorrhage, unspecified: Secondary | ICD-10-CM | POA: Diagnosis not present

## 2014-06-22 DIAGNOSIS — M109 Gout, unspecified: Secondary | ICD-10-CM | POA: Diagnosis not present

## 2014-06-22 DIAGNOSIS — K5792 Diverticulitis of intestine, part unspecified, without perforation or abscess without bleeding: Secondary | ICD-10-CM | POA: Diagnosis not present

## 2014-06-22 DIAGNOSIS — K449 Diaphragmatic hernia without obstruction or gangrene: Secondary | ICD-10-CM | POA: Diagnosis not present

## 2014-06-22 DIAGNOSIS — I638 Other cerebral infarction: Secondary | ICD-10-CM | POA: Diagnosis not present

## 2014-06-23 DIAGNOSIS — G8190 Hemiplegia, unspecified affecting unspecified side: Secondary | ICD-10-CM | POA: Diagnosis not present

## 2014-06-23 DIAGNOSIS — N179 Acute kidney failure, unspecified: Secondary | ICD-10-CM | POA: Diagnosis not present

## 2014-06-23 DIAGNOSIS — I10 Essential (primary) hypertension: Secondary | ICD-10-CM | POA: Diagnosis not present

## 2014-06-23 DIAGNOSIS — R296 Repeated falls: Secondary | ICD-10-CM | POA: Diagnosis not present

## 2014-06-23 DIAGNOSIS — I619 Nontraumatic intracerebral hemorrhage, unspecified: Secondary | ICD-10-CM | POA: Diagnosis not present

## 2014-06-23 DIAGNOSIS — K5792 Diverticulitis of intestine, part unspecified, without perforation or abscess without bleeding: Secondary | ICD-10-CM | POA: Diagnosis not present

## 2014-06-23 DIAGNOSIS — I638 Other cerebral infarction: Secondary | ICD-10-CM | POA: Diagnosis not present

## 2014-06-24 DIAGNOSIS — I10 Essential (primary) hypertension: Secondary | ICD-10-CM | POA: Diagnosis not present

## 2014-06-24 DIAGNOSIS — K5792 Diverticulitis of intestine, part unspecified, without perforation or abscess without bleeding: Secondary | ICD-10-CM | POA: Diagnosis not present

## 2014-06-24 DIAGNOSIS — N179 Acute kidney failure, unspecified: Secondary | ICD-10-CM | POA: Diagnosis not present

## 2014-06-24 DIAGNOSIS — G8190 Hemiplegia, unspecified affecting unspecified side: Secondary | ICD-10-CM | POA: Diagnosis not present

## 2014-06-24 DIAGNOSIS — I638 Other cerebral infarction: Secondary | ICD-10-CM | POA: Diagnosis not present

## 2014-06-30 DIAGNOSIS — K5792 Diverticulitis of intestine, part unspecified, without perforation or abscess without bleeding: Secondary | ICD-10-CM | POA: Diagnosis not present

## 2014-08-11 DIAGNOSIS — H00029 Hordeolum internum unspecified eye, unspecified eyelid: Secondary | ICD-10-CM | POA: Diagnosis not present

## 2014-08-14 DIAGNOSIS — I10 Essential (primary) hypertension: Secondary | ICD-10-CM | POA: Diagnosis not present

## 2014-08-14 DIAGNOSIS — I6350 Cerebral infarction due to unspecified occlusion or stenosis of unspecified cerebral artery: Secondary | ICD-10-CM | POA: Diagnosis not present

## 2014-09-21 DIAGNOSIS — H00029 Hordeolum internum unspecified eye, unspecified eyelid: Secondary | ICD-10-CM | POA: Diagnosis not present

## 2014-10-30 DIAGNOSIS — I6350 Cerebral infarction due to unspecified occlusion or stenosis of unspecified cerebral artery: Secondary | ICD-10-CM | POA: Diagnosis not present

## 2014-10-30 DIAGNOSIS — Z131 Encounter for screening for diabetes mellitus: Secondary | ICD-10-CM | POA: Diagnosis not present

## 2014-10-30 DIAGNOSIS — M791 Myalgia: Secondary | ICD-10-CM | POA: Diagnosis not present

## 2014-10-30 DIAGNOSIS — I10 Essential (primary) hypertension: Secondary | ICD-10-CM | POA: Diagnosis not present

## 2015-01-23 DIAGNOSIS — K5790 Diverticulosis of intestine, part unspecified, without perforation or abscess without bleeding: Secondary | ICD-10-CM | POA: Diagnosis not present

## 2015-01-23 DIAGNOSIS — J209 Acute bronchitis, unspecified: Secondary | ICD-10-CM | POA: Diagnosis not present

## 2015-01-23 DIAGNOSIS — I10 Essential (primary) hypertension: Secondary | ICD-10-CM | POA: Diagnosis not present

## 2015-01-23 DIAGNOSIS — R1032 Left lower quadrant pain: Secondary | ICD-10-CM | POA: Diagnosis not present

## 2015-01-23 DIAGNOSIS — R062 Wheezing: Secondary | ICD-10-CM | POA: Diagnosis not present

## 2015-01-23 DIAGNOSIS — Z79899 Other long term (current) drug therapy: Secondary | ICD-10-CM | POA: Diagnosis not present

## 2015-01-23 DIAGNOSIS — I69898 Other sequelae of other cerebrovascular disease: Secondary | ICD-10-CM | POA: Diagnosis not present

## 2015-01-23 DIAGNOSIS — R0602 Shortness of breath: Secondary | ICD-10-CM | POA: Diagnosis not present

## 2015-01-23 DIAGNOSIS — I69851 Hemiplegia and hemiparesis following other cerebrovascular disease affecting right dominant side: Secondary | ICD-10-CM | POA: Diagnosis not present

## 2015-01-23 DIAGNOSIS — I700 Atherosclerosis of aorta: Secondary | ICD-10-CM | POA: Diagnosis not present

## 2015-01-23 DIAGNOSIS — I69820 Aphasia following other cerebrovascular disease: Secondary | ICD-10-CM | POA: Diagnosis not present

## 2015-02-01 DIAGNOSIS — M545 Low back pain: Secondary | ICD-10-CM | POA: Diagnosis not present

## 2015-02-01 DIAGNOSIS — K210 Gastro-esophageal reflux disease with esophagitis: Secondary | ICD-10-CM | POA: Diagnosis not present

## 2015-02-01 DIAGNOSIS — I10 Essential (primary) hypertension: Secondary | ICD-10-CM | POA: Diagnosis not present

## 2015-02-01 DIAGNOSIS — N29 Other disorders of kidney and ureter in diseases classified elsewhere: Secondary | ICD-10-CM | POA: Diagnosis not present

## 2015-02-05 DIAGNOSIS — K5730 Diverticulosis of large intestine without perforation or abscess without bleeding: Secondary | ICD-10-CM | POA: Diagnosis not present

## 2015-02-05 DIAGNOSIS — Z79899 Other long term (current) drug therapy: Secondary | ICD-10-CM | POA: Diagnosis not present

## 2015-02-05 DIAGNOSIS — R1032 Left lower quadrant pain: Secondary | ICD-10-CM | POA: Diagnosis not present

## 2015-02-05 DIAGNOSIS — K5900 Constipation, unspecified: Secondary | ICD-10-CM | POA: Diagnosis not present

## 2015-02-05 DIAGNOSIS — R1031 Right lower quadrant pain: Secondary | ICD-10-CM | POA: Diagnosis not present

## 2015-02-05 DIAGNOSIS — N179 Acute kidney failure, unspecified: Secondary | ICD-10-CM | POA: Diagnosis not present

## 2015-02-05 DIAGNOSIS — Z8673 Personal history of transient ischemic attack (TIA), and cerebral infarction without residual deficits: Secondary | ICD-10-CM | POA: Diagnosis not present

## 2015-02-05 DIAGNOSIS — K5790 Diverticulosis of intestine, part unspecified, without perforation or abscess without bleeding: Secondary | ICD-10-CM | POA: Diagnosis not present

## 2015-05-10 DIAGNOSIS — I10 Essential (primary) hypertension: Secondary | ICD-10-CM | POA: Diagnosis not present

## 2015-05-10 DIAGNOSIS — I6789 Other cerebrovascular disease: Secondary | ICD-10-CM | POA: Diagnosis not present

## 2015-05-10 DIAGNOSIS — Z1389 Encounter for screening for other disorder: Secondary | ICD-10-CM | POA: Diagnosis not present

## 2015-05-10 DIAGNOSIS — Z0000 Encounter for general adult medical examination without abnormal findings: Secondary | ICD-10-CM | POA: Diagnosis not present

## 2015-05-17 DIAGNOSIS — Z125 Encounter for screening for malignant neoplasm of prostate: Secondary | ICD-10-CM | POA: Diagnosis not present

## 2015-05-17 DIAGNOSIS — Z1389 Encounter for screening for other disorder: Secondary | ICD-10-CM | POA: Diagnosis not present

## 2015-05-17 DIAGNOSIS — Z131 Encounter for screening for diabetes mellitus: Secondary | ICD-10-CM | POA: Diagnosis not present

## 2015-05-17 DIAGNOSIS — I6789 Other cerebrovascular disease: Secondary | ICD-10-CM | POA: Diagnosis not present

## 2015-05-17 DIAGNOSIS — Z0000 Encounter for general adult medical examination without abnormal findings: Secondary | ICD-10-CM | POA: Diagnosis not present

## 2015-06-18 DIAGNOSIS — F329 Major depressive disorder, single episode, unspecified: Secondary | ICD-10-CM | POA: Diagnosis not present

## 2015-06-18 DIAGNOSIS — N189 Chronic kidney disease, unspecified: Secondary | ICD-10-CM | POA: Diagnosis not present

## 2015-06-18 DIAGNOSIS — G8111 Spastic hemiplegia affecting right dominant side: Secondary | ICD-10-CM | POA: Diagnosis not present

## 2015-06-18 DIAGNOSIS — M4726 Other spondylosis with radiculopathy, lumbar region: Secondary | ICD-10-CM | POA: Diagnosis not present

## 2015-06-18 DIAGNOSIS — K449 Diaphragmatic hernia without obstruction or gangrene: Secondary | ICD-10-CM | POA: Diagnosis not present

## 2015-06-18 DIAGNOSIS — S3993XA Unspecified injury of pelvis, initial encounter: Secondary | ICD-10-CM | POA: Diagnosis not present

## 2015-06-18 DIAGNOSIS — G8191 Hemiplegia, unspecified affecting right dominant side: Secondary | ICD-10-CM | POA: Diagnosis not present

## 2015-06-18 DIAGNOSIS — N183 Chronic kidney disease, stage 3 (moderate): Secondary | ICD-10-CM | POA: Diagnosis not present

## 2015-06-18 DIAGNOSIS — F3289 Other specified depressive episodes: Secondary | ICD-10-CM | POA: Diagnosis not present

## 2015-06-18 DIAGNOSIS — I629 Nontraumatic intracranial hemorrhage, unspecified: Secondary | ICD-10-CM | POA: Diagnosis not present

## 2015-06-18 DIAGNOSIS — M79605 Pain in left leg: Secondary | ICD-10-CM | POA: Diagnosis not present

## 2015-06-18 DIAGNOSIS — S79912A Unspecified injury of left hip, initial encounter: Secondary | ICD-10-CM | POA: Diagnosis not present

## 2015-06-18 DIAGNOSIS — M25552 Pain in left hip: Secondary | ICD-10-CM | POA: Diagnosis not present

## 2015-06-18 DIAGNOSIS — M109 Gout, unspecified: Secondary | ICD-10-CM | POA: Diagnosis not present

## 2015-06-18 DIAGNOSIS — Z880 Allergy status to penicillin: Secondary | ICD-10-CM | POA: Diagnosis not present

## 2015-06-18 DIAGNOSIS — I129 Hypertensive chronic kidney disease with stage 1 through stage 4 chronic kidney disease, or unspecified chronic kidney disease: Secondary | ICD-10-CM | POA: Diagnosis not present

## 2015-06-18 DIAGNOSIS — Z888 Allergy status to other drugs, medicaments and biological substances status: Secondary | ICD-10-CM | POA: Diagnosis not present

## 2015-06-18 DIAGNOSIS — R262 Difficulty in walking, not elsewhere classified: Secondary | ICD-10-CM | POA: Diagnosis not present

## 2015-06-19 DIAGNOSIS — M25552 Pain in left hip: Secondary | ICD-10-CM | POA: Diagnosis not present

## 2015-06-19 DIAGNOSIS — S3992XA Unspecified injury of lower back, initial encounter: Secondary | ICD-10-CM | POA: Diagnosis not present

## 2015-06-19 DIAGNOSIS — G8111 Spastic hemiplegia affecting right dominant side: Secondary | ICD-10-CM | POA: Diagnosis not present

## 2015-06-19 DIAGNOSIS — N183 Chronic kidney disease, stage 3 (moderate): Secondary | ICD-10-CM | POA: Diagnosis not present

## 2015-06-19 DIAGNOSIS — M4726 Other spondylosis with radiculopathy, lumbar region: Secondary | ICD-10-CM | POA: Diagnosis not present

## 2015-06-19 DIAGNOSIS — K449 Diaphragmatic hernia without obstruction or gangrene: Secondary | ICD-10-CM | POA: Diagnosis not present

## 2015-06-19 DIAGNOSIS — F3289 Other specified depressive episodes: Secondary | ICD-10-CM | POA: Diagnosis not present

## 2015-06-20 DIAGNOSIS — M25552 Pain in left hip: Secondary | ICD-10-CM | POA: Diagnosis not present

## 2015-06-20 DIAGNOSIS — R600 Localized edema: Secondary | ICD-10-CM | POA: Diagnosis not present

## 2015-06-21 DIAGNOSIS — F3289 Other specified depressive episodes: Secondary | ICD-10-CM | POA: Diagnosis not present

## 2015-06-21 DIAGNOSIS — Z7401 Bed confinement status: Secondary | ICD-10-CM | POA: Diagnosis not present

## 2015-06-21 DIAGNOSIS — M4726 Other spondylosis with radiculopathy, lumbar region: Secondary | ICD-10-CM | POA: Diagnosis not present

## 2015-06-21 DIAGNOSIS — K449 Diaphragmatic hernia without obstruction or gangrene: Secondary | ICD-10-CM | POA: Diagnosis not present

## 2015-06-21 DIAGNOSIS — G8111 Spastic hemiplegia affecting right dominant side: Secondary | ICD-10-CM | POA: Diagnosis not present

## 2015-06-21 DIAGNOSIS — N183 Chronic kidney disease, stage 3 (moderate): Secondary | ICD-10-CM | POA: Diagnosis not present

## 2015-06-21 DIAGNOSIS — R279 Unspecified lack of coordination: Secondary | ICD-10-CM | POA: Diagnosis not present

## 2015-07-12 DIAGNOSIS — I10 Essential (primary) hypertension: Secondary | ICD-10-CM | POA: Diagnosis not present

## 2015-07-12 DIAGNOSIS — M1009 Idiopathic gout, multiple sites: Secondary | ICD-10-CM | POA: Diagnosis not present

## 2015-07-12 DIAGNOSIS — M545 Low back pain: Secondary | ICD-10-CM | POA: Diagnosis not present

## 2015-07-12 DIAGNOSIS — I6789 Other cerebrovascular disease: Secondary | ICD-10-CM | POA: Diagnosis not present

## 2015-09-24 DIAGNOSIS — R279 Unspecified lack of coordination: Secondary | ICD-10-CM | POA: Diagnosis not present

## 2015-09-24 DIAGNOSIS — I10 Essential (primary) hypertension: Secondary | ICD-10-CM | POA: Diagnosis not present

## 2015-09-24 DIAGNOSIS — I619 Nontraumatic intracerebral hemorrhage, unspecified: Secondary | ICD-10-CM | POA: Diagnosis not present

## 2015-09-24 DIAGNOSIS — G4733 Obstructive sleep apnea (adult) (pediatric): Secondary | ICD-10-CM | POA: Diagnosis not present

## 2015-09-24 DIAGNOSIS — M6281 Muscle weakness (generalized): Secondary | ICD-10-CM | POA: Diagnosis not present

## 2015-09-25 DIAGNOSIS — I619 Nontraumatic intracerebral hemorrhage, unspecified: Secondary | ICD-10-CM | POA: Diagnosis not present

## 2015-09-25 DIAGNOSIS — G4733 Obstructive sleep apnea (adult) (pediatric): Secondary | ICD-10-CM | POA: Diagnosis not present

## 2015-09-25 DIAGNOSIS — R279 Unspecified lack of coordination: Secondary | ICD-10-CM | POA: Diagnosis not present

## 2015-09-25 DIAGNOSIS — I10 Essential (primary) hypertension: Secondary | ICD-10-CM | POA: Diagnosis not present

## 2015-09-25 DIAGNOSIS — M6281 Muscle weakness (generalized): Secondary | ICD-10-CM | POA: Diagnosis not present

## 2015-09-26 DIAGNOSIS — R279 Unspecified lack of coordination: Secondary | ICD-10-CM | POA: Diagnosis not present

## 2015-09-26 DIAGNOSIS — M6281 Muscle weakness (generalized): Secondary | ICD-10-CM | POA: Diagnosis not present

## 2015-09-26 DIAGNOSIS — G4733 Obstructive sleep apnea (adult) (pediatric): Secondary | ICD-10-CM | POA: Diagnosis not present

## 2015-09-26 DIAGNOSIS — I10 Essential (primary) hypertension: Secondary | ICD-10-CM | POA: Diagnosis not present

## 2015-09-26 DIAGNOSIS — I619 Nontraumatic intracerebral hemorrhage, unspecified: Secondary | ICD-10-CM | POA: Diagnosis not present

## 2015-09-27 DIAGNOSIS — I619 Nontraumatic intracerebral hemorrhage, unspecified: Secondary | ICD-10-CM | POA: Diagnosis not present

## 2015-09-27 DIAGNOSIS — G4733 Obstructive sleep apnea (adult) (pediatric): Secondary | ICD-10-CM | POA: Diagnosis not present

## 2015-09-27 DIAGNOSIS — M6281 Muscle weakness (generalized): Secondary | ICD-10-CM | POA: Diagnosis not present

## 2015-09-27 DIAGNOSIS — I10 Essential (primary) hypertension: Secondary | ICD-10-CM | POA: Diagnosis not present

## 2015-09-27 DIAGNOSIS — R279 Unspecified lack of coordination: Secondary | ICD-10-CM | POA: Diagnosis not present

## 2015-09-28 DIAGNOSIS — I10 Essential (primary) hypertension: Secondary | ICD-10-CM | POA: Diagnosis not present

## 2015-09-28 DIAGNOSIS — I619 Nontraumatic intracerebral hemorrhage, unspecified: Secondary | ICD-10-CM | POA: Diagnosis not present

## 2015-09-28 DIAGNOSIS — R279 Unspecified lack of coordination: Secondary | ICD-10-CM | POA: Diagnosis not present

## 2015-09-28 DIAGNOSIS — G4733 Obstructive sleep apnea (adult) (pediatric): Secondary | ICD-10-CM | POA: Diagnosis not present

## 2015-09-28 DIAGNOSIS — M6281 Muscle weakness (generalized): Secondary | ICD-10-CM | POA: Diagnosis not present

## 2015-10-01 DIAGNOSIS — I619 Nontraumatic intracerebral hemorrhage, unspecified: Secondary | ICD-10-CM | POA: Diagnosis not present

## 2015-10-01 DIAGNOSIS — R279 Unspecified lack of coordination: Secondary | ICD-10-CM | POA: Diagnosis not present

## 2015-10-01 DIAGNOSIS — I10 Essential (primary) hypertension: Secondary | ICD-10-CM | POA: Diagnosis not present

## 2015-10-01 DIAGNOSIS — G4733 Obstructive sleep apnea (adult) (pediatric): Secondary | ICD-10-CM | POA: Diagnosis not present

## 2015-10-01 DIAGNOSIS — M6281 Muscle weakness (generalized): Secondary | ICD-10-CM | POA: Diagnosis not present

## 2015-10-02 DIAGNOSIS — G4733 Obstructive sleep apnea (adult) (pediatric): Secondary | ICD-10-CM | POA: Diagnosis not present

## 2015-10-02 DIAGNOSIS — R279 Unspecified lack of coordination: Secondary | ICD-10-CM | POA: Diagnosis not present

## 2015-10-02 DIAGNOSIS — I10 Essential (primary) hypertension: Secondary | ICD-10-CM | POA: Diagnosis not present

## 2015-10-02 DIAGNOSIS — M6281 Muscle weakness (generalized): Secondary | ICD-10-CM | POA: Diagnosis not present

## 2015-10-02 DIAGNOSIS — I619 Nontraumatic intracerebral hemorrhage, unspecified: Secondary | ICD-10-CM | POA: Diagnosis not present

## 2015-10-03 DIAGNOSIS — M6281 Muscle weakness (generalized): Secondary | ICD-10-CM | POA: Diagnosis not present

## 2015-10-03 DIAGNOSIS — I10 Essential (primary) hypertension: Secondary | ICD-10-CM | POA: Diagnosis not present

## 2015-10-03 DIAGNOSIS — I619 Nontraumatic intracerebral hemorrhage, unspecified: Secondary | ICD-10-CM | POA: Diagnosis not present

## 2015-10-03 DIAGNOSIS — R279 Unspecified lack of coordination: Secondary | ICD-10-CM | POA: Diagnosis not present

## 2015-10-03 DIAGNOSIS — G4733 Obstructive sleep apnea (adult) (pediatric): Secondary | ICD-10-CM | POA: Diagnosis not present

## 2015-10-04 DIAGNOSIS — I10 Essential (primary) hypertension: Secondary | ICD-10-CM | POA: Diagnosis not present

## 2015-10-04 DIAGNOSIS — M6281 Muscle weakness (generalized): Secondary | ICD-10-CM | POA: Diagnosis not present

## 2015-10-04 DIAGNOSIS — R279 Unspecified lack of coordination: Secondary | ICD-10-CM | POA: Diagnosis not present

## 2015-10-04 DIAGNOSIS — G4733 Obstructive sleep apnea (adult) (pediatric): Secondary | ICD-10-CM | POA: Diagnosis not present

## 2015-10-04 DIAGNOSIS — I619 Nontraumatic intracerebral hemorrhage, unspecified: Secondary | ICD-10-CM | POA: Diagnosis not present

## 2015-10-05 DIAGNOSIS — G4733 Obstructive sleep apnea (adult) (pediatric): Secondary | ICD-10-CM | POA: Diagnosis not present

## 2015-10-05 DIAGNOSIS — R279 Unspecified lack of coordination: Secondary | ICD-10-CM | POA: Diagnosis not present

## 2015-10-05 DIAGNOSIS — I10 Essential (primary) hypertension: Secondary | ICD-10-CM | POA: Diagnosis not present

## 2015-10-05 DIAGNOSIS — I619 Nontraumatic intracerebral hemorrhage, unspecified: Secondary | ICD-10-CM | POA: Diagnosis not present

## 2015-10-05 DIAGNOSIS — M6281 Muscle weakness (generalized): Secondary | ICD-10-CM | POA: Diagnosis not present

## 2015-10-12 DIAGNOSIS — M545 Low back pain: Secondary | ICD-10-CM | POA: Diagnosis not present

## 2015-10-12 DIAGNOSIS — I10 Essential (primary) hypertension: Secondary | ICD-10-CM | POA: Diagnosis not present

## 2015-10-12 DIAGNOSIS — M1009 Idiopathic gout, multiple sites: Secondary | ICD-10-CM | POA: Diagnosis not present

## 2015-10-12 DIAGNOSIS — I6789 Other cerebrovascular disease: Secondary | ICD-10-CM | POA: Diagnosis not present

## 2015-10-12 DIAGNOSIS — M1711 Unilateral primary osteoarthritis, right knee: Secondary | ICD-10-CM | POA: Diagnosis not present

## 2015-10-18 DIAGNOSIS — M1009 Idiopathic gout, multiple sites: Secondary | ICD-10-CM | POA: Diagnosis not present

## 2015-10-18 DIAGNOSIS — I6789 Other cerebrovascular disease: Secondary | ICD-10-CM | POA: Diagnosis not present

## 2015-10-18 DIAGNOSIS — I10 Essential (primary) hypertension: Secondary | ICD-10-CM | POA: Diagnosis not present

## 2015-10-18 DIAGNOSIS — M545 Low back pain: Secondary | ICD-10-CM | POA: Diagnosis not present

## 2015-12-05 DIAGNOSIS — I10 Essential (primary) hypertension: Secondary | ICD-10-CM | POA: Diagnosis not present

## 2015-12-05 DIAGNOSIS — I6789 Other cerebrovascular disease: Secondary | ICD-10-CM | POA: Diagnosis not present

## 2015-12-05 DIAGNOSIS — M1009 Idiopathic gout, multiple sites: Secondary | ICD-10-CM | POA: Diagnosis not present

## 2015-12-05 DIAGNOSIS — M545 Low back pain: Secondary | ICD-10-CM | POA: Diagnosis not present

## 2015-12-18 DIAGNOSIS — I10 Essential (primary) hypertension: Secondary | ICD-10-CM | POA: Diagnosis not present

## 2015-12-18 DIAGNOSIS — M545 Low back pain: Secondary | ICD-10-CM | POA: Diagnosis not present

## 2015-12-18 DIAGNOSIS — I6789 Other cerebrovascular disease: Secondary | ICD-10-CM | POA: Diagnosis not present

## 2015-12-18 DIAGNOSIS — M1009 Idiopathic gout, multiple sites: Secondary | ICD-10-CM | POA: Diagnosis not present

## 2015-12-18 IMAGING — CR DG KNEE 1-2V PORT*R*
2 series · 2 of 2 positions shown · non-contrast
Comparison: None.

CLINICAL DATA: Swollen right knee.  No known injury.

EXAM:
PORTABLE RIGHT KNEE - 1-2 VIEW

[AP]
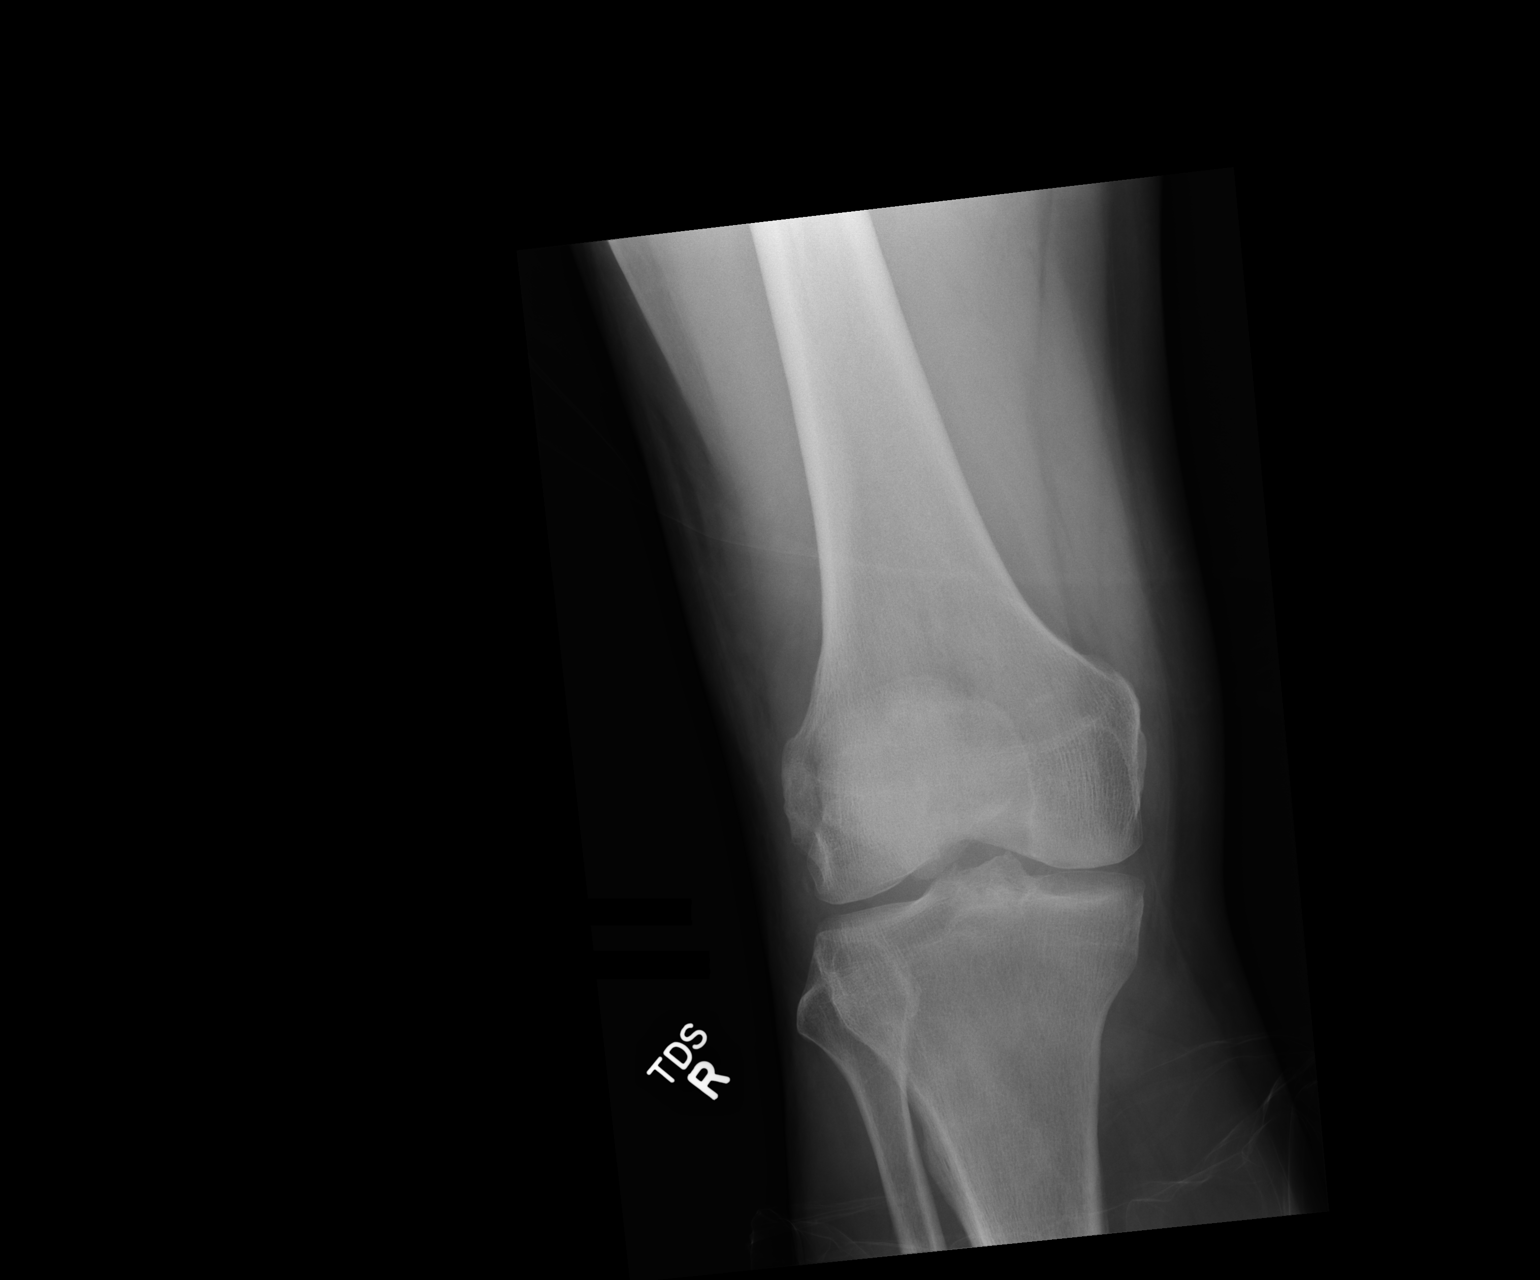

[xtable lateral]
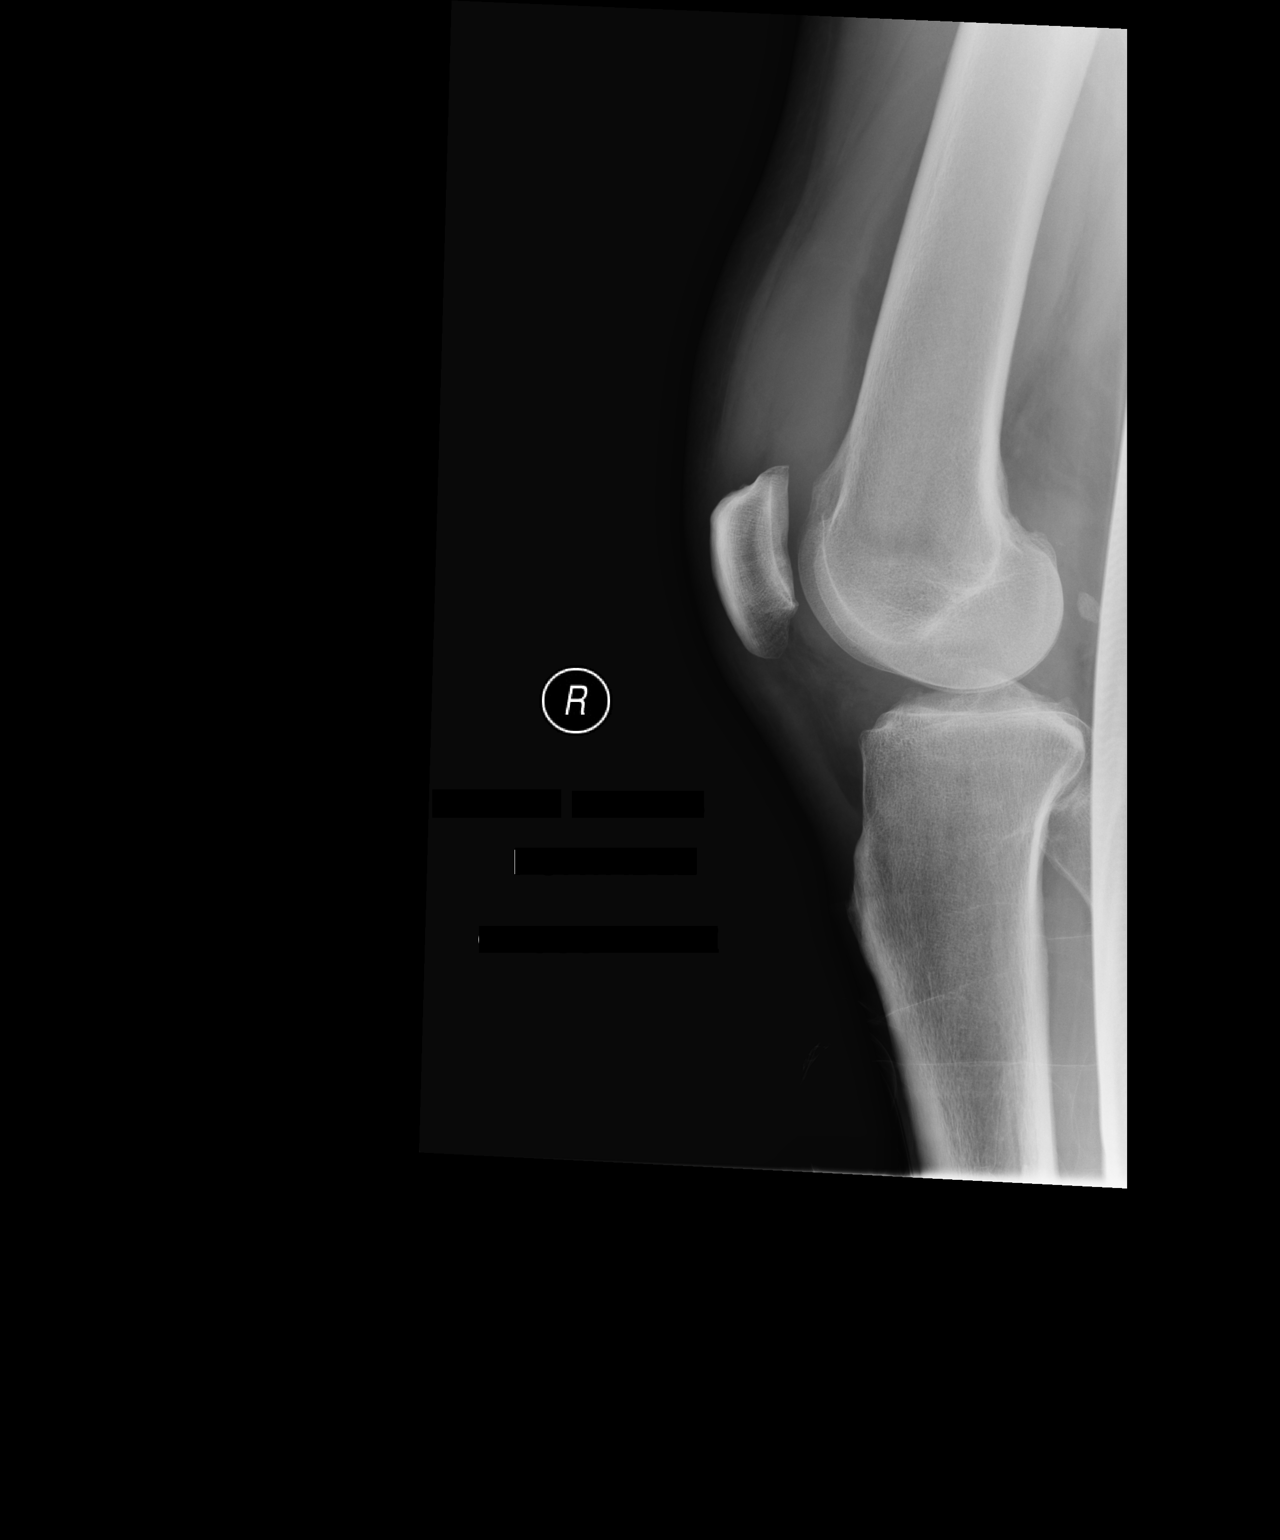

[2 of 2 positions shown; findings below may reference images not displayed]

FINDINGS: There is a large suprapatellar joint effusion No foreign bodies are
identified. There is no evidence of acute fracture, dislocation or
bone destruction. Minimal patellofemoral spurring is noted.
IMPRESSION: Large knee joint effusion.  No acute osseous findings.

## 2015-12-22 IMAGING — CR DG KNEE 1-2V*R*
2 series · 2 of 2 positions shown · non-contrast
Comparison: Portable views of the right knee dated May 07, 2012.

CLINICAL DATA: Right knee pain, altered mental status.

EXAM:
RIGHT KNEE - 1-2 VIEW

[x knee ap right]
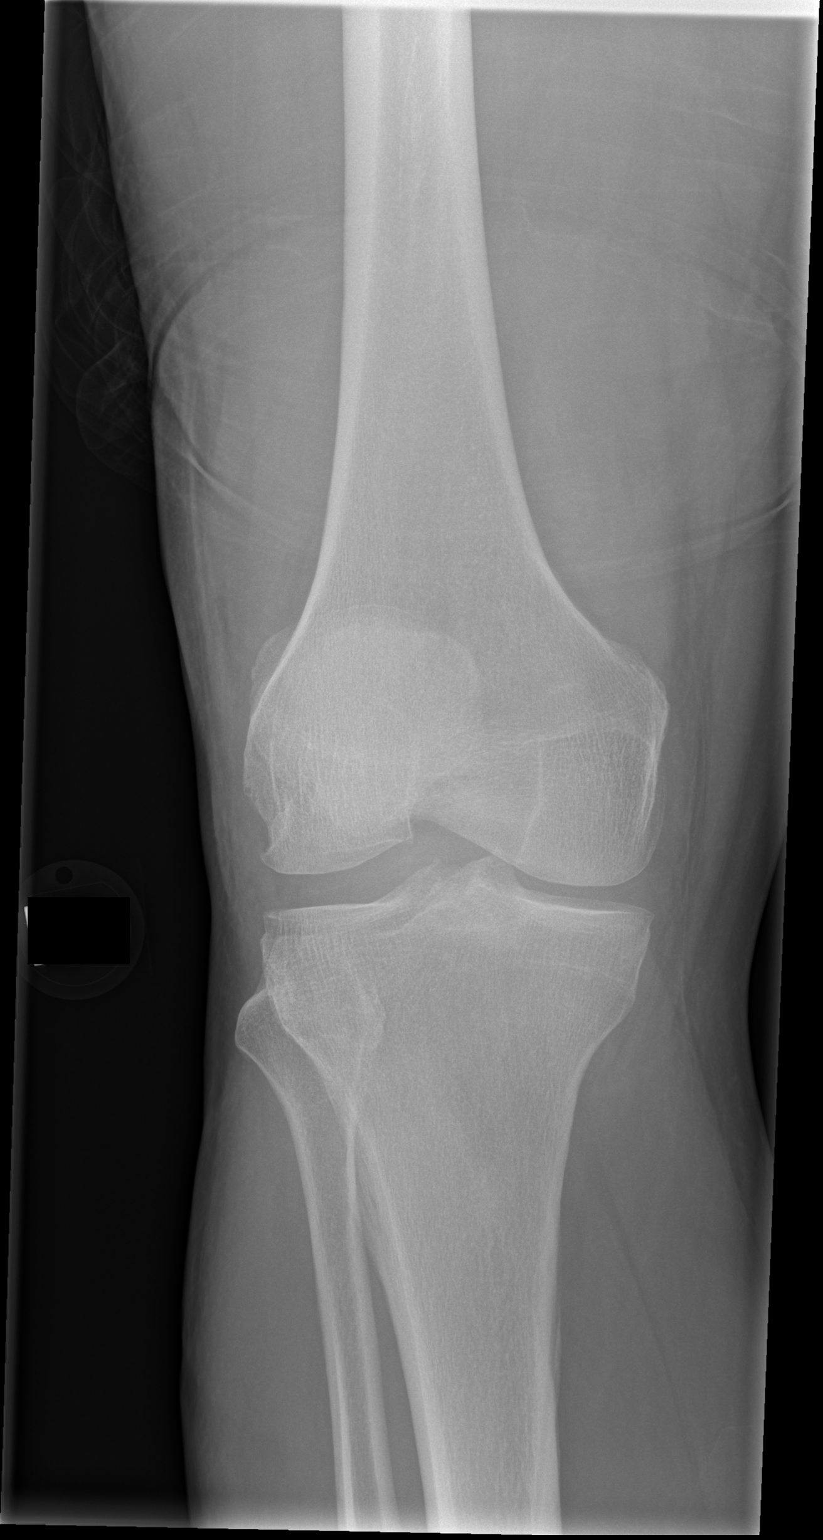

[x knee lat right]
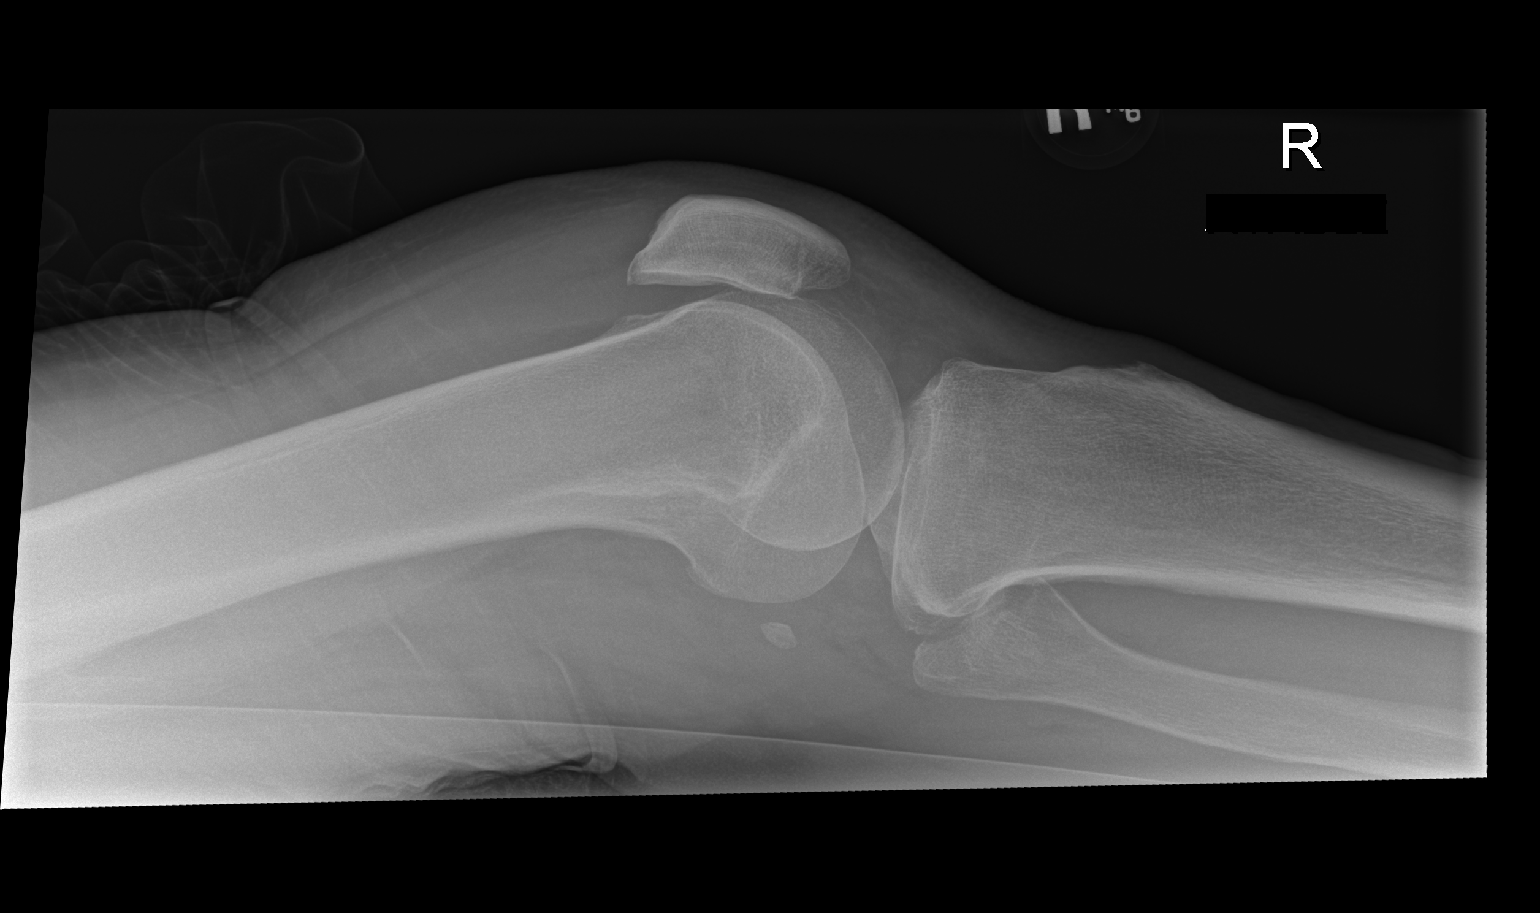

[2 of 2 positions shown; findings below may reference images not displayed]

FINDINGS: There remains soft tissue swelling in the suprapatellar region which
may reflect an underlying joint effusion. There is no evidence of an
acute fracture nor dislocation of the right knee. Very mild
degenerative change is present manifested by beaking of the tibial
spines and tiny spurs from the lateral femoral condyle. The patella
appears normally positioned. There is no evidence of soft tissue
gas.
IMPRESSION: 1. A joint effusion is likely present.
2. There are mild degenerative changes of the right knee.
3. There is no evidence of an acute fracture nor dislocation.

## 2016-01-04 IMAGING — CR DG ANKLE PORT 2V*R*
2 series · 2 of 2 positions shown · non-contrast
Comparison: None.

CLINICAL DATA: Right ankle pain after injury.

EXAM:
PORTABLE RIGHT ANKLE - 2 VIEW

[AP]
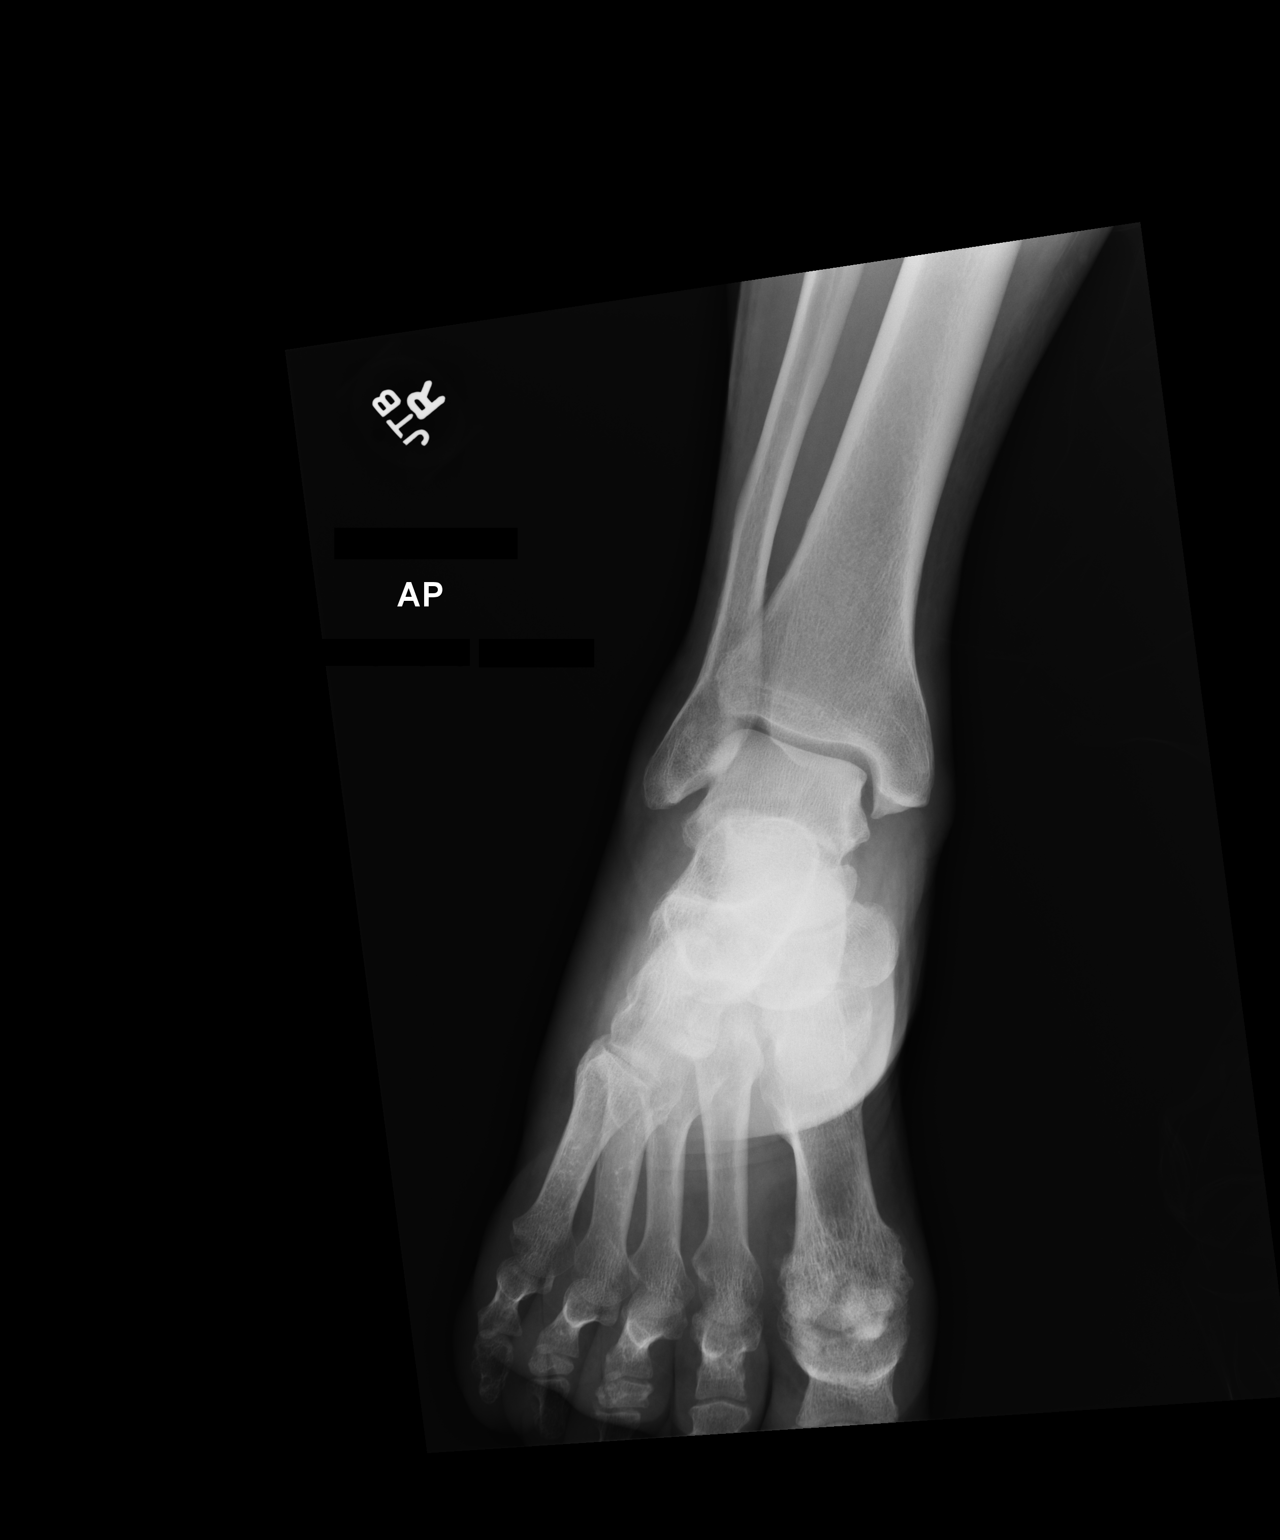

[lateral]
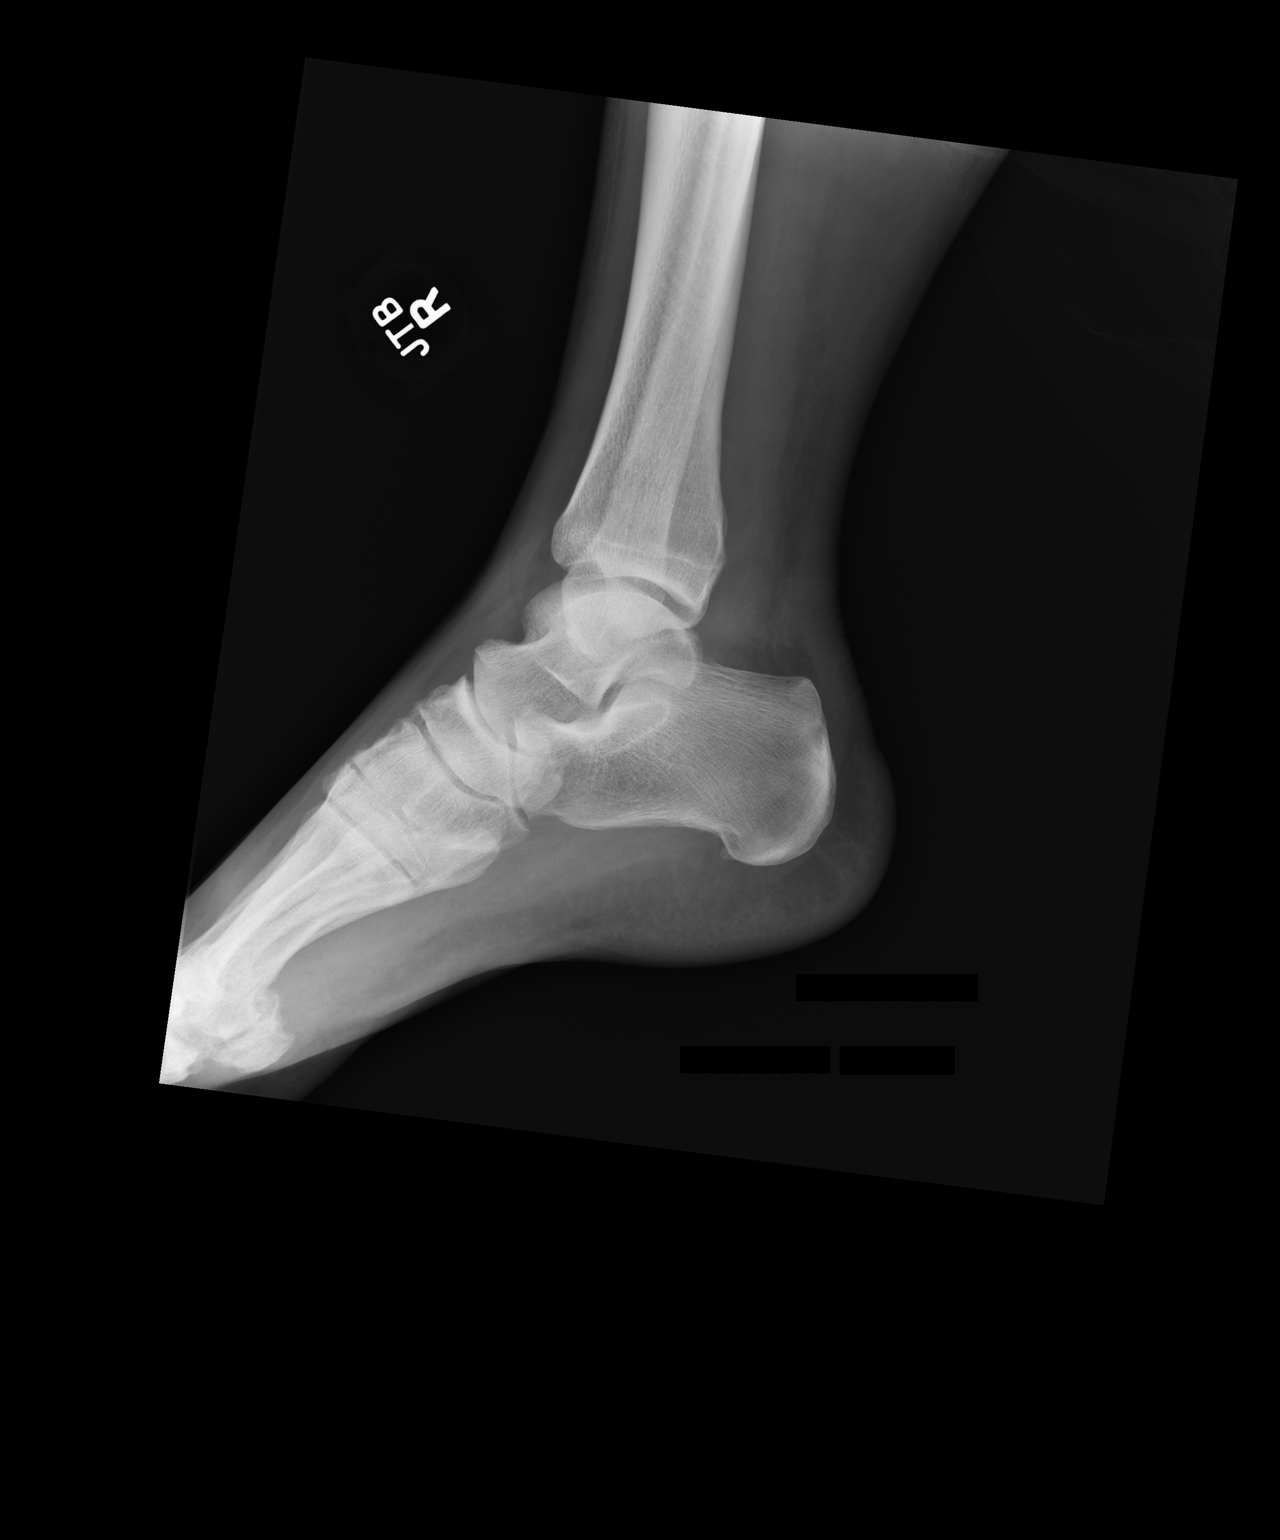

[2 of 2 positions shown; findings below may reference images not displayed]

FINDINGS: Imaged bones, joints and soft tissues appear normal.
IMPRESSION: Negative exam.

## 2016-01-08 DIAGNOSIS — I6789 Other cerebrovascular disease: Secondary | ICD-10-CM | POA: Diagnosis not present

## 2016-01-08 DIAGNOSIS — I10 Essential (primary) hypertension: Secondary | ICD-10-CM | POA: Diagnosis not present

## 2016-01-08 DIAGNOSIS — M545 Low back pain: Secondary | ICD-10-CM | POA: Diagnosis not present

## 2016-01-08 DIAGNOSIS — M1009 Idiopathic gout, multiple sites: Secondary | ICD-10-CM | POA: Diagnosis not present

## 2016-03-10 DIAGNOSIS — M1009 Idiopathic gout, multiple sites: Secondary | ICD-10-CM | POA: Diagnosis not present

## 2016-03-10 DIAGNOSIS — M545 Low back pain: Secondary | ICD-10-CM | POA: Diagnosis not present

## 2016-03-10 DIAGNOSIS — I10 Essential (primary) hypertension: Secondary | ICD-10-CM | POA: Diagnosis not present

## 2016-03-10 DIAGNOSIS — I6789 Other cerebrovascular disease: Secondary | ICD-10-CM | POA: Diagnosis not present

## 2016-03-20 DIAGNOSIS — M545 Low back pain: Secondary | ICD-10-CM | POA: Diagnosis not present

## 2016-03-20 DIAGNOSIS — I10 Essential (primary) hypertension: Secondary | ICD-10-CM | POA: Diagnosis not present

## 2016-03-20 DIAGNOSIS — M1009 Idiopathic gout, multiple sites: Secondary | ICD-10-CM | POA: Diagnosis not present

## 2016-03-20 DIAGNOSIS — I6789 Other cerebrovascular disease: Secondary | ICD-10-CM | POA: Diagnosis not present

## 2016-04-07 DIAGNOSIS — M545 Low back pain: Secondary | ICD-10-CM | POA: Diagnosis not present

## 2016-04-07 DIAGNOSIS — I10 Essential (primary) hypertension: Secondary | ICD-10-CM | POA: Diagnosis not present

## 2016-04-07 DIAGNOSIS — M1009 Idiopathic gout, multiple sites: Secondary | ICD-10-CM | POA: Diagnosis not present

## 2016-04-07 DIAGNOSIS — I6789 Other cerebrovascular disease: Secondary | ICD-10-CM | POA: Diagnosis not present

## 2016-05-12 DIAGNOSIS — M1009 Idiopathic gout, multiple sites: Secondary | ICD-10-CM | POA: Diagnosis not present

## 2016-05-12 DIAGNOSIS — I10 Essential (primary) hypertension: Secondary | ICD-10-CM | POA: Diagnosis not present

## 2016-05-12 DIAGNOSIS — I6789 Other cerebrovascular disease: Secondary | ICD-10-CM | POA: Diagnosis not present

## 2016-05-12 DIAGNOSIS — M545 Low back pain: Secondary | ICD-10-CM | POA: Diagnosis not present

## 2016-06-16 DIAGNOSIS — I10 Essential (primary) hypertension: Secondary | ICD-10-CM | POA: Diagnosis not present

## 2016-06-16 DIAGNOSIS — M545 Low back pain: Secondary | ICD-10-CM | POA: Diagnosis not present

## 2016-06-16 DIAGNOSIS — I6789 Other cerebrovascular disease: Secondary | ICD-10-CM | POA: Diagnosis not present

## 2016-06-16 DIAGNOSIS — M1009 Idiopathic gout, multiple sites: Secondary | ICD-10-CM | POA: Diagnosis not present

## 2016-06-23 DIAGNOSIS — I6789 Other cerebrovascular disease: Secondary | ICD-10-CM | POA: Diagnosis not present

## 2016-06-23 DIAGNOSIS — Z125 Encounter for screening for malignant neoplasm of prostate: Secondary | ICD-10-CM | POA: Diagnosis not present

## 2016-06-23 DIAGNOSIS — M1009 Idiopathic gout, multiple sites: Secondary | ICD-10-CM | POA: Diagnosis not present

## 2016-06-23 DIAGNOSIS — M545 Low back pain: Secondary | ICD-10-CM | POA: Diagnosis not present

## 2016-06-23 DIAGNOSIS — Z1389 Encounter for screening for other disorder: Secondary | ICD-10-CM | POA: Diagnosis not present

## 2016-06-23 DIAGNOSIS — I10 Essential (primary) hypertension: Secondary | ICD-10-CM | POA: Diagnosis not present

## 2016-06-23 DIAGNOSIS — Z0000 Encounter for general adult medical examination without abnormal findings: Secondary | ICD-10-CM | POA: Diagnosis not present

## 2016-07-08 DIAGNOSIS — H35033 Hypertensive retinopathy, bilateral: Secondary | ICD-10-CM | POA: Diagnosis not present

## 2016-07-14 DIAGNOSIS — M1009 Idiopathic gout, multiple sites: Secondary | ICD-10-CM | POA: Diagnosis not present

## 2016-07-14 DIAGNOSIS — I6789 Other cerebrovascular disease: Secondary | ICD-10-CM | POA: Diagnosis not present

## 2016-07-14 DIAGNOSIS — I10 Essential (primary) hypertension: Secondary | ICD-10-CM | POA: Diagnosis not present

## 2016-07-14 DIAGNOSIS — M545 Low back pain: Secondary | ICD-10-CM | POA: Diagnosis not present

## 2016-08-29 DIAGNOSIS — I6789 Other cerebrovascular disease: Secondary | ICD-10-CM | POA: Diagnosis not present

## 2016-08-29 DIAGNOSIS — I10 Essential (primary) hypertension: Secondary | ICD-10-CM | POA: Diagnosis not present

## 2016-08-29 DIAGNOSIS — M545 Low back pain: Secondary | ICD-10-CM | POA: Diagnosis not present

## 2016-08-29 DIAGNOSIS — M1009 Idiopathic gout, multiple sites: Secondary | ICD-10-CM | POA: Diagnosis not present

## 2016-09-19 DIAGNOSIS — I10 Essential (primary) hypertension: Secondary | ICD-10-CM | POA: Diagnosis not present

## 2016-09-19 DIAGNOSIS — M1009 Idiopathic gout, multiple sites: Secondary | ICD-10-CM | POA: Diagnosis not present

## 2016-09-19 DIAGNOSIS — I6789 Other cerebrovascular disease: Secondary | ICD-10-CM | POA: Diagnosis not present

## 2016-09-19 DIAGNOSIS — M545 Low back pain: Secondary | ICD-10-CM | POA: Diagnosis not present

## 2016-09-19 DIAGNOSIS — F411 Generalized anxiety disorder: Secondary | ICD-10-CM | POA: Diagnosis not present

## 2016-09-25 DIAGNOSIS — M1009 Idiopathic gout, multiple sites: Secondary | ICD-10-CM | POA: Diagnosis not present

## 2016-09-25 DIAGNOSIS — I10 Essential (primary) hypertension: Secondary | ICD-10-CM | POA: Diagnosis not present

## 2016-09-25 DIAGNOSIS — I6789 Other cerebrovascular disease: Secondary | ICD-10-CM | POA: Diagnosis not present

## 2016-09-25 DIAGNOSIS — M545 Low back pain: Secondary | ICD-10-CM | POA: Diagnosis not present

## 2016-12-02 DIAGNOSIS — M545 Low back pain: Secondary | ICD-10-CM | POA: Diagnosis not present

## 2016-12-02 DIAGNOSIS — M1009 Idiopathic gout, multiple sites: Secondary | ICD-10-CM | POA: Diagnosis not present

## 2016-12-02 DIAGNOSIS — I10 Essential (primary) hypertension: Secondary | ICD-10-CM | POA: Diagnosis not present

## 2016-12-02 DIAGNOSIS — I6789 Other cerebrovascular disease: Secondary | ICD-10-CM | POA: Diagnosis not present

## 2016-12-17 DIAGNOSIS — M545 Low back pain: Secondary | ICD-10-CM | POA: Diagnosis not present

## 2016-12-17 DIAGNOSIS — M1009 Idiopathic gout, multiple sites: Secondary | ICD-10-CM | POA: Diagnosis not present

## 2016-12-17 DIAGNOSIS — I10 Essential (primary) hypertension: Secondary | ICD-10-CM | POA: Diagnosis not present

## 2016-12-17 DIAGNOSIS — I6789 Other cerebrovascular disease: Secondary | ICD-10-CM | POA: Diagnosis not present

## 2016-12-22 DIAGNOSIS — M1009 Idiopathic gout, multiple sites: Secondary | ICD-10-CM | POA: Diagnosis not present

## 2016-12-22 DIAGNOSIS — I6789 Other cerebrovascular disease: Secondary | ICD-10-CM | POA: Diagnosis not present

## 2016-12-22 DIAGNOSIS — Z131 Encounter for screening for diabetes mellitus: Secondary | ICD-10-CM | POA: Diagnosis not present

## 2016-12-22 DIAGNOSIS — M545 Low back pain: Secondary | ICD-10-CM | POA: Diagnosis not present

## 2016-12-22 DIAGNOSIS — I10 Essential (primary) hypertension: Secondary | ICD-10-CM | POA: Diagnosis not present

## 2017-01-30 DIAGNOSIS — M545 Low back pain: Secondary | ICD-10-CM | POA: Diagnosis not present

## 2017-01-30 DIAGNOSIS — I10 Essential (primary) hypertension: Secondary | ICD-10-CM | POA: Diagnosis not present

## 2017-01-30 DIAGNOSIS — I6789 Other cerebrovascular disease: Secondary | ICD-10-CM | POA: Diagnosis not present

## 2017-01-30 DIAGNOSIS — M1009 Idiopathic gout, multiple sites: Secondary | ICD-10-CM | POA: Diagnosis not present

## 2017-03-02 DIAGNOSIS — I10 Essential (primary) hypertension: Secondary | ICD-10-CM | POA: Diagnosis not present

## 2017-03-02 DIAGNOSIS — M1009 Idiopathic gout, multiple sites: Secondary | ICD-10-CM | POA: Diagnosis not present

## 2017-03-02 DIAGNOSIS — I6789 Other cerebrovascular disease: Secondary | ICD-10-CM | POA: Diagnosis not present

## 2017-03-02 DIAGNOSIS — M545 Low back pain: Secondary | ICD-10-CM | POA: Diagnosis not present

## 2017-03-24 DIAGNOSIS — I6789 Other cerebrovascular disease: Secondary | ICD-10-CM | POA: Diagnosis not present

## 2017-03-24 DIAGNOSIS — I10 Essential (primary) hypertension: Secondary | ICD-10-CM | POA: Diagnosis not present

## 2017-03-24 DIAGNOSIS — M545 Low back pain: Secondary | ICD-10-CM | POA: Diagnosis not present

## 2017-03-24 DIAGNOSIS — F329 Major depressive disorder, single episode, unspecified: Secondary | ICD-10-CM | POA: Diagnosis not present

## 2017-03-24 DIAGNOSIS — M1009 Idiopathic gout, multiple sites: Secondary | ICD-10-CM | POA: Diagnosis not present

## 2017-04-03 DIAGNOSIS — I10 Essential (primary) hypertension: Secondary | ICD-10-CM | POA: Diagnosis not present

## 2017-04-03 DIAGNOSIS — F329 Major depressive disorder, single episode, unspecified: Secondary | ICD-10-CM | POA: Diagnosis not present

## 2017-04-03 DIAGNOSIS — M545 Low back pain: Secondary | ICD-10-CM | POA: Diagnosis not present

## 2017-04-03 DIAGNOSIS — M1009 Idiopathic gout, multiple sites: Secondary | ICD-10-CM | POA: Diagnosis not present

## 2017-04-30 DIAGNOSIS — M1009 Idiopathic gout, multiple sites: Secondary | ICD-10-CM | POA: Diagnosis not present

## 2017-04-30 DIAGNOSIS — F329 Major depressive disorder, single episode, unspecified: Secondary | ICD-10-CM | POA: Diagnosis not present

## 2017-04-30 DIAGNOSIS — I10 Essential (primary) hypertension: Secondary | ICD-10-CM | POA: Diagnosis not present

## 2017-04-30 DIAGNOSIS — M545 Low back pain: Secondary | ICD-10-CM | POA: Diagnosis not present

## 2017-05-28 DIAGNOSIS — I10 Essential (primary) hypertension: Secondary | ICD-10-CM | POA: Diagnosis not present

## 2017-05-28 DIAGNOSIS — M1009 Idiopathic gout, multiple sites: Secondary | ICD-10-CM | POA: Diagnosis not present

## 2017-05-28 DIAGNOSIS — M545 Low back pain: Secondary | ICD-10-CM | POA: Diagnosis not present

## 2017-05-28 DIAGNOSIS — F329 Major depressive disorder, single episode, unspecified: Secondary | ICD-10-CM | POA: Diagnosis not present

## 2017-06-18 DIAGNOSIS — I10 Essential (primary) hypertension: Secondary | ICD-10-CM | POA: Diagnosis not present

## 2017-06-18 DIAGNOSIS — M1009 Idiopathic gout, multiple sites: Secondary | ICD-10-CM | POA: Diagnosis not present

## 2017-06-18 DIAGNOSIS — M545 Low back pain: Secondary | ICD-10-CM | POA: Diagnosis not present

## 2017-06-22 DIAGNOSIS — F329 Major depressive disorder, single episode, unspecified: Secondary | ICD-10-CM | POA: Diagnosis not present

## 2017-06-22 DIAGNOSIS — Z125 Encounter for screening for malignant neoplasm of prostate: Secondary | ICD-10-CM | POA: Diagnosis not present

## 2017-06-22 DIAGNOSIS — M545 Low back pain: Secondary | ICD-10-CM | POA: Diagnosis not present

## 2017-06-22 DIAGNOSIS — I6789 Other cerebrovascular disease: Secondary | ICD-10-CM | POA: Diagnosis not present

## 2017-06-22 DIAGNOSIS — M1009 Idiopathic gout, multiple sites: Secondary | ICD-10-CM | POA: Diagnosis not present

## 2017-06-22 DIAGNOSIS — I10 Essential (primary) hypertension: Secondary | ICD-10-CM | POA: Diagnosis not present

## 2017-07-13 DIAGNOSIS — F329 Major depressive disorder, single episode, unspecified: Secondary | ICD-10-CM | POA: Diagnosis not present

## 2017-07-13 DIAGNOSIS — M1009 Idiopathic gout, multiple sites: Secondary | ICD-10-CM | POA: Diagnosis not present

## 2017-07-13 DIAGNOSIS — I6789 Other cerebrovascular disease: Secondary | ICD-10-CM | POA: Diagnosis not present

## 2017-07-13 DIAGNOSIS — I10 Essential (primary) hypertension: Secondary | ICD-10-CM | POA: Diagnosis not present

## 2017-07-13 DIAGNOSIS — M545 Low back pain: Secondary | ICD-10-CM | POA: Diagnosis not present

## 2017-08-19 DIAGNOSIS — M545 Low back pain: Secondary | ICD-10-CM | POA: Diagnosis not present

## 2017-08-19 DIAGNOSIS — I6789 Other cerebrovascular disease: Secondary | ICD-10-CM | POA: Diagnosis not present

## 2017-08-19 DIAGNOSIS — I10 Essential (primary) hypertension: Secondary | ICD-10-CM | POA: Diagnosis not present

## 2017-08-19 DIAGNOSIS — F329 Major depressive disorder, single episode, unspecified: Secondary | ICD-10-CM | POA: Diagnosis not present

## 2017-08-19 DIAGNOSIS — M1009 Idiopathic gout, multiple sites: Secondary | ICD-10-CM | POA: Diagnosis not present

## 2017-09-08 DIAGNOSIS — M1009 Idiopathic gout, multiple sites: Secondary | ICD-10-CM | POA: Diagnosis not present

## 2017-09-08 DIAGNOSIS — I10 Essential (primary) hypertension: Secondary | ICD-10-CM | POA: Diagnosis not present

## 2017-09-08 DIAGNOSIS — I6789 Other cerebrovascular disease: Secondary | ICD-10-CM | POA: Diagnosis not present

## 2017-09-08 DIAGNOSIS — F329 Major depressive disorder, single episode, unspecified: Secondary | ICD-10-CM | POA: Diagnosis not present

## 2017-09-08 DIAGNOSIS — M545 Low back pain: Secondary | ICD-10-CM | POA: Diagnosis not present

## 2017-09-21 DIAGNOSIS — Z0000 Encounter for general adult medical examination without abnormal findings: Secondary | ICD-10-CM | POA: Diagnosis not present

## 2017-09-21 DIAGNOSIS — I6789 Other cerebrovascular disease: Secondary | ICD-10-CM | POA: Diagnosis not present

## 2017-09-21 DIAGNOSIS — F329 Major depressive disorder, single episode, unspecified: Secondary | ICD-10-CM | POA: Diagnosis not present

## 2017-09-21 DIAGNOSIS — M1009 Idiopathic gout, multiple sites: Secondary | ICD-10-CM | POA: Diagnosis not present

## 2017-09-21 DIAGNOSIS — Z1389 Encounter for screening for other disorder: Secondary | ICD-10-CM | POA: Diagnosis not present

## 2017-09-21 DIAGNOSIS — M545 Low back pain: Secondary | ICD-10-CM | POA: Diagnosis not present

## 2017-09-21 DIAGNOSIS — I10 Essential (primary) hypertension: Secondary | ICD-10-CM | POA: Diagnosis not present

## 2017-09-21 DIAGNOSIS — N181 Chronic kidney disease, stage 1: Secondary | ICD-10-CM | POA: Diagnosis not present

## 2017-10-07 DIAGNOSIS — M85851 Other specified disorders of bone density and structure, right thigh: Secondary | ICD-10-CM | POA: Diagnosis not present

## 2017-10-07 DIAGNOSIS — M85852 Other specified disorders of bone density and structure, left thigh: Secondary | ICD-10-CM | POA: Diagnosis not present

## 2017-10-07 DIAGNOSIS — M810 Age-related osteoporosis without current pathological fracture: Secondary | ICD-10-CM | POA: Diagnosis not present

## 2017-10-23 DIAGNOSIS — I10 Essential (primary) hypertension: Secondary | ICD-10-CM | POA: Diagnosis not present

## 2017-10-23 DIAGNOSIS — M545 Low back pain: Secondary | ICD-10-CM | POA: Diagnosis not present

## 2017-10-23 DIAGNOSIS — I6789 Other cerebrovascular disease: Secondary | ICD-10-CM | POA: Diagnosis not present

## 2017-10-23 DIAGNOSIS — M1009 Idiopathic gout, multiple sites: Secondary | ICD-10-CM | POA: Diagnosis not present

## 2017-11-18 DIAGNOSIS — M545 Low back pain: Secondary | ICD-10-CM | POA: Diagnosis not present

## 2017-11-18 DIAGNOSIS — I10 Essential (primary) hypertension: Secondary | ICD-10-CM | POA: Diagnosis not present

## 2017-11-18 DIAGNOSIS — M1009 Idiopathic gout, multiple sites: Secondary | ICD-10-CM | POA: Diagnosis not present

## 2017-11-18 DIAGNOSIS — I6789 Other cerebrovascular disease: Secondary | ICD-10-CM | POA: Diagnosis not present

## 2017-12-16 DIAGNOSIS — M1009 Idiopathic gout, multiple sites: Secondary | ICD-10-CM | POA: Diagnosis not present

## 2017-12-16 DIAGNOSIS — I6789 Other cerebrovascular disease: Secondary | ICD-10-CM | POA: Diagnosis not present

## 2017-12-16 DIAGNOSIS — I10 Essential (primary) hypertension: Secondary | ICD-10-CM | POA: Diagnosis not present

## 2017-12-16 DIAGNOSIS — M545 Low back pain: Secondary | ICD-10-CM | POA: Diagnosis not present

## 2017-12-21 DIAGNOSIS — N181 Chronic kidney disease, stage 1: Secondary | ICD-10-CM | POA: Diagnosis not present

## 2017-12-21 DIAGNOSIS — I6789 Other cerebrovascular disease: Secondary | ICD-10-CM | POA: Diagnosis not present

## 2017-12-21 DIAGNOSIS — Z1389 Encounter for screening for other disorder: Secondary | ICD-10-CM | POA: Diagnosis not present

## 2017-12-21 DIAGNOSIS — I10 Essential (primary) hypertension: Secondary | ICD-10-CM | POA: Diagnosis not present

## 2017-12-21 DIAGNOSIS — Z0000 Encounter for general adult medical examination without abnormal findings: Secondary | ICD-10-CM | POA: Diagnosis not present

## 2017-12-21 DIAGNOSIS — F329 Major depressive disorder, single episode, unspecified: Secondary | ICD-10-CM | POA: Diagnosis not present

## 2017-12-21 DIAGNOSIS — M545 Low back pain: Secondary | ICD-10-CM | POA: Diagnosis not present

## 2017-12-21 DIAGNOSIS — M1009 Idiopathic gout, multiple sites: Secondary | ICD-10-CM | POA: Diagnosis not present

## 2018-01-18 DIAGNOSIS — I10 Essential (primary) hypertension: Secondary | ICD-10-CM | POA: Diagnosis not present

## 2018-01-18 DIAGNOSIS — N181 Chronic kidney disease, stage 1: Secondary | ICD-10-CM | POA: Diagnosis not present

## 2018-01-18 DIAGNOSIS — M545 Low back pain: Secondary | ICD-10-CM | POA: Diagnosis not present

## 2018-02-16 DIAGNOSIS — M545 Low back pain: Secondary | ICD-10-CM | POA: Diagnosis not present

## 2018-02-16 DIAGNOSIS — I10 Essential (primary) hypertension: Secondary | ICD-10-CM | POA: Diagnosis not present

## 2018-02-16 DIAGNOSIS — N181 Chronic kidney disease, stage 1: Secondary | ICD-10-CM | POA: Diagnosis not present

## 2018-03-17 DIAGNOSIS — J189 Pneumonia, unspecified organism: Secondary | ICD-10-CM | POA: Diagnosis not present

## 2018-03-17 DIAGNOSIS — F329 Major depressive disorder, single episode, unspecified: Secondary | ICD-10-CM | POA: Diagnosis not present

## 2018-03-17 DIAGNOSIS — R0602 Shortness of breath: Secondary | ICD-10-CM | POA: Diagnosis not present

## 2018-03-17 DIAGNOSIS — R0902 Hypoxemia: Secondary | ICD-10-CM | POA: Diagnosis not present

## 2018-03-17 DIAGNOSIS — K449 Diaphragmatic hernia without obstruction or gangrene: Secondary | ICD-10-CM | POA: Diagnosis not present

## 2018-03-17 DIAGNOSIS — J90 Pleural effusion, not elsewhere classified: Secondary | ICD-10-CM | POA: Diagnosis not present

## 2018-03-17 DIAGNOSIS — J9811 Atelectasis: Secondary | ICD-10-CM | POA: Diagnosis not present

## 2018-03-17 DIAGNOSIS — R7881 Bacteremia: Secondary | ICD-10-CM | POA: Diagnosis not present

## 2018-03-17 DIAGNOSIS — R918 Other nonspecific abnormal finding of lung field: Secondary | ICD-10-CM | POA: Diagnosis not present

## 2018-03-17 DIAGNOSIS — I69331 Monoplegia of upper limb following cerebral infarction affecting right dominant side: Secondary | ICD-10-CM | POA: Diagnosis not present

## 2018-03-17 DIAGNOSIS — I10 Essential (primary) hypertension: Secondary | ICD-10-CM | POA: Diagnosis not present

## 2018-03-17 DIAGNOSIS — J1529 Pneumonia due to other staphylococcus: Secondary | ICD-10-CM | POA: Diagnosis not present

## 2018-03-18 DIAGNOSIS — M545 Low back pain: Secondary | ICD-10-CM | POA: Diagnosis present

## 2018-03-18 DIAGNOSIS — J189 Pneumonia, unspecified organism: Secondary | ICD-10-CM | POA: Diagnosis not present

## 2018-03-18 DIAGNOSIS — I10 Essential (primary) hypertension: Secondary | ICD-10-CM | POA: Diagnosis present

## 2018-03-18 DIAGNOSIS — F329 Major depressive disorder, single episode, unspecified: Secondary | ICD-10-CM | POA: Diagnosis present

## 2018-03-18 DIAGNOSIS — I69331 Monoplegia of upper limb following cerebral infarction affecting right dominant side: Secondary | ICD-10-CM | POA: Diagnosis not present

## 2018-03-18 DIAGNOSIS — J1529 Pneumonia due to other staphylococcus: Secondary | ICD-10-CM | POA: Diagnosis present

## 2018-03-18 DIAGNOSIS — Z7982 Long term (current) use of aspirin: Secondary | ICD-10-CM | POA: Diagnosis not present

## 2018-03-18 DIAGNOSIS — R7881 Bacteremia: Secondary | ICD-10-CM | POA: Diagnosis present

## 2018-03-18 DIAGNOSIS — G8929 Other chronic pain: Secondary | ICD-10-CM | POA: Diagnosis present

## 2018-03-18 DIAGNOSIS — K449 Diaphragmatic hernia without obstruction or gangrene: Secondary | ICD-10-CM | POA: Diagnosis present

## 2018-03-18 DIAGNOSIS — M109 Gout, unspecified: Secondary | ICD-10-CM | POA: Diagnosis present

## 2018-03-18 DIAGNOSIS — Z79899 Other long term (current) drug therapy: Secondary | ICD-10-CM | POA: Diagnosis not present

## 2018-03-18 DIAGNOSIS — Z7902 Long term (current) use of antithrombotics/antiplatelets: Secondary | ICD-10-CM | POA: Diagnosis not present

## 2018-03-29 DIAGNOSIS — J1529 Pneumonia due to other staphylococcus: Secondary | ICD-10-CM | POA: Diagnosis not present

## 2018-05-07 DIAGNOSIS — I10 Essential (primary) hypertension: Secondary | ICD-10-CM | POA: Diagnosis not present

## 2018-05-07 DIAGNOSIS — M545 Low back pain: Secondary | ICD-10-CM | POA: Diagnosis not present

## 2018-06-11 DIAGNOSIS — M545 Low back pain: Secondary | ICD-10-CM | POA: Diagnosis not present

## 2018-06-11 DIAGNOSIS — I10 Essential (primary) hypertension: Secondary | ICD-10-CM | POA: Diagnosis not present

## 2018-06-29 DIAGNOSIS — E785 Hyperlipidemia, unspecified: Secondary | ICD-10-CM | POA: Diagnosis not present

## 2018-06-29 DIAGNOSIS — I679 Cerebrovascular disease, unspecified: Secondary | ICD-10-CM | POA: Diagnosis not present

## 2018-06-29 DIAGNOSIS — I10 Essential (primary) hypertension: Secondary | ICD-10-CM | POA: Diagnosis not present

## 2018-06-29 DIAGNOSIS — F411 Generalized anxiety disorder: Secondary | ICD-10-CM | POA: Diagnosis not present

## 2018-06-29 DIAGNOSIS — Z79899 Other long term (current) drug therapy: Secondary | ICD-10-CM | POA: Diagnosis not present

## 2018-06-29 DIAGNOSIS — M545 Low back pain: Secondary | ICD-10-CM | POA: Diagnosis not present

## 2018-06-29 DIAGNOSIS — N182 Chronic kidney disease, stage 2 (mild): Secondary | ICD-10-CM | POA: Diagnosis not present

## 2018-07-12 DIAGNOSIS — I10 Essential (primary) hypertension: Secondary | ICD-10-CM | POA: Diagnosis not present

## 2018-07-12 DIAGNOSIS — E785 Hyperlipidemia, unspecified: Secondary | ICD-10-CM | POA: Diagnosis not present

## 2018-07-12 DIAGNOSIS — M545 Low back pain: Secondary | ICD-10-CM | POA: Diagnosis not present

## 2018-07-12 DIAGNOSIS — N182 Chronic kidney disease, stage 2 (mild): Secondary | ICD-10-CM | POA: Diagnosis not present

## 2018-08-13 DIAGNOSIS — M545 Low back pain: Secondary | ICD-10-CM | POA: Diagnosis not present

## 2018-08-13 DIAGNOSIS — E785 Hyperlipidemia, unspecified: Secondary | ICD-10-CM | POA: Diagnosis not present

## 2018-08-13 DIAGNOSIS — I10 Essential (primary) hypertension: Secondary | ICD-10-CM | POA: Diagnosis not present

## 2018-08-13 DIAGNOSIS — N182 Chronic kidney disease, stage 2 (mild): Secondary | ICD-10-CM | POA: Diagnosis not present

## 2018-09-29 DIAGNOSIS — N182 Chronic kidney disease, stage 2 (mild): Secondary | ICD-10-CM | POA: Diagnosis not present

## 2018-09-29 DIAGNOSIS — E785 Hyperlipidemia, unspecified: Secondary | ICD-10-CM | POA: Diagnosis not present

## 2018-09-29 DIAGNOSIS — I10 Essential (primary) hypertension: Secondary | ICD-10-CM | POA: Diagnosis not present

## 2018-09-29 DIAGNOSIS — M545 Low back pain: Secondary | ICD-10-CM | POA: Diagnosis not present

## 2018-09-30 DIAGNOSIS — I679 Cerebrovascular disease, unspecified: Secondary | ICD-10-CM | POA: Diagnosis not present

## 2018-09-30 DIAGNOSIS — I10 Essential (primary) hypertension: Secondary | ICD-10-CM | POA: Diagnosis not present

## 2018-09-30 DIAGNOSIS — E785 Hyperlipidemia, unspecified: Secondary | ICD-10-CM | POA: Diagnosis not present

## 2018-09-30 DIAGNOSIS — M545 Low back pain: Secondary | ICD-10-CM | POA: Diagnosis not present

## 2018-09-30 DIAGNOSIS — N182 Chronic kidney disease, stage 2 (mild): Secondary | ICD-10-CM | POA: Diagnosis not present

## 2018-10-11 DIAGNOSIS — N182 Chronic kidney disease, stage 2 (mild): Secondary | ICD-10-CM | POA: Diagnosis not present

## 2018-10-11 DIAGNOSIS — E785 Hyperlipidemia, unspecified: Secondary | ICD-10-CM | POA: Diagnosis not present

## 2018-10-11 DIAGNOSIS — M545 Low back pain: Secondary | ICD-10-CM | POA: Diagnosis not present

## 2018-10-11 DIAGNOSIS — I10 Essential (primary) hypertension: Secondary | ICD-10-CM | POA: Diagnosis not present

## 2018-11-09 DIAGNOSIS — N182 Chronic kidney disease, stage 2 (mild): Secondary | ICD-10-CM | POA: Diagnosis not present

## 2018-11-09 DIAGNOSIS — I10 Essential (primary) hypertension: Secondary | ICD-10-CM | POA: Diagnosis not present

## 2018-11-09 DIAGNOSIS — E785 Hyperlipidemia, unspecified: Secondary | ICD-10-CM | POA: Diagnosis not present

## 2018-11-09 DIAGNOSIS — M545 Low back pain: Secondary | ICD-10-CM | POA: Diagnosis not present

## 2018-12-09 DIAGNOSIS — E785 Hyperlipidemia, unspecified: Secondary | ICD-10-CM | POA: Diagnosis not present

## 2018-12-09 DIAGNOSIS — M545 Low back pain: Secondary | ICD-10-CM | POA: Diagnosis not present

## 2018-12-09 DIAGNOSIS — N182 Chronic kidney disease, stage 2 (mild): Secondary | ICD-10-CM | POA: Diagnosis not present

## 2018-12-09 DIAGNOSIS — I10 Essential (primary) hypertension: Secondary | ICD-10-CM | POA: Diagnosis not present

## 2018-12-15 DIAGNOSIS — Z1389 Encounter for screening for other disorder: Secondary | ICD-10-CM | POA: Diagnosis not present

## 2018-12-15 DIAGNOSIS — M545 Low back pain: Secondary | ICD-10-CM | POA: Diagnosis not present

## 2018-12-15 DIAGNOSIS — Z0000 Encounter for general adult medical examination without abnormal findings: Secondary | ICD-10-CM | POA: Diagnosis not present

## 2018-12-15 DIAGNOSIS — I10 Essential (primary) hypertension: Secondary | ICD-10-CM | POA: Diagnosis not present

## 2018-12-15 DIAGNOSIS — I679 Cerebrovascular disease, unspecified: Secondary | ICD-10-CM | POA: Diagnosis not present

## 2018-12-15 DIAGNOSIS — E782 Mixed hyperlipidemia: Secondary | ICD-10-CM | POA: Diagnosis not present

## 2018-12-15 DIAGNOSIS — E785 Hyperlipidemia, unspecified: Secondary | ICD-10-CM | POA: Diagnosis not present

## 2018-12-15 DIAGNOSIS — N182 Chronic kidney disease, stage 2 (mild): Secondary | ICD-10-CM | POA: Diagnosis not present

## 2019-01-18 DIAGNOSIS — E782 Mixed hyperlipidemia: Secondary | ICD-10-CM | POA: Diagnosis not present

## 2019-01-18 DIAGNOSIS — M545 Low back pain: Secondary | ICD-10-CM | POA: Diagnosis not present

## 2019-01-18 DIAGNOSIS — I10 Essential (primary) hypertension: Secondary | ICD-10-CM | POA: Diagnosis not present

## 2019-01-18 DIAGNOSIS — N182 Chronic kidney disease, stage 2 (mild): Secondary | ICD-10-CM | POA: Diagnosis not present

## 2019-02-14 DIAGNOSIS — E782 Mixed hyperlipidemia: Secondary | ICD-10-CM | POA: Diagnosis not present

## 2019-02-14 DIAGNOSIS — N182 Chronic kidney disease, stage 2 (mild): Secondary | ICD-10-CM | POA: Diagnosis not present

## 2019-02-14 DIAGNOSIS — I10 Essential (primary) hypertension: Secondary | ICD-10-CM | POA: Diagnosis not present

## 2019-02-14 DIAGNOSIS — M545 Low back pain: Secondary | ICD-10-CM | POA: Diagnosis not present

## 2019-03-17 DIAGNOSIS — I679 Cerebrovascular disease, unspecified: Secondary | ICD-10-CM | POA: Diagnosis not present

## 2019-03-17 DIAGNOSIS — I10 Essential (primary) hypertension: Secondary | ICD-10-CM | POA: Diagnosis not present

## 2019-03-17 DIAGNOSIS — E7849 Other hyperlipidemia: Secondary | ICD-10-CM | POA: Diagnosis not present

## 2019-03-17 DIAGNOSIS — M545 Low back pain: Secondary | ICD-10-CM | POA: Diagnosis not present

## 2019-03-17 DIAGNOSIS — N182 Chronic kidney disease, stage 2 (mild): Secondary | ICD-10-CM | POA: Diagnosis not present

## 2019-04-05 DIAGNOSIS — M545 Low back pain: Secondary | ICD-10-CM | POA: Diagnosis not present

## 2019-04-05 DIAGNOSIS — I10 Essential (primary) hypertension: Secondary | ICD-10-CM | POA: Diagnosis not present

## 2019-04-05 DIAGNOSIS — N182 Chronic kidney disease, stage 2 (mild): Secondary | ICD-10-CM | POA: Diagnosis not present

## 2019-04-05 DIAGNOSIS — E7849 Other hyperlipidemia: Secondary | ICD-10-CM | POA: Diagnosis not present

## 2019-05-04 DIAGNOSIS — H2513 Age-related nuclear cataract, bilateral: Secondary | ICD-10-CM | POA: Diagnosis not present

## 2019-05-04 DIAGNOSIS — H524 Presbyopia: Secondary | ICD-10-CM | POA: Diagnosis not present

## 2019-05-04 DIAGNOSIS — H5203 Hypermetropia, bilateral: Secondary | ICD-10-CM | POA: Diagnosis not present

## 2019-05-04 DIAGNOSIS — H52223 Regular astigmatism, bilateral: Secondary | ICD-10-CM | POA: Diagnosis not present

## 2019-05-04 DIAGNOSIS — H359 Unspecified retinal disorder: Secondary | ICD-10-CM | POA: Diagnosis not present

## 2019-05-17 DIAGNOSIS — E7849 Other hyperlipidemia: Secondary | ICD-10-CM | POA: Diagnosis not present

## 2019-05-17 DIAGNOSIS — N182 Chronic kidney disease, stage 2 (mild): Secondary | ICD-10-CM | POA: Diagnosis not present

## 2019-05-17 DIAGNOSIS — M545 Low back pain: Secondary | ICD-10-CM | POA: Diagnosis not present

## 2019-05-17 DIAGNOSIS — I10 Essential (primary) hypertension: Secondary | ICD-10-CM | POA: Diagnosis not present

## 2019-06-20 DIAGNOSIS — N182 Chronic kidney disease, stage 2 (mild): Secondary | ICD-10-CM | POA: Diagnosis not present

## 2019-06-20 DIAGNOSIS — I10 Essential (primary) hypertension: Secondary | ICD-10-CM | POA: Diagnosis not present

## 2019-06-20 DIAGNOSIS — M545 Low back pain: Secondary | ICD-10-CM | POA: Diagnosis not present

## 2019-06-20 DIAGNOSIS — I679 Cerebrovascular disease, unspecified: Secondary | ICD-10-CM | POA: Diagnosis not present

## 2019-06-20 DIAGNOSIS — E7849 Other hyperlipidemia: Secondary | ICD-10-CM | POA: Diagnosis not present

## 2019-06-20 DIAGNOSIS — Z0000 Encounter for general adult medical examination without abnormal findings: Secondary | ICD-10-CM | POA: Diagnosis not present

## 2019-07-07 DIAGNOSIS — N182 Chronic kidney disease, stage 2 (mild): Secondary | ICD-10-CM | POA: Diagnosis not present

## 2019-07-07 DIAGNOSIS — I10 Essential (primary) hypertension: Secondary | ICD-10-CM | POA: Diagnosis not present

## 2019-07-07 DIAGNOSIS — E7849 Other hyperlipidemia: Secondary | ICD-10-CM | POA: Diagnosis not present

## 2019-07-07 DIAGNOSIS — M545 Low back pain: Secondary | ICD-10-CM | POA: Diagnosis not present

## 2019-08-03 DIAGNOSIS — H353121 Nonexudative age-related macular degeneration, left eye, early dry stage: Secondary | ICD-10-CM | POA: Diagnosis not present

## 2019-08-03 DIAGNOSIS — H353211 Exudative age-related macular degeneration, right eye, with active choroidal neovascularization: Secondary | ICD-10-CM | POA: Diagnosis not present

## 2019-08-24 DIAGNOSIS — M545 Low back pain: Secondary | ICD-10-CM | POA: Diagnosis not present

## 2019-08-24 DIAGNOSIS — N182 Chronic kidney disease, stage 2 (mild): Secondary | ICD-10-CM | POA: Diagnosis not present

## 2019-08-24 DIAGNOSIS — I10 Essential (primary) hypertension: Secondary | ICD-10-CM | POA: Diagnosis not present

## 2019-08-24 DIAGNOSIS — E7849 Other hyperlipidemia: Secondary | ICD-10-CM | POA: Diagnosis not present

## 2019-09-13 DIAGNOSIS — I10 Essential (primary) hypertension: Secondary | ICD-10-CM | POA: Diagnosis not present

## 2019-09-13 DIAGNOSIS — Z0000 Encounter for general adult medical examination without abnormal findings: Secondary | ICD-10-CM | POA: Diagnosis not present

## 2019-09-13 DIAGNOSIS — Z8673 Personal history of transient ischemic attack (TIA), and cerebral infarction without residual deficits: Secondary | ICD-10-CM | POA: Diagnosis not present

## 2019-09-13 DIAGNOSIS — N182 Chronic kidney disease, stage 2 (mild): Secondary | ICD-10-CM | POA: Diagnosis not present

## 2019-09-13 DIAGNOSIS — E7849 Other hyperlipidemia: Secondary | ICD-10-CM | POA: Diagnosis not present

## 2019-09-13 DIAGNOSIS — M545 Low back pain: Secondary | ICD-10-CM | POA: Diagnosis not present

## 2019-09-15 DIAGNOSIS — E7849 Other hyperlipidemia: Secondary | ICD-10-CM | POA: Diagnosis not present

## 2019-09-15 DIAGNOSIS — I10 Essential (primary) hypertension: Secondary | ICD-10-CM | POA: Diagnosis not present

## 2019-09-15 DIAGNOSIS — M545 Low back pain: Secondary | ICD-10-CM | POA: Diagnosis not present

## 2019-09-15 DIAGNOSIS — N182 Chronic kidney disease, stage 2 (mild): Secondary | ICD-10-CM | POA: Diagnosis not present

## 2019-10-04 DIAGNOSIS — I6930 Unspecified sequelae of cerebral infarction: Secondary | ICD-10-CM | POA: Diagnosis not present

## 2019-10-06 DIAGNOSIS — M85852 Other specified disorders of bone density and structure, left thigh: Secondary | ICD-10-CM | POA: Diagnosis not present

## 2019-10-06 DIAGNOSIS — Z0389 Encounter for observation for other suspected diseases and conditions ruled out: Secondary | ICD-10-CM | POA: Diagnosis not present

## 2019-10-06 DIAGNOSIS — M810 Age-related osteoporosis without current pathological fracture: Secondary | ICD-10-CM | POA: Diagnosis not present

## 2019-10-06 DIAGNOSIS — Z13820 Encounter for screening for osteoporosis: Secondary | ICD-10-CM | POA: Diagnosis not present

## 2019-10-14 DIAGNOSIS — I10 Essential (primary) hypertension: Secondary | ICD-10-CM | POA: Diagnosis not present

## 2019-10-14 DIAGNOSIS — M545 Low back pain: Secondary | ICD-10-CM | POA: Diagnosis not present

## 2019-11-09 DIAGNOSIS — I6930 Unspecified sequelae of cerebral infarction: Secondary | ICD-10-CM | POA: Diagnosis not present

## 2019-11-29 DIAGNOSIS — M545 Low back pain: Secondary | ICD-10-CM | POA: Diagnosis not present

## 2019-11-29 DIAGNOSIS — I10 Essential (primary) hypertension: Secondary | ICD-10-CM | POA: Diagnosis not present

## 2019-12-10 DIAGNOSIS — I6930 Unspecified sequelae of cerebral infarction: Secondary | ICD-10-CM | POA: Diagnosis not present

## 2019-12-20 DIAGNOSIS — I10 Essential (primary) hypertension: Secondary | ICD-10-CM | POA: Diagnosis not present

## 2019-12-20 DIAGNOSIS — E7849 Other hyperlipidemia: Secondary | ICD-10-CM | POA: Diagnosis not present

## 2019-12-20 DIAGNOSIS — I6930 Unspecified sequelae of cerebral infarction: Secondary | ICD-10-CM | POA: Diagnosis not present

## 2019-12-20 DIAGNOSIS — N182 Chronic kidney disease, stage 2 (mild): Secondary | ICD-10-CM | POA: Diagnosis not present

## 2019-12-20 DIAGNOSIS — Z8673 Personal history of transient ischemic attack (TIA), and cerebral infarction without residual deficits: Secondary | ICD-10-CM | POA: Diagnosis not present

## 2019-12-20 DIAGNOSIS — K219 Gastro-esophageal reflux disease without esophagitis: Secondary | ICD-10-CM | POA: Diagnosis not present

## 2019-12-23 DIAGNOSIS — E7849 Other hyperlipidemia: Secondary | ICD-10-CM | POA: Diagnosis not present

## 2019-12-23 DIAGNOSIS — N182 Chronic kidney disease, stage 2 (mild): Secondary | ICD-10-CM | POA: Diagnosis not present

## 2019-12-23 DIAGNOSIS — K219 Gastro-esophageal reflux disease without esophagitis: Secondary | ICD-10-CM | POA: Diagnosis not present

## 2019-12-23 DIAGNOSIS — I10 Essential (primary) hypertension: Secondary | ICD-10-CM | POA: Diagnosis not present

## 2020-01-10 DIAGNOSIS — I6930 Unspecified sequelae of cerebral infarction: Secondary | ICD-10-CM | POA: Diagnosis not present

## 2020-01-18 DIAGNOSIS — N182 Chronic kidney disease, stage 2 (mild): Secondary | ICD-10-CM | POA: Diagnosis not present

## 2020-01-18 DIAGNOSIS — K219 Gastro-esophageal reflux disease without esophagitis: Secondary | ICD-10-CM | POA: Diagnosis not present

## 2020-01-18 DIAGNOSIS — E7849 Other hyperlipidemia: Secondary | ICD-10-CM | POA: Diagnosis not present

## 2020-01-18 DIAGNOSIS — I10 Essential (primary) hypertension: Secondary | ICD-10-CM | POA: Diagnosis not present

## 2020-02-08 DIAGNOSIS — N182 Chronic kidney disease, stage 2 (mild): Secondary | ICD-10-CM | POA: Diagnosis not present

## 2020-02-08 DIAGNOSIS — K219 Gastro-esophageal reflux disease without esophagitis: Secondary | ICD-10-CM | POA: Diagnosis not present

## 2020-02-08 DIAGNOSIS — E7849 Other hyperlipidemia: Secondary | ICD-10-CM | POA: Diagnosis not present

## 2020-02-08 DIAGNOSIS — I10 Essential (primary) hypertension: Secondary | ICD-10-CM | POA: Diagnosis not present

## 2020-02-09 DIAGNOSIS — I6930 Unspecified sequelae of cerebral infarction: Secondary | ICD-10-CM | POA: Diagnosis not present

## 2020-03-11 DIAGNOSIS — I6930 Unspecified sequelae of cerebral infarction: Secondary | ICD-10-CM | POA: Diagnosis not present

## 2020-03-21 DIAGNOSIS — N182 Chronic kidney disease, stage 2 (mild): Secondary | ICD-10-CM | POA: Diagnosis not present

## 2020-03-21 DIAGNOSIS — I10 Essential (primary) hypertension: Secondary | ICD-10-CM | POA: Diagnosis not present

## 2020-03-21 DIAGNOSIS — K219 Gastro-esophageal reflux disease without esophagitis: Secondary | ICD-10-CM | POA: Diagnosis not present

## 2020-03-21 DIAGNOSIS — Z0000 Encounter for general adult medical examination without abnormal findings: Secondary | ICD-10-CM | POA: Diagnosis not present

## 2020-03-21 DIAGNOSIS — E7849 Other hyperlipidemia: Secondary | ICD-10-CM | POA: Diagnosis not present

## 2020-03-21 DIAGNOSIS — Z8673 Personal history of transient ischemic attack (TIA), and cerebral infarction without residual deficits: Secondary | ICD-10-CM | POA: Diagnosis not present

## 2020-04-03 DIAGNOSIS — N182 Chronic kidney disease, stage 2 (mild): Secondary | ICD-10-CM | POA: Diagnosis not present

## 2020-04-03 DIAGNOSIS — E7849 Other hyperlipidemia: Secondary | ICD-10-CM | POA: Diagnosis not present

## 2020-04-03 DIAGNOSIS — K219 Gastro-esophageal reflux disease without esophagitis: Secondary | ICD-10-CM | POA: Diagnosis not present

## 2020-04-03 DIAGNOSIS — I10 Essential (primary) hypertension: Secondary | ICD-10-CM | POA: Diagnosis not present

## 2020-04-10 DIAGNOSIS — I6930 Unspecified sequelae of cerebral infarction: Secondary | ICD-10-CM | POA: Diagnosis not present

## 2020-04-19 DIAGNOSIS — R52 Pain, unspecified: Secondary | ICD-10-CM | POA: Diagnosis not present

## 2020-04-19 DIAGNOSIS — G9389 Other specified disorders of brain: Secondary | ICD-10-CM | POA: Diagnosis not present

## 2020-04-19 DIAGNOSIS — R079 Chest pain, unspecified: Secondary | ICD-10-CM | POA: Diagnosis not present

## 2020-04-19 DIAGNOSIS — I10 Essential (primary) hypertension: Secondary | ICD-10-CM | POA: Diagnosis not present

## 2020-04-19 DIAGNOSIS — I619 Nontraumatic intracerebral hemorrhage, unspecified: Secondary | ICD-10-CM | POA: Diagnosis not present

## 2020-04-19 DIAGNOSIS — R0902 Hypoxemia: Secondary | ICD-10-CM | POA: Diagnosis not present

## 2020-04-19 DIAGNOSIS — W1839XA Other fall on same level, initial encounter: Secondary | ICD-10-CM | POA: Diagnosis not present

## 2020-04-19 DIAGNOSIS — Z043 Encounter for examination and observation following other accident: Secondary | ICD-10-CM | POA: Diagnosis not present

## 2020-04-19 DIAGNOSIS — I6789 Other cerebrovascular disease: Secondary | ICD-10-CM | POA: Diagnosis not present

## 2020-04-19 DIAGNOSIS — I69351 Hemiplegia and hemiparesis following cerebral infarction affecting right dominant side: Secondary | ICD-10-CM | POA: Diagnosis not present

## 2020-04-19 DIAGNOSIS — I69398 Other sequelae of cerebral infarction: Secondary | ICD-10-CM | POA: Diagnosis not present

## 2020-04-19 DIAGNOSIS — S20211A Contusion of right front wall of thorax, initial encounter: Secondary | ICD-10-CM | POA: Diagnosis not present

## 2020-04-19 DIAGNOSIS — I639 Cerebral infarction, unspecified: Secondary | ICD-10-CM | POA: Diagnosis not present

## 2020-04-19 DIAGNOSIS — G319 Degenerative disease of nervous system, unspecified: Secondary | ICD-10-CM | POA: Diagnosis not present

## 2020-04-30 DIAGNOSIS — E7849 Other hyperlipidemia: Secondary | ICD-10-CM | POA: Diagnosis not present

## 2020-04-30 DIAGNOSIS — K219 Gastro-esophageal reflux disease without esophagitis: Secondary | ICD-10-CM | POA: Diagnosis not present

## 2020-04-30 DIAGNOSIS — N182 Chronic kidney disease, stage 2 (mild): Secondary | ICD-10-CM | POA: Diagnosis not present

## 2020-04-30 DIAGNOSIS — I10 Essential (primary) hypertension: Secondary | ICD-10-CM | POA: Diagnosis not present

## 2020-05-11 DIAGNOSIS — I6930 Unspecified sequelae of cerebral infarction: Secondary | ICD-10-CM | POA: Diagnosis not present

## 2020-06-11 DIAGNOSIS — I6930 Unspecified sequelae of cerebral infarction: Secondary | ICD-10-CM | POA: Diagnosis not present

## 2020-06-21 DIAGNOSIS — E7849 Other hyperlipidemia: Secondary | ICD-10-CM | POA: Diagnosis not present

## 2020-06-21 DIAGNOSIS — K219 Gastro-esophageal reflux disease without esophagitis: Secondary | ICD-10-CM | POA: Diagnosis not present

## 2020-06-21 DIAGNOSIS — N182 Chronic kidney disease, stage 2 (mild): Secondary | ICD-10-CM | POA: Diagnosis not present

## 2020-06-21 DIAGNOSIS — I10 Essential (primary) hypertension: Secondary | ICD-10-CM | POA: Diagnosis not present

## 2020-06-21 DIAGNOSIS — Z8673 Personal history of transient ischemic attack (TIA), and cerebral infarction without residual deficits: Secondary | ICD-10-CM | POA: Diagnosis not present

## 2020-06-21 DIAGNOSIS — Z0000 Encounter for general adult medical examination without abnormal findings: Secondary | ICD-10-CM | POA: Diagnosis not present

## 2020-07-09 DIAGNOSIS — I6930 Unspecified sequelae of cerebral infarction: Secondary | ICD-10-CM | POA: Diagnosis not present

## 2020-08-09 DIAGNOSIS — I6930 Unspecified sequelae of cerebral infarction: Secondary | ICD-10-CM | POA: Diagnosis not present

## 2020-08-16 DIAGNOSIS — M25562 Pain in left knee: Secondary | ICD-10-CM | POA: Diagnosis not present

## 2020-08-16 DIAGNOSIS — M1712 Unilateral primary osteoarthritis, left knee: Secondary | ICD-10-CM | POA: Diagnosis not present

## 2020-08-16 DIAGNOSIS — I69351 Hemiplegia and hemiparesis following cerebral infarction affecting right dominant side: Secondary | ICD-10-CM | POA: Diagnosis not present

## 2020-08-16 DIAGNOSIS — Z7289 Other problems related to lifestyle: Secondary | ICD-10-CM | POA: Diagnosis not present

## 2020-08-16 DIAGNOSIS — Z743 Need for continuous supervision: Secondary | ICD-10-CM | POA: Diagnosis not present

## 2020-08-16 DIAGNOSIS — I10 Essential (primary) hypertension: Secondary | ICD-10-CM | POA: Diagnosis not present

## 2020-08-16 DIAGNOSIS — X501XXA Overexertion from prolonged static or awkward postures, initial encounter: Secondary | ICD-10-CM | POA: Diagnosis not present

## 2020-08-16 DIAGNOSIS — R609 Edema, unspecified: Secondary | ICD-10-CM | POA: Diagnosis not present

## 2020-08-16 DIAGNOSIS — R600 Localized edema: Secondary | ICD-10-CM | POA: Diagnosis not present

## 2020-08-17 DIAGNOSIS — R531 Weakness: Secondary | ICD-10-CM | POA: Diagnosis not present

## 2020-08-17 DIAGNOSIS — R5381 Other malaise: Secondary | ICD-10-CM | POA: Diagnosis not present

## 2020-09-08 DIAGNOSIS — I6930 Unspecified sequelae of cerebral infarction: Secondary | ICD-10-CM | POA: Diagnosis not present

## 2020-09-18 DIAGNOSIS — E7849 Other hyperlipidemia: Secondary | ICD-10-CM | POA: Diagnosis not present

## 2020-09-18 DIAGNOSIS — Z8673 Personal history of transient ischemic attack (TIA), and cerebral infarction without residual deficits: Secondary | ICD-10-CM | POA: Diagnosis not present

## 2020-09-18 DIAGNOSIS — I10 Essential (primary) hypertension: Secondary | ICD-10-CM | POA: Diagnosis not present

## 2020-09-18 DIAGNOSIS — K219 Gastro-esophageal reflux disease without esophagitis: Secondary | ICD-10-CM | POA: Diagnosis not present

## 2020-09-18 DIAGNOSIS — N182 Chronic kidney disease, stage 2 (mild): Secondary | ICD-10-CM | POA: Diagnosis not present

## 2020-09-18 DIAGNOSIS — Z0000 Encounter for general adult medical examination without abnormal findings: Secondary | ICD-10-CM | POA: Diagnosis not present

## 2020-10-09 DIAGNOSIS — I6930 Unspecified sequelae of cerebral infarction: Secondary | ICD-10-CM | POA: Diagnosis not present

## 2020-11-08 DIAGNOSIS — W19XXXA Unspecified fall, initial encounter: Secondary | ICD-10-CM | POA: Diagnosis not present

## 2020-11-08 DIAGNOSIS — R52 Pain, unspecified: Secondary | ICD-10-CM | POA: Diagnosis not present

## 2020-11-08 DIAGNOSIS — R69 Illness, unspecified: Secondary | ICD-10-CM | POA: Diagnosis not present

## 2020-11-29 DIAGNOSIS — W19XXXA Unspecified fall, initial encounter: Secondary | ICD-10-CM | POA: Diagnosis not present

## 2020-11-29 DIAGNOSIS — R5381 Other malaise: Secondary | ICD-10-CM | POA: Diagnosis not present

## 2020-12-13 DIAGNOSIS — W19XXXA Unspecified fall, initial encounter: Secondary | ICD-10-CM | POA: Diagnosis not present

## 2020-12-13 DIAGNOSIS — R0902 Hypoxemia: Secondary | ICD-10-CM | POA: Diagnosis not present

## 2020-12-24 DIAGNOSIS — Z0000 Encounter for general adult medical examination without abnormal findings: Secondary | ICD-10-CM | POA: Diagnosis not present

## 2020-12-24 DIAGNOSIS — K219 Gastro-esophageal reflux disease without esophagitis: Secondary | ICD-10-CM | POA: Diagnosis not present

## 2020-12-24 DIAGNOSIS — I10 Essential (primary) hypertension: Secondary | ICD-10-CM | POA: Diagnosis not present

## 2020-12-24 DIAGNOSIS — N182 Chronic kidney disease, stage 2 (mild): Secondary | ICD-10-CM | POA: Diagnosis not present

## 2020-12-24 DIAGNOSIS — Z8673 Personal history of transient ischemic attack (TIA), and cerebral infarction without residual deficits: Secondary | ICD-10-CM | POA: Diagnosis not present

## 2020-12-24 DIAGNOSIS — E7849 Other hyperlipidemia: Secondary | ICD-10-CM | POA: Diagnosis not present

## 2021-01-02 DIAGNOSIS — K219 Gastro-esophageal reflux disease without esophagitis: Secondary | ICD-10-CM | POA: Diagnosis not present

## 2021-01-02 DIAGNOSIS — E7849 Other hyperlipidemia: Secondary | ICD-10-CM | POA: Diagnosis not present

## 2021-01-02 DIAGNOSIS — N182 Chronic kidney disease, stage 2 (mild): Secondary | ICD-10-CM | POA: Diagnosis not present

## 2021-01-02 DIAGNOSIS — I10 Essential (primary) hypertension: Secondary | ICD-10-CM | POA: Diagnosis not present

## 2021-01-05 DIAGNOSIS — R0902 Hypoxemia: Secondary | ICD-10-CM | POA: Diagnosis not present

## 2021-01-05 DIAGNOSIS — W19XXXA Unspecified fall, initial encounter: Secondary | ICD-10-CM | POA: Diagnosis not present

## 2021-02-12 DIAGNOSIS — T1490XA Injury, unspecified, initial encounter: Secondary | ICD-10-CM | POA: Diagnosis not present

## 2021-02-12 DIAGNOSIS — W19XXXA Unspecified fall, initial encounter: Secondary | ICD-10-CM | POA: Diagnosis not present

## 2021-02-19 DIAGNOSIS — E7849 Other hyperlipidemia: Secondary | ICD-10-CM | POA: Diagnosis not present

## 2021-02-19 DIAGNOSIS — Z8673 Personal history of transient ischemic attack (TIA), and cerebral infarction without residual deficits: Secondary | ICD-10-CM | POA: Diagnosis not present

## 2021-02-19 DIAGNOSIS — I10 Essential (primary) hypertension: Secondary | ICD-10-CM | POA: Diagnosis not present

## 2021-02-19 DIAGNOSIS — N182 Chronic kidney disease, stage 2 (mild): Secondary | ICD-10-CM | POA: Diagnosis not present

## 2021-02-19 DIAGNOSIS — K219 Gastro-esophageal reflux disease without esophagitis: Secondary | ICD-10-CM | POA: Diagnosis not present

## 2021-03-01 DIAGNOSIS — I10 Essential (primary) hypertension: Secondary | ICD-10-CM | POA: Diagnosis not present

## 2021-03-01 DIAGNOSIS — E7849 Other hyperlipidemia: Secondary | ICD-10-CM | POA: Diagnosis not present

## 2021-03-01 DIAGNOSIS — R5381 Other malaise: Secondary | ICD-10-CM | POA: Diagnosis not present

## 2021-03-01 DIAGNOSIS — T148XXA Other injury of unspecified body region, initial encounter: Secondary | ICD-10-CM | POA: Diagnosis not present

## 2021-03-04 DIAGNOSIS — Z8673 Personal history of transient ischemic attack (TIA), and cerebral infarction without residual deficits: Secondary | ICD-10-CM | POA: Diagnosis not present

## 2021-03-04 DIAGNOSIS — I10 Essential (primary) hypertension: Secondary | ICD-10-CM | POA: Diagnosis not present

## 2021-03-19 DIAGNOSIS — E7849 Other hyperlipidemia: Secondary | ICD-10-CM | POA: Diagnosis not present

## 2021-03-19 DIAGNOSIS — I10 Essential (primary) hypertension: Secondary | ICD-10-CM | POA: Diagnosis not present

## 2021-03-19 DIAGNOSIS — Z8673 Personal history of transient ischemic attack (TIA), and cerebral infarction without residual deficits: Secondary | ICD-10-CM | POA: Diagnosis not present

## 2021-04-02 DIAGNOSIS — R5381 Other malaise: Secondary | ICD-10-CM | POA: Diagnosis not present

## 2021-04-02 DIAGNOSIS — R531 Weakness: Secondary | ICD-10-CM | POA: Diagnosis not present

## 2021-04-10 ENCOUNTER — Other Ambulatory Visit: Payer: Self-pay

## 2021-04-10 ENCOUNTER — Encounter (HOSPITAL_COMMUNITY): Payer: Self-pay

## 2021-04-10 ENCOUNTER — Ambulatory Visit (HOSPITAL_COMMUNITY): Payer: Medicare Other | Attending: Internal Medicine

## 2021-04-10 DIAGNOSIS — R29898 Other symptoms and signs involving the musculoskeletal system: Secondary | ICD-10-CM | POA: Insufficient documentation

## 2021-04-10 DIAGNOSIS — M25611 Stiffness of right shoulder, not elsewhere classified: Secondary | ICD-10-CM | POA: Insufficient documentation

## 2021-04-10 NOTE — Therapy (Signed)
[facility] [address] [place], [place], [address] Phone: [phone]   Fax:  [phone]  Occupational Therapy Wheelchair Evaluation  Patient Details  Name: [name] MRN: [id number] Date of Birth: July 23, 1950 Referring Provider (OT): Lia Hopping, MD   Encounter Date: 04/10/2021   OT End of Session - 04/10/21 1605     Visit Number 1    Number of Visits 1    Authorization Type [employer] Medicare    OT Start Time 1400    OT Stop Time 1500    OT Time Calculation (min) 60 min    Activity Tolerance Patient tolerated treatment well    Behavior During Therapy [facility] for tasks assessed/performed             Past Medical History:  Diagnosis Date   Hypertension     History reviewed. No pertinent surgical history.  There were no vitals filed for this visit.      [facility] OT Assessment - 04/10/21 1604       Assessment   Medical Diagnosis Wheelchair evaluation    Referring Provider (OT) Lia Hopping, MD      Precautions   Precautions Fall;Other (comment)    Precaution Comments right upper and lower hemiplegia                Wheelchair evaluation was completed at today's visit.  Josh Cadle, ATP from [employer] will be doing a home visit for further evaluation of appropriate equipment.  Full letter of medical necessity will be available once final recommendations made.  Thank you for the referral of this patient. [name], OTR/L,CBIS  [phone]       Plan - 04/10/21 1605     OT Occupational Profile and History Detailed Assessment- Review of Records and additional review of physical, cognitive, psychosocial history related to current functional performance    Occupational performance deficits (Please refer to evaluation for details): ADL's;Leisure    Body Structure / Function / Physical Skills Balance;UE functional use;ADL;Pain;Edema;Mobility;Tone;ROM;Gait    Rehab Potential Excellent    Clinical  Decision Making Limited treatment options, no task modification necessary    Comorbidities Affecting Occupational Performance: Presence of comorbidities impacting occupational performance    Comorbidities impacting occupational performance description: see medical chart    OT Frequency One time visit    OT Treatment/Interventions Other (comment)   wheelchair evaluation only   Consulted and Agree with Plan of Care Patient;Family member/caregiver    Family Member Consulted step daughter             Patient will benefit from skilled therapeutic intervention in order to improve the following deficits and impairments:   Body Structure / Function / Physical Skills: Balance, UE functional use, ADL, Pain, Edema, Mobility, Tone, ROM, Gait       Visit Diagnosis: Other symptoms and signs involving the musculoskeletal system  Stiffness of right shoulder, not elsewhere classified    Problem List Patient Active Problem List   Diagnosis Date Noted   probable undiagnosed OSA on CPAP 05/06/2013   Other and unspecified hyperlipidemia 05/06/2013   Hypokalemia 05/06/2013   Chews tobacco 05/06/2013   Dysphagia, secondary to stroke 05/06/2013   Malignant hypertension 05/02/2013   Acute respiratory distress 05/02/2013   Tobacco abuse 05/02/2013   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

## 2021-04-18 DIAGNOSIS — R5381 Other malaise: Secondary | ICD-10-CM | POA: Diagnosis not present

## 2021-04-18 DIAGNOSIS — R69 Illness, unspecified: Secondary | ICD-10-CM | POA: Diagnosis not present

## 2021-05-03 DIAGNOSIS — I10 Essential (primary) hypertension: Secondary | ICD-10-CM | POA: Diagnosis not present

## 2021-05-03 DIAGNOSIS — E7849 Other hyperlipidemia: Secondary | ICD-10-CM | POA: Diagnosis not present

## 2021-06-19 DIAGNOSIS — E7849 Other hyperlipidemia: Secondary | ICD-10-CM | POA: Diagnosis not present

## 2021-06-19 DIAGNOSIS — Z8673 Personal history of transient ischemic attack (TIA), and cerebral infarction without residual deficits: Secondary | ICD-10-CM | POA: Diagnosis not present

## 2021-06-19 DIAGNOSIS — I10 Essential (primary) hypertension: Secondary | ICD-10-CM | POA: Diagnosis not present

## 2021-06-19 DIAGNOSIS — Z0000 Encounter for general adult medical examination without abnormal findings: Secondary | ICD-10-CM | POA: Diagnosis not present

## 2021-07-03 DIAGNOSIS — T1490XA Injury, unspecified, initial encounter: Secondary | ICD-10-CM | POA: Diagnosis not present

## 2021-07-03 DIAGNOSIS — R5381 Other malaise: Secondary | ICD-10-CM | POA: Diagnosis not present

## 2021-09-18 DIAGNOSIS — I10 Essential (primary) hypertension: Secondary | ICD-10-CM | POA: Diagnosis not present

## 2021-09-18 DIAGNOSIS — Z8673 Personal history of transient ischemic attack (TIA), and cerebral infarction without residual deficits: Secondary | ICD-10-CM | POA: Diagnosis not present

## 2021-09-18 DIAGNOSIS — Z0000 Encounter for general adult medical examination without abnormal findings: Secondary | ICD-10-CM | POA: Diagnosis not present

## 2021-09-18 DIAGNOSIS — E7849 Other hyperlipidemia: Secondary | ICD-10-CM | POA: Diagnosis not present

## 2021-09-18 DIAGNOSIS — K219 Gastro-esophageal reflux disease without esophagitis: Secondary | ICD-10-CM | POA: Diagnosis not present

## 2021-12-19 DIAGNOSIS — K219 Gastro-esophageal reflux disease without esophagitis: Secondary | ICD-10-CM | POA: Diagnosis not present

## 2021-12-19 DIAGNOSIS — Z8673 Personal history of transient ischemic attack (TIA), and cerebral infarction without residual deficits: Secondary | ICD-10-CM | POA: Diagnosis not present

## 2021-12-19 DIAGNOSIS — I10 Essential (primary) hypertension: Secondary | ICD-10-CM | POA: Diagnosis not present

## 2021-12-19 DIAGNOSIS — E7849 Other hyperlipidemia: Secondary | ICD-10-CM | POA: Diagnosis not present

## 2021-12-23 DIAGNOSIS — H5203 Hypermetropia, bilateral: Secondary | ICD-10-CM | POA: Diagnosis not present

## 2021-12-23 DIAGNOSIS — H524 Presbyopia: Secondary | ICD-10-CM | POA: Diagnosis not present

## 2021-12-23 DIAGNOSIS — H353211 Exudative age-related macular degeneration, right eye, with active choroidal neovascularization: Secondary | ICD-10-CM | POA: Diagnosis not present

## 2021-12-23 DIAGNOSIS — H2513 Age-related nuclear cataract, bilateral: Secondary | ICD-10-CM | POA: Diagnosis not present

## 2022-01-20 DIAGNOSIS — M9907 Segmental and somatic dysfunction of upper extremity: Secondary | ICD-10-CM | POA: Diagnosis not present

## 2022-01-20 DIAGNOSIS — Z8673 Personal history of transient ischemic attack (TIA), and cerebral infarction without residual deficits: Secondary | ICD-10-CM | POA: Diagnosis not present

## 2022-01-20 DIAGNOSIS — G8321 Monoplegia of upper limb affecting right dominant side: Secondary | ICD-10-CM | POA: Diagnosis not present

## 2022-03-19 DIAGNOSIS — I10 Essential (primary) hypertension: Secondary | ICD-10-CM | POA: Diagnosis not present

## 2022-03-19 DIAGNOSIS — K219 Gastro-esophageal reflux disease without esophagitis: Secondary | ICD-10-CM | POA: Diagnosis not present

## 2022-03-19 DIAGNOSIS — E7849 Other hyperlipidemia: Secondary | ICD-10-CM | POA: Diagnosis not present

## 2022-03-19 DIAGNOSIS — Z8673 Personal history of transient ischemic attack (TIA), and cerebral infarction without residual deficits: Secondary | ICD-10-CM | POA: Diagnosis not present

## 2022-04-14 DIAGNOSIS — Z880 Allergy status to penicillin: Secondary | ICD-10-CM | POA: Diagnosis not present

## 2022-04-14 DIAGNOSIS — R051 Acute cough: Secondary | ICD-10-CM | POA: Diagnosis not present

## 2022-04-14 DIAGNOSIS — J918 Pleural effusion in other conditions classified elsewhere: Secondary | ICD-10-CM | POA: Diagnosis not present

## 2022-04-14 DIAGNOSIS — R531 Weakness: Secondary | ICD-10-CM | POA: Diagnosis not present

## 2022-04-14 DIAGNOSIS — Z1152 Encounter for screening for COVID-19: Secondary | ICD-10-CM | POA: Diagnosis not present

## 2022-04-14 DIAGNOSIS — Z743 Need for continuous supervision: Secondary | ICD-10-CM | POA: Diagnosis not present

## 2022-04-14 DIAGNOSIS — I10 Essential (primary) hypertension: Secondary | ICD-10-CM | POA: Diagnosis not present

## 2022-04-14 DIAGNOSIS — R059 Cough, unspecified: Secondary | ICD-10-CM | POA: Diagnosis not present

## 2022-04-14 DIAGNOSIS — R0602 Shortness of breath: Secondary | ICD-10-CM | POA: Diagnosis not present

## 2022-04-14 DIAGNOSIS — R7989 Other specified abnormal findings of blood chemistry: Secondary | ICD-10-CM | POA: Diagnosis not present

## 2022-04-14 DIAGNOSIS — I7121 Aneurysm of the ascending aorta, without rupture: Secondary | ICD-10-CM | POA: Diagnosis not present

## 2022-04-21 DIAGNOSIS — I719 Aortic aneurysm of unspecified site, without rupture: Secondary | ICD-10-CM | POA: Diagnosis not present

## 2022-04-21 DIAGNOSIS — Z8673 Personal history of transient ischemic attack (TIA), and cerebral infarction without residual deficits: Secondary | ICD-10-CM | POA: Diagnosis not present

## 2022-04-30 DIAGNOSIS — R5381 Other malaise: Secondary | ICD-10-CM | POA: Diagnosis not present

## 2022-04-30 DIAGNOSIS — R531 Weakness: Secondary | ICD-10-CM | POA: Diagnosis not present

## 2022-06-19 DIAGNOSIS — Z8673 Personal history of transient ischemic attack (TIA), and cerebral infarction without residual deficits: Secondary | ICD-10-CM | POA: Diagnosis not present

## 2022-06-19 DIAGNOSIS — Z0000 Encounter for general adult medical examination without abnormal findings: Secondary | ICD-10-CM | POA: Diagnosis not present

## 2022-06-19 DIAGNOSIS — N182 Chronic kidney disease, stage 2 (mild): Secondary | ICD-10-CM | POA: Diagnosis not present

## 2022-06-19 DIAGNOSIS — K219 Gastro-esophageal reflux disease without esophagitis: Secondary | ICD-10-CM | POA: Diagnosis not present

## 2022-06-19 DIAGNOSIS — I719 Aortic aneurysm of unspecified site, without rupture: Secondary | ICD-10-CM | POA: Diagnosis not present

## 2022-07-02 DIAGNOSIS — T148XXA Other injury of unspecified body region, initial encounter: Secondary | ICD-10-CM | POA: Diagnosis not present

## 2022-07-02 DIAGNOSIS — W19XXXA Unspecified fall, initial encounter: Secondary | ICD-10-CM | POA: Diagnosis not present

## 2022-07-25 DIAGNOSIS — S0590XA Unspecified injury of unspecified eye and orbit, initial encounter: Secondary | ICD-10-CM | POA: Diagnosis not present

## 2022-07-25 DIAGNOSIS — W19XXXA Unspecified fall, initial encounter: Secondary | ICD-10-CM | POA: Diagnosis not present

## 2022-07-25 DIAGNOSIS — R5381 Other malaise: Secondary | ICD-10-CM | POA: Diagnosis not present

## 2022-10-02 DIAGNOSIS — Z0000 Encounter for general adult medical examination without abnormal findings: Secondary | ICD-10-CM | POA: Diagnosis not present

## 2022-10-02 DIAGNOSIS — N182 Chronic kidney disease, stage 2 (mild): Secondary | ICD-10-CM | POA: Diagnosis not present

## 2022-10-02 DIAGNOSIS — Z8673 Personal history of transient ischemic attack (TIA), and cerebral infarction without residual deficits: Secondary | ICD-10-CM | POA: Diagnosis not present

## 2022-10-02 DIAGNOSIS — I10 Essential (primary) hypertension: Secondary | ICD-10-CM | POA: Diagnosis not present

## 2022-10-02 DIAGNOSIS — I719 Aortic aneurysm of unspecified site, without rupture: Secondary | ICD-10-CM | POA: Diagnosis not present

## 2022-10-02 DIAGNOSIS — K219 Gastro-esophageal reflux disease without esophagitis: Secondary | ICD-10-CM | POA: Diagnosis not present

## 2022-11-07 DIAGNOSIS — I10 Essential (primary) hypertension: Secondary | ICD-10-CM | POA: Diagnosis not present

## 2022-11-07 DIAGNOSIS — Z880 Allergy status to penicillin: Secondary | ICD-10-CM | POA: Diagnosis not present

## 2022-11-07 DIAGNOSIS — R918 Other nonspecific abnormal finding of lung field: Secondary | ICD-10-CM | POA: Diagnosis not present

## 2022-11-07 DIAGNOSIS — W1839XA Other fall on same level, initial encounter: Secondary | ICD-10-CM | POA: Diagnosis not present

## 2022-11-07 DIAGNOSIS — J90 Pleural effusion, not elsewhere classified: Secondary | ICD-10-CM | POA: Diagnosis not present

## 2022-11-07 DIAGNOSIS — R531 Weakness: Secondary | ICD-10-CM | POA: Diagnosis not present

## 2022-11-07 DIAGNOSIS — M549 Dorsalgia, unspecified: Secondary | ICD-10-CM | POA: Diagnosis not present

## 2022-11-07 DIAGNOSIS — R6889 Other general symptoms and signs: Secondary | ICD-10-CM | POA: Diagnosis not present

## 2022-11-07 DIAGNOSIS — Z7401 Bed confinement status: Secondary | ICD-10-CM | POA: Diagnosis not present

## 2022-11-07 DIAGNOSIS — S2241XA Multiple fractures of ribs, right side, initial encounter for closed fracture: Secondary | ICD-10-CM | POA: Diagnosis not present

## 2022-11-07 DIAGNOSIS — Z743 Need for continuous supervision: Secondary | ICD-10-CM | POA: Diagnosis not present

## 2023-01-01 DIAGNOSIS — I10 Essential (primary) hypertension: Secondary | ICD-10-CM | POA: Diagnosis not present

## 2023-01-01 DIAGNOSIS — N1831 Chronic kidney disease, stage 3a: Secondary | ICD-10-CM | POA: Diagnosis not present

## 2023-01-01 DIAGNOSIS — I719 Aortic aneurysm of unspecified site, without rupture: Secondary | ICD-10-CM | POA: Diagnosis not present

## 2023-01-01 DIAGNOSIS — Z8673 Personal history of transient ischemic attack (TIA), and cerebral infarction without residual deficits: Secondary | ICD-10-CM | POA: Diagnosis not present

## 2023-01-01 DIAGNOSIS — K219 Gastro-esophageal reflux disease without esophagitis: Secondary | ICD-10-CM | POA: Diagnosis not present

## 2023-03-13 DIAGNOSIS — G8321 Monoplegia of upper limb affecting right dominant side: Secondary | ICD-10-CM | POA: Diagnosis not present

## 2023-03-26 DIAGNOSIS — G8321 Monoplegia of upper limb affecting right dominant side: Secondary | ICD-10-CM | POA: Diagnosis not present

## 2023-04-09 DIAGNOSIS — K219 Gastro-esophageal reflux disease without esophagitis: Secondary | ICD-10-CM | POA: Diagnosis not present

## 2023-04-09 DIAGNOSIS — I10 Essential (primary) hypertension: Secondary | ICD-10-CM | POA: Diagnosis not present

## 2023-04-09 DIAGNOSIS — Z8673 Personal history of transient ischemic attack (TIA), and cerebral infarction without residual deficits: Secondary | ICD-10-CM | POA: Diagnosis not present

## 2023-04-09 DIAGNOSIS — I719 Aortic aneurysm of unspecified site, without rupture: Secondary | ICD-10-CM | POA: Diagnosis not present

## 2023-04-09 DIAGNOSIS — N1831 Chronic kidney disease, stage 3a: Secondary | ICD-10-CM | POA: Diagnosis not present

## 2023-07-09 DIAGNOSIS — Z0000 Encounter for general adult medical examination without abnormal findings: Secondary | ICD-10-CM | POA: Diagnosis not present

## 2023-07-09 DIAGNOSIS — K219 Gastro-esophageal reflux disease without esophagitis: Secondary | ICD-10-CM | POA: Diagnosis not present

## 2023-07-09 DIAGNOSIS — I719 Aortic aneurysm of unspecified site, without rupture: Secondary | ICD-10-CM | POA: Diagnosis not present

## 2023-07-09 DIAGNOSIS — Z8673 Personal history of transient ischemic attack (TIA), and cerebral infarction without residual deficits: Secondary | ICD-10-CM | POA: Diagnosis not present

## 2023-07-09 DIAGNOSIS — N1831 Chronic kidney disease, stage 3a: Secondary | ICD-10-CM | POA: Diagnosis not present

## 2023-07-09 DIAGNOSIS — I10 Essential (primary) hypertension: Secondary | ICD-10-CM | POA: Diagnosis not present

## 2023-09-10 DIAGNOSIS — R531 Weakness: Secondary | ICD-10-CM | POA: Diagnosis not present

## 2023-09-10 DIAGNOSIS — Z743 Need for continuous supervision: Secondary | ICD-10-CM | POA: Diagnosis not present

## 2023-10-20 DIAGNOSIS — I719 Aortic aneurysm of unspecified site, without rupture: Secondary | ICD-10-CM | POA: Diagnosis not present

## 2023-10-20 DIAGNOSIS — Z8673 Personal history of transient ischemic attack (TIA), and cerebral infarction without residual deficits: Secondary | ICD-10-CM | POA: Diagnosis not present

## 2023-10-20 DIAGNOSIS — K219 Gastro-esophageal reflux disease without esophagitis: Secondary | ICD-10-CM | POA: Diagnosis not present

## 2023-10-20 DIAGNOSIS — N1831 Chronic kidney disease, stage 3a: Secondary | ICD-10-CM | POA: Diagnosis not present

## 2023-10-20 DIAGNOSIS — Z0000 Encounter for general adult medical examination without abnormal findings: Secondary | ICD-10-CM | POA: Diagnosis not present

## 2023-10-20 DIAGNOSIS — I10 Essential (primary) hypertension: Secondary | ICD-10-CM | POA: Diagnosis not present

## 2024-01-19 DIAGNOSIS — N1831 Chronic kidney disease, stage 3a: Secondary | ICD-10-CM | POA: Diagnosis not present

## 2024-01-19 DIAGNOSIS — Z8673 Personal history of transient ischemic attack (TIA), and cerebral infarction without residual deficits: Secondary | ICD-10-CM | POA: Diagnosis not present

## 2024-01-19 DIAGNOSIS — I719 Aortic aneurysm of unspecified site, without rupture: Secondary | ICD-10-CM | POA: Diagnosis not present

## 2024-01-19 DIAGNOSIS — I10 Essential (primary) hypertension: Secondary | ICD-10-CM | POA: Diagnosis not present

## 2024-01-19 DIAGNOSIS — K219 Gastro-esophageal reflux disease without esophagitis: Secondary | ICD-10-CM | POA: Diagnosis not present

## 2024-01-28 DIAGNOSIS — G8321 Monoplegia of upper limb affecting right dominant side: Secondary | ICD-10-CM | POA: Diagnosis not present

## 2024-02-14 DIAGNOSIS — Z7401 Bed confinement status: Secondary | ICD-10-CM | POA: Diagnosis not present

## 2024-02-14 DIAGNOSIS — R059 Cough, unspecified: Secondary | ICD-10-CM | POA: Diagnosis not present

## 2024-02-14 DIAGNOSIS — J9811 Atelectasis: Secondary | ICD-10-CM | POA: Diagnosis not present

## 2024-02-14 DIAGNOSIS — Z8673 Personal history of transient ischemic attack (TIA), and cerebral infarction without residual deficits: Secondary | ICD-10-CM | POA: Diagnosis not present

## 2024-02-14 DIAGNOSIS — J441 Chronic obstructive pulmonary disease with (acute) exacerbation: Secondary | ICD-10-CM | POA: Diagnosis not present

## 2024-02-14 DIAGNOSIS — Z7951 Long term (current) use of inhaled steroids: Secondary | ICD-10-CM | POA: Diagnosis not present

## 2024-02-14 DIAGNOSIS — R0600 Dyspnea, unspecified: Secondary | ICD-10-CM | POA: Diagnosis not present

## 2024-02-14 DIAGNOSIS — R29898 Other symptoms and signs involving the musculoskeletal system: Secondary | ICD-10-CM | POA: Diagnosis not present

## 2024-02-14 DIAGNOSIS — Z792 Long term (current) use of antibiotics: Secondary | ICD-10-CM | POA: Diagnosis not present

## 2024-02-14 DIAGNOSIS — I3139 Other pericardial effusion (noninflammatory): Secondary | ICD-10-CM | POA: Diagnosis not present

## 2024-02-14 DIAGNOSIS — R0602 Shortness of breath: Secondary | ICD-10-CM | POA: Diagnosis not present

## 2024-02-14 DIAGNOSIS — R918 Other nonspecific abnormal finding of lung field: Secondary | ICD-10-CM | POA: Diagnosis not present

## 2024-02-14 DIAGNOSIS — I4891 Unspecified atrial fibrillation: Secondary | ICD-10-CM | POA: Diagnosis not present

## 2024-02-14 DIAGNOSIS — I10 Essential (primary) hypertension: Secondary | ICD-10-CM | POA: Diagnosis not present

## 2024-02-14 DIAGNOSIS — Z880 Allergy status to penicillin: Secondary | ICD-10-CM | POA: Diagnosis not present

## 2024-02-14 DIAGNOSIS — J9601 Acute respiratory failure with hypoxia: Secondary | ICD-10-CM | POA: Diagnosis not present

## 2024-02-14 DIAGNOSIS — I69351 Hemiplegia and hemiparesis following cerebral infarction affecting right dominant side: Secondary | ICD-10-CM | POA: Diagnosis not present

## 2024-02-14 DIAGNOSIS — I509 Heart failure, unspecified: Secondary | ICD-10-CM | POA: Diagnosis not present

## 2024-02-14 DIAGNOSIS — Z79899 Other long term (current) drug therapy: Secondary | ICD-10-CM | POA: Diagnosis not present

## 2024-02-14 DIAGNOSIS — R54 Age-related physical debility: Secondary | ICD-10-CM | POA: Diagnosis not present

## 2024-02-14 DIAGNOSIS — R0902 Hypoxemia: Secondary | ICD-10-CM | POA: Diagnosis not present

## 2024-02-14 DIAGNOSIS — Z9981 Dependence on supplemental oxygen: Secondary | ICD-10-CM | POA: Diagnosis not present

## 2024-02-14 DIAGNOSIS — J40 Bronchitis, not specified as acute or chronic: Secondary | ICD-10-CM | POA: Diagnosis not present

## 2024-02-15 NOTE — Nursing Note (Addendum)
 Patient called out asking to go to the bathroom. Patient on cardiac monitor and oxygen . I brought patient a bedside commode for safety and paramedic Oneil  came with me to assist patient to bedside commode. Patient refused to use bedside commode and told us  he was not going to use it at this time. Patient also reported he is paralyzed on his right side but is still able to ambulate. Patient offered bed pan but patient refused. Patient states he is not going to use the bathroom right now and took his hand and waved it for us  to leave his room stating he is not going to the bathroom right now.

## 2024-02-15 NOTE — Nursing Note (Signed)
 Patient daughter at bedside. Patient asked to use the bedside commode. Patient assisted up to bedside commode assisted X2. Patient unable to transfer from bed to bedside commode unassisted. Patient tolerated well with assistance X2. Patient given call bell to call out when finished.

## 2024-02-15 NOTE — Nursing Note (Signed)
   02/15/24 1124  Rapid Rounds  Attendance Nursing;Physician;Advanced Practice Provider (APP);Case Manager;Dietitian/Nutrition Services;Pharmacy;Social Work/Services  Expected Discharge Disposition Home  Today we still await: Clinical stability

## 2024-02-15 NOTE — Discharge Summary (Signed)
 Hospitalist Discharge Summary   Discharge date:   February 15, 2024 Length of stay:    LOS: 1 day    Discharge Service:   [facility] Hospitalists Discharge Attending Physician: Margart Elsie Dragon, DO Discharge to:    To Home Condition at Discharge:  fair   Mental Status On day of Discharge:  The patient is Alert and oriented to PERSON The patient is Alert And oriented to TIME The patient is Alert and oriented to [facility] course based on timeline of significant events after admission (by date):  Patient improved quicker than expected, able to be discharged home safely on hospital day #1 with home O2.  Encouraged patient to discuss possible echocardiogram on an outpatient basis with PCP although CT findings suggest shortness of breath acutely is more likely related to small airway disease/bronchitis. ______________________________________   Admission HPI    Patient admitted on: 02/14/2024  2:14 PM  Patient admitted by: Margart Elsie Dragon, DO    CHIEF COMPLAINT: Shortness of breath, cough, congestion for the last 4 days   Day of admission HPI:  [name]  is a 73 y.o. male with a PMH significant for hypertension and previous stroke with right-sided paralysis who presented from home with increased cough and congestion for the past 4 days with some increase in lower extremity swelling.  Patient denies any previous history of CHF.  PCP is Dr. Orpha.  Patient denies any fever or chills, no recent sick contacts.  Chest x-ray with no focal consolidations, CT chest reveals probable small airway disease.  Patient was given IV Solu-Medrol , nebulizer treatments, and IV Lasix in the ED and referred for admission.  Patient lives at home uses a crutch to ambulate due to his right sided paralysis.   Patient admitted on Home O2? - no Patient on home anticoagulant? -  no Patient admitted with Chronic home foley catheter? - no Foley catheter placed or replaced by another service  prior to admission? - no Central Line Status: NONE   Mental Status on Admission: The patient is Alert and oriented to PERSON The patient is Alert And oriented to TIME The patient is Alert and oriented to LOCATION   Problem List, Assessment & Plan     ASSESSMENT & PLAN (In order of descending acuity)   Acute exacerbation of COPD/acute hypoxic respiratory failure, - No further accessory muscle use while on 2 L - Patient never formally diagnosed with COPD, reports he is not a current/regular smoker - Scattered wheezes throughout bilateral lung fields - Continue Symbicort inhaler at discharge - Encourage continued aggressive pulmonary hygiene with incentive spirometer - Continue corticosteroids, wean to prednisone daily 40 mg for 5 days, patient did qualify for oxygen  to use as needed, he did desaturate to the mid 80s on room air, will continue doxycycline twice daily x 5 days - Monitor respiratory status closely   Age-related physical debility/previous CVA with right-sided paralysis - Continue home medications - Will hold off on PT for now, patient reports strength is at his baseline and he walks with a cane at home   Questionable CHF Unlikely at this point, though worth further exploration outpatient -Patient has other findings on CT scan would be the primary reason for shortness of breath including small airway disease and mucous plugging, we discussed outpatient evaluation with echocardiogram which he will talk further with his PCP about.    Incidental Findings for ourpatient Follow-Up: No significant incidental findings present    ADDITIONAL NON-ACUTE FINDINGS, OBSERVATIONS, FAMILY  DISCUSSIONS, ETC. (When present):   Alert and oriented 73 year old male, right sided paralysis (chronic) Lungs still with bilateral expiratory wheezes although somewhat improved from previous exam, patient in no acute distress on 2 L Heart tachycardic, regular rhythm, no murmur Abdomen soft,  nontender, distended Extremities with minimal peripheral edema, worse in right lower extremity which is patient's dependent leg from previous CVA   DVT Prophylaxis Ordered: SQ Enoxaparin   _____________________________________  Vital Signs: BP 116/73   Pulse 106   Temp 36.7 C (98 F) (Oral)   Resp 17   Ht 172.7 cm (5' 8)   Wt 93.4 kg (206 lb)   SpO2 (!) 87%   BMI 31.32 kg/m   Nutrition:                                  Diet Instructions     Discharge diet (specify)     Discharge Nutrition Therapy: Heart Healthy                      CODE STATUS :                    Full Code   Patient discharged on Home O2? - yes Patient discharged on home anticoagulant? -  no Patient discharged with Chronic home foley catheter? - no  Time Spent on Discharge I spent greater than 30 minutes counseling and coordinating care for the discharge of this patient. The patient, family members in room, and I discussed the importance of outpatient follow-up as well as concerning signs and symptoms that would require immediate evaluation by a medical professional. The aforementioned conversation participants understand  and did show insight. I did use teachback to ensure understanding. The above participant/s is aware that not following the discussed plan, recommendations, and follow up can lead to severe negative effects on the patient's health, up to and including death.    Discharge Medications     Your Medication List     START taking these medications    budesonide-formoterol 160-4.5 mcg/actuation inhaler Commonly known as: SYMBICORT Inhale 2 puffs in the morning and 2 puffs before bedtime.   doxycycline 100 MG tablet Commonly known as: VIBRA-TABS Take 1 tablet (100 mg total) by mouth two (2) times a day for 5 days.   predniSONE 20 MG tablet Commonly known as: DELTASONE Take 2 tablets (40 mg total) by mouth daily for 5 days.       CONTINUE taking these medications     ALPRAZolam  0.25 MG tablet Commonly known as: XANAX  Take 1 tablet (0.25 mg total) by mouth Three (3) times a day. PT takes 1 in AM and 2 in PM   amlodipine  10 MG tablet Commonly known as: NORVASC  Take 1 tablet (10 mg total) by mouth daily.   cloNIDine  HCL 0.2 MG tablet Commonly known as: CATAPRES  Take 1 tablet (0.2 mg total) by mouth two (2) times a day.   labetalol  300 MG tablet Commonly known as: NORMODYNE  Take 1 tablet (300 mg total) by mouth two (2) times a day.   olmesartan 40 MG tablet Commonly known as: BENICAR Take 1 tablet (40 mg total) by mouth daily.   PARoxetine 10 MG tablet Commonly known as: PAXIL Take 1 tablet (10 mg total) by mouth daily.   pregabalin 75 MG capsule Commonly known as: LYRICA Take 1 capsule (75 mg total) by mouth two (2) times  a day.   rosuvastatin 20 MG tablet Commonly known as: CRESTOR Take 1 tablet (20 mg total) by mouth daily.       ____________________________________________  Discharge Instructions   Nutrition:                                  Diet Instructions     Discharge diet (specify)     Discharge Nutrition Therapy: Heart Healthy       Activity:                                   Activity Instructions     Activity as tolerated         Appointments:                         Appointments which have been scheduled for you    Mar 30, 2024 11:00 AM DEXA Bone Density with PIERCE 618 DEXA RM IMG DEXA WIC [facility]) [address] [place] [address] [phone]  No calcium supplements 24 hrs prior.         Follow Up:                              Follow Up instructions and Outpatient Referrals    Call MD for:  difficulty breathing, headache or visual disturbances     Call MD for:  extreme fatigue     Call MD for:  persistent dizziness or light-headedness     Call MD for:  persistent nausea or vomiting     Call MD for:  severe uncontrolled pain     Call MD for: Temperature > 38.5 Celsius  ( > 101.3 Fahrenheit)     Discharge instructions         Allergies  Allergies[1]    Past Medical History  Past Medical History[2]     Lab Results   Recent Labs    Units 02/14/24 1453 02/15/24 0603  WBC 10*9/L 8.0 4.2  HGB g/dL 87.2 87.2  HCT % 60.8 59.5  PLT 10*9/L 107* 101*   Recent Labs    Units 02/14/24 1453 02/15/24 0603  NA mmol/L 137 138  K mmol/L 3.9 3.9  CL mmol/L 99 98  CO2 mmol/L 29.0 29.8  BUN mg/dL 17 19  CREATININE mg/dL 8.68* 8.70  GLU mg/dL 898 849  CALCIUM mg/dL 9.0 8.5  ALBUMIN g/dL 3.5 3.1*  PROT g/dL 7.9 6.9  BILITOT mg/dL 1.0 0.8  AST U/L 22 16  ALT U/L 20 19  ALKPHOS U/L 103 91  LACTATE mmol/L 0.7  --    No results for input(s): CKTOTAL, TROPONINI, TROPONINT, CKMB, EDTPNI, BNP, CHOL, LDL, HDL, TRIG in the last 168 hours. No results for input(s): INR, LABPROT, APTT, DDIMER in the last 168 hours. No results for input(s): TSH, ETOH, ACETAMIN, SALICYLATE, FREET3, T4FREE, A1C, TTR, ESR, CRP, HSCRP, ANA, RF, VITAMINB12, FOLATE, IRON, LABIRON, TIBC, FERRITIN, RETIC, RETICCTPCT, C3, C4, LDH, HAPTM, URICACID, HEPP4, CEA, RAPSCRN, CDIFRPCR, CDIFFNAP1, HIV12SCRN, HIVCP, TBCELLQN in the last 168 hours.  Invalid input(s): HIVSCRN  No results for input(s): HEPAIGM, HEPBSAG, HEPBIGM, HEPCAB, MITOAB in the last 168 hours. Recent Labs    Units 02/14/24 1543  WBCUA /HPF 3  NITRITE  Negative  LEUKOCYTESUR  Negative  BACTERIA /HPF None Seen  RBCUA /HPF 2  BLOODU  Trace*  GLUCOSEU  Negative  PROTEINUA  100 mg/dL*  KETONESU  Negative   No results for input(s): KETUR, PREGTESTUR, PREGPOC, NAURINE, LABOSMO, OSMOFT, PROTEINUR, CREATUR, PCRATIOUR, OPIAU, BENZU, TRICYCLIC, PCPU, AMPHU, COCAU, CANNAU, BARBU, URPROTELEC in the last 168 hours. No results for input(s): FTYP1, WBCFLUID, FNEUT, LYMPHSFL, FMONO,  EOSFL, RBCFL, CLARITYFLUID, COLORFL in the last 168 hours. Recent Labs    02/14/24 1501  PHART 7.38  PCO2ART 46.7  PO2ART 58*  HCO3ART 27.4*  O2SATART 89.5*  BEART 1.6    Imaging  CTA Chest W Contrast Result Date: 02/14/2024 Exam:  CT Angiogram Chest (Pulmonary Embolism Protocol)  History:  Hypoxia. Evaluate for pulmonary embolism or edema  Technique: Chest CTA with IV contrast (pulmonary embolism protocol). This examination was specifically tailored to evaluate the pulmonary arteries for the presence of intraluminal thrombus.  Volumetric axial CT Maximum Intensity Projection (MIP) pulmonary angiographic images were generated at the scanner and sent to PACS for review. AEC (automated exposure control) and/or manual techniques such as size-specific kV and mAs are employed where appropriate to reduce radiation exposure for all CT exams.  Comparison:  November 07, 2022, April 14, 2022  Chest CT Findings:  PULMONARY ARTERIES: Limited evaluation secondary to respiratory motion and poor contrast bolus. There is no evidence of central or segmental pulmonary embolism. Evaluation of the subsegmental branches is compromised by respiratory motion and contrast bolus.  LUNGS: The central airways demonstrate significant filling defect within the posterior lateral aspects of the trachea extending into the main bronchi, consistent in appearance with secretions. There is bronchial wall thickening and areas of mucous plugging in the bilateral lung bases, concerning for bronchitis. The lung parenchyma demonstrates patchy mosaic attenuation which may reflect small airway disease. Dependent atelectasis in the bases. No definite evidence of edema. No lobar consolidation. No suspicious nodules.  PLEURA: No pleural effusion or pneumothorax.  HEART: Heart size is enlarged, but stable. Small pericardial effusion. Mild coronary artery calcifications.  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 Moderate atherosclerosis in the transverse and descending thoracic aorta. No dissection.  MEDIASTINUM: Scattered subcentimeter mediastinal lymph nodes, similar to prior examination and likely reactive.  BONES: Prior cervical spine fusion. Mild degenerative changes throughout the thoracic spine. No aggressive lytic or sclerotic lesions. Old, healed right-sided rib fractures.  SOFT TISSUES: Normal.  UPPER ABDOMEN: Limited view of the upper abdomen is unremarkable. Lobulated renal cortices bilaterally. Nonobstructing punctate calculus in the left mid kidney.    1.    Limited examination secondary to respiratory motion and contrast bolus. No evidence of central or segmental pulmonary embolism. 2.    Bronchial wall thickening and mucous plugging in the bilateral lower lobes, concerning for bronchitis. 3.    Mosaic attenuation in the lungs, suggesting small airway disease. 4.    Cardiomegaly with small pericardial effusion. 5.    Atherosclerotic disease with coronary calcifications and aneurysmal dilatation of the ascending thoracic aorta measuring up to 4.1 cm in the mid ascending portion, stable.  Signed (Electronic Signature): 02/14/2024 4:51 PM Signed By: Deward DELENA Brock, MD  ECG 12 Lead Result Date: 02/14/2024 Atrial fibrillation Right axis deviation Possible Anterior infarct , age undetermined Abnormal ECG No previous ECGs available Confirmed by Cherie Searle ([id number]) on 02/14/2024 4:29:21 PM  XR Chest Portable Result Date: 02/14/2024 Exam:  Portable Chest  History:  Dyspnea,  Technique:  Single frontal view.  Comparison:  Chest CT November 07, 2022  Findings:   The lungs and pleural space are clear. Heart size is upper normal but stable. Mediastinal contours and bones are unchanged.    No acute cardiopulmonary disease.  Signed (Electronic Signature): 02/14/2024 3:41 PM Signed By: Deward DELENA Brock, MD   Margart Dragon, DO Hospitalist,  [facility] 02/15/24, 1:20 PM       [1] Allergies Allergen Reactions  . Motrin [Ibuprofen]   . Penicillins   [2] Past Medical History: Diagnosis Date  . Hypertension   . Stroke    (CMS-HCC)

## 2024-02-15 NOTE — Nursing Note (Signed)
   02/15/24 1019  General  Care Manager / Social Worker assessed the patient by  In person interview with patient  Orientation Level Oriented X4  Functional level prior to admission Partially Assisted  Who provides care at home? Family member (granddaughter)  Level of assistance required Bathing;Grooming  Reason for referral Discharge Planning;Home Health;Placement  Advance Directive (Medical Treatment)  Does patient have an advance directive covering medical treatment? Patient does not have advance directive covering medical treatment.  Reason patient does not have an advance directive covering medical treatment: Patient does not wish to complete one at this time.  Health Care Decision Maker [HCDM] (Medical & Mental Health Treatment)  Healthcare Decision Maker HCDM documented in the HCDM/Contact Info section.  Advance Directive (Mental Health Treatment)  Does patient have an advance directive covering mental health treatment? Patient does not have advance directive covering mental health treatment.  Reason patient does not have an advance directive covering mental health treatment: Patient does not wish to complete one at this time.  Readmission Assessment  Have you been hospitalized in the last 30 days? No  Patient Information   Lives with  Spouse/significant other  Type of Residence Private residence  Location/Detail see demo  Support Systems/Concerns Family Members;Friends/Neighbors;Significant Other;Children  Responsibilities/Dependents at home? No  Home Care services in place prior to admission? No  Equipment Currently Used at Home cane, straight;walker, rolling;wheelchair, power  Currently receiving outpatient dialysis? No  Financial Information  Need for financial assistance? No  Discharge Needs Assessment  Concerns to be Addressed discharge planning  Clinical risk factors > 65;New Diagnosis;Multiple Diagnoses (Chronic);Functional Limitations;Visual or Hearing Impaired   Barriers to taking medications No  Prior overnight hospital stay or ED visit in last 90 days No  Anticipated Changes Related to Illness none  Equipment Needed After Discharge none  Patient at risk for readmission? No  Discharge Plan  Screen findings are Discharge planning needs identified or anticipated (Comment).  Expected Transfer From Critical Care 02/17/24  Quality data for continuing care services shared with patient and/or representative? Yes  Patient and/or family were provided with choice of facilities / services that are available and appropriate to meet post hospital care needs? Yes   List choices in order highest to lowest preferred, if applicable.  declines HH  Initial Assessment complete? Yes

## 2024-02-15 NOTE — Nursing Note (Signed)
   02/15/24 1130  Final Assessment  Patient's Post Acute Contact Information see demo  Has a PCP appointment been made? No  Has a specialist appointment been made? No  Post Acute Facility needed at discharge? No  Home Care/ Home Medical Equipment needed at discharge? No (patient refused referral to [facility])  Outpatient/Community Referrals needed for discharge? No  Currently receiving outpatient dialysis? No  Discharge Disposition Home w/ Self Care  Transportation Anticipated family or friend will provide  Quality data for continuing care services shared with patient and/or representative? Yes  Patient and/or family were provided with choice of facilities / services that are available and appropriate to meet post hospital care needs? Yes  IMM Delivery  Follow Up Important Message (IM) letter given to patient and/or family? N/A  Final Assessment Complete  Final Assessment Complete Yes

## 2024-02-15 NOTE — Nursing Note (Signed)
 TOC from [employer] delivered to the room and instructed patient on use. EMS called and patient on list for pick up

## 2024-02-15 NOTE — Nursing Note (Signed)
 Patient assisted in getting his shirt on for discharge at his request. Patient Pants in bag at bedside. Patient pants from home are wet. I have supplied patient a pair of disposable pants. Pants will be put on when EMS arrives. Patient still hooked up to pure wick.

## 2024-02-15 NOTE — Nursing Note (Signed)
 Oxygen  saturation on room air with patient at rest  = 91% Oxygen  saturation on room air with exertion / ambulation = 93%

## 2024-02-15 NOTE — Nursing Note (Signed)
 Oxygen  saturation on room air with patient at rest  = 86% Oxygen  saturation on room air with exertion = 86% Oxygen  saturation with exertion on oxygen  = 94% on 2 lpm

## 2024-02-16 ENCOUNTER — Telehealth: Payer: Self-pay

## 2024-02-16 NOTE — Transitions of Care (Post Inpatient/ED Visit) (Signed)
   02/16/2024  Name: [name] MRN: [id number] DOB: 07-30-1950  Today's TOC FU Call Status: Today's TOC FU Call Status:: Unsuccessful Call (1st Attempt) Unsuccessful Call (1st Attempt) Date: 02/16/24  Attempted to reach the patient regarding the most recent Inpatient/ED visit.  Follow Up Plan: Additional outreach attempts will be made to reach the patient to complete the Transitions of Care (Post Inpatient/ED visit) call.   Leola Fiore J. Nissa Stannard RN, MSN [facility], [facility] RN Care Manager Direct Dial: [phone]  Fax: [phone] Website: [email or web address]

## 2024-02-19 ENCOUNTER — Telehealth: Payer: Self-pay

## 2024-02-19 NOTE — Transitions of Care (Post Inpatient/ED Visit) (Signed)
   02/19/2024  Name: KIP CROPP MRN: [id number] DOB: 11-Oct-1950  Today's TOC FU Call Status: Today's TOC FU Call Status:: Unsuccessful Call (2nd Attempt) Unsuccessful Call (2nd Attempt) Date: 02/19/24  Attempted to reach the patient regarding the most recent Inpatient/ED visit.  Follow Up Plan: Additional outreach attempts will be made to reach the patient to complete the Transitions of Care (Post Inpatient/ED visit) call.   Shermeka Rutt J. Delontae Lamm RN, MSN [facility], [facility] Health RN Care Manager Direct Dial: [phone]  Fax: [phone] Website: [email or web address]

## 2024-02-22 ENCOUNTER — Telehealth: Payer: Self-pay

## 2024-02-22 NOTE — Transitions of Care (Post Inpatient/ED Visit) (Signed)
   02/22/2024  Name: [name] MRN: [id number] DOB: 04-26-1951  Today's TOC FU Call Status: Today's TOC FU Call Status:: Unsuccessful Call (3rd Attempt) Unsuccessful Call (3rd Attempt) Date: 02/22/24  Attempted to reach the patient regarding the most recent Inpatient/ED visit.  Follow Up Plan: No further outreach attempts will be made at this time. We have been unable to contact the patient.  Sholom Dulude J. Pierrette Scheu RN, MSN [facility], [facility] Health RN Care Manager Direct Dial: [phone]  Fax: [phone] Website: [email or web address]

## 2024-04-27 ENCOUNTER — Encounter (INDEPENDENT_AMBULATORY_CARE_PROVIDER_SITE_OTHER): Payer: Self-pay | Admitting: *Deleted
